# Patient Record
Sex: Male | Born: 1935 | Race: White | Hispanic: No | Marital: Married | Smoking: Never smoker
Health system: Southern US, Community
[De-identification: ages and names within clinical notes are randomized; demographics above are authoritative.]

## PROBLEM LIST (undated history)

## (undated) DIAGNOSIS — J302 Other seasonal allergic rhinitis: Secondary | ICD-10-CM

## (undated) DIAGNOSIS — E785 Hyperlipidemia, unspecified: Secondary | ICD-10-CM

## (undated) DIAGNOSIS — B019 Varicella without complication: Secondary | ICD-10-CM

## (undated) DIAGNOSIS — J189 Pneumonia, unspecified organism: Secondary | ICD-10-CM

## (undated) DIAGNOSIS — M1990 Unspecified osteoarthritis, unspecified site: Secondary | ICD-10-CM

## (undated) HISTORY — PX: TONSILLECTOMY: SUR1361

## (undated) HISTORY — DX: Unspecified osteoarthritis, unspecified site: M19.90

## (undated) HISTORY — DX: Other seasonal allergic rhinitis: J30.2

## (undated) HISTORY — DX: Pneumonia, unspecified organism: J18.9

## (undated) HISTORY — DX: Hyperlipidemia, unspecified: E78.5

## (undated) HISTORY — DX: Varicella without complication: B01.9

---

## 2012-04-18 DIAGNOSIS — H2510 Age-related nuclear cataract, unspecified eye: Secondary | ICD-10-CM | POA: Diagnosis not present

## 2012-07-18 DIAGNOSIS — L259 Unspecified contact dermatitis, unspecified cause: Secondary | ICD-10-CM | POA: Diagnosis not present

## 2012-07-18 DIAGNOSIS — L821 Other seborrheic keratosis: Secondary | ICD-10-CM | POA: Diagnosis not present

## 2012-09-11 DIAGNOSIS — Z23 Encounter for immunization: Secondary | ICD-10-CM | POA: Diagnosis not present

## 2012-09-19 DIAGNOSIS — M109 Gout, unspecified: Secondary | ICD-10-CM | POA: Diagnosis not present

## 2012-09-19 DIAGNOSIS — E7800 Pure hypercholesterolemia, unspecified: Secondary | ICD-10-CM | POA: Diagnosis not present

## 2013-04-07 DIAGNOSIS — H2510 Age-related nuclear cataract, unspecified eye: Secondary | ICD-10-CM | POA: Diagnosis not present

## 2013-06-10 DIAGNOSIS — R21 Rash and other nonspecific skin eruption: Secondary | ICD-10-CM | POA: Diagnosis not present

## 2013-08-29 DIAGNOSIS — Z23 Encounter for immunization: Secondary | ICD-10-CM | POA: Diagnosis not present

## 2013-09-01 DIAGNOSIS — Z85828 Personal history of other malignant neoplasm of skin: Secondary | ICD-10-CM | POA: Diagnosis not present

## 2013-09-01 DIAGNOSIS — D485 Neoplasm of uncertain behavior of skin: Secondary | ICD-10-CM | POA: Diagnosis not present

## 2013-09-01 DIAGNOSIS — C44320 Squamous cell carcinoma of skin of unspecified parts of face: Secondary | ICD-10-CM | POA: Diagnosis not present

## 2013-09-01 DIAGNOSIS — L570 Actinic keratosis: Secondary | ICD-10-CM | POA: Diagnosis not present

## 2013-09-01 DIAGNOSIS — L821 Other seborrheic keratosis: Secondary | ICD-10-CM | POA: Diagnosis not present

## 2013-09-01 DIAGNOSIS — D1801 Hemangioma of skin and subcutaneous tissue: Secondary | ICD-10-CM | POA: Diagnosis not present

## 2013-09-08 DIAGNOSIS — Z85828 Personal history of other malignant neoplasm of skin: Secondary | ICD-10-CM | POA: Diagnosis not present

## 2013-09-08 DIAGNOSIS — C44320 Squamous cell carcinoma of skin of unspecified parts of face: Secondary | ICD-10-CM | POA: Diagnosis not present

## 2013-10-07 DIAGNOSIS — R198 Other specified symptoms and signs involving the digestive system and abdomen: Secondary | ICD-10-CM | POA: Diagnosis not present

## 2013-10-07 DIAGNOSIS — R110 Nausea: Secondary | ICD-10-CM | POA: Diagnosis not present

## 2013-10-07 DIAGNOSIS — K219 Gastro-esophageal reflux disease without esophagitis: Secondary | ICD-10-CM | POA: Diagnosis not present

## 2013-10-07 DIAGNOSIS — Z1211 Encounter for screening for malignant neoplasm of colon: Secondary | ICD-10-CM | POA: Diagnosis not present

## 2013-10-07 DIAGNOSIS — K5900 Constipation, unspecified: Secondary | ICD-10-CM | POA: Diagnosis not present

## 2013-10-30 DIAGNOSIS — K219 Gastro-esophageal reflux disease without esophagitis: Secondary | ICD-10-CM | POA: Diagnosis not present

## 2013-10-30 DIAGNOSIS — Z1211 Encounter for screening for malignant neoplasm of colon: Secondary | ICD-10-CM | POA: Diagnosis not present

## 2013-10-30 DIAGNOSIS — R198 Other specified symptoms and signs involving the digestive system and abdomen: Secondary | ICD-10-CM | POA: Diagnosis not present

## 2013-10-30 DIAGNOSIS — K449 Diaphragmatic hernia without obstruction or gangrene: Secondary | ICD-10-CM | POA: Diagnosis not present

## 2013-10-30 DIAGNOSIS — R110 Nausea: Secondary | ICD-10-CM | POA: Diagnosis not present

## 2013-12-23 DIAGNOSIS — M109 Gout, unspecified: Secondary | ICD-10-CM | POA: Diagnosis not present

## 2014-03-16 DIAGNOSIS — L821 Other seborrheic keratosis: Secondary | ICD-10-CM | POA: Diagnosis not present

## 2014-03-16 DIAGNOSIS — D1801 Hemangioma of skin and subcutaneous tissue: Secondary | ICD-10-CM | POA: Diagnosis not present

## 2014-03-16 DIAGNOSIS — Z85828 Personal history of other malignant neoplasm of skin: Secondary | ICD-10-CM | POA: Diagnosis not present

## 2014-03-27 DIAGNOSIS — H2510 Age-related nuclear cataract, unspecified eye: Secondary | ICD-10-CM | POA: Diagnosis not present

## 2014-03-27 DIAGNOSIS — H3530 Unspecified macular degeneration: Secondary | ICD-10-CM | POA: Diagnosis not present

## 2014-06-01 DIAGNOSIS — B351 Tinea unguium: Secondary | ICD-10-CM | POA: Diagnosis not present

## 2014-06-01 DIAGNOSIS — R338 Other retention of urine: Secondary | ICD-10-CM | POA: Diagnosis not present

## 2014-06-01 DIAGNOSIS — N410 Acute prostatitis: Secondary | ICD-10-CM | POA: Diagnosis not present

## 2015-03-15 DIAGNOSIS — Z85828 Personal history of other malignant neoplasm of skin: Secondary | ICD-10-CM | POA: Diagnosis not present

## 2015-03-15 DIAGNOSIS — D1801 Hemangioma of skin and subcutaneous tissue: Secondary | ICD-10-CM | POA: Diagnosis not present

## 2015-03-15 DIAGNOSIS — L812 Freckles: Secondary | ICD-10-CM | POA: Diagnosis not present

## 2015-03-15 DIAGNOSIS — L821 Other seborrheic keratosis: Secondary | ICD-10-CM | POA: Diagnosis not present

## 2015-03-15 DIAGNOSIS — L570 Actinic keratosis: Secondary | ICD-10-CM | POA: Diagnosis not present

## 2015-03-15 DIAGNOSIS — D692 Other nonthrombocytopenic purpura: Secondary | ICD-10-CM | POA: Diagnosis not present

## 2015-03-31 DIAGNOSIS — T1511XA Foreign body in conjunctival sac, right eye, initial encounter: Secondary | ICD-10-CM | POA: Diagnosis not present

## 2015-03-31 DIAGNOSIS — H2513 Age-related nuclear cataract, bilateral: Secondary | ICD-10-CM | POA: Diagnosis not present

## 2015-07-12 DIAGNOSIS — Z85828 Personal history of other malignant neoplasm of skin: Secondary | ICD-10-CM | POA: Diagnosis not present

## 2015-07-12 DIAGNOSIS — L239 Allergic contact dermatitis, unspecified cause: Secondary | ICD-10-CM | POA: Diagnosis not present

## 2015-08-13 DIAGNOSIS — Z85828 Personal history of other malignant neoplasm of skin: Secondary | ICD-10-CM | POA: Diagnosis not present

## 2015-08-13 DIAGNOSIS — L259 Unspecified contact dermatitis, unspecified cause: Secondary | ICD-10-CM | POA: Diagnosis not present

## 2015-08-13 DIAGNOSIS — L308 Other specified dermatitis: Secondary | ICD-10-CM | POA: Diagnosis not present

## 2015-08-27 DIAGNOSIS — Z85828 Personal history of other malignant neoplasm of skin: Secondary | ICD-10-CM | POA: Diagnosis not present

## 2015-08-30 DIAGNOSIS — Z23 Encounter for immunization: Secondary | ICD-10-CM | POA: Diagnosis not present

## 2015-11-17 DIAGNOSIS — H532 Diplopia: Secondary | ICD-10-CM | POA: Diagnosis not present

## 2015-11-18 ENCOUNTER — Ambulatory Visit (INDEPENDENT_AMBULATORY_CARE_PROVIDER_SITE_OTHER): Payer: Medicare Other | Admitting: Neurology

## 2015-11-18 ENCOUNTER — Encounter: Payer: Self-pay | Admitting: Neurology

## 2015-11-18 VITALS — BP 129/67 | HR 63 | Ht 70.0 in | Wt 181.4 lb

## 2015-11-18 DIAGNOSIS — H5711 Ocular pain, right eye: Secondary | ICD-10-CM

## 2015-11-18 DIAGNOSIS — H532 Diplopia: Secondary | ICD-10-CM

## 2015-11-18 DIAGNOSIS — R519 Headache, unspecified: Secondary | ICD-10-CM

## 2015-11-18 DIAGNOSIS — R51 Headache: Secondary | ICD-10-CM

## 2015-11-18 NOTE — Patient Instructions (Signed)
Overall you are doing fairly well but I do want to suggest a few things today:   Remember to drink plenty of fluid, eat healthy meals and do not skip any meals. Try to eat protein with a every meal and eat a healthy snack such as fruit or nuts in between meals. Try to keep a regular sleep-wake schedule and try to exercise daily, particularly in the form of walking, 20-30 minutes a day, if you can.   As far as diagnostic testing: MRi of the brain, labs  I would like to see you back in 3 months, sooner if we need to. Please call us with any interim questions, concerns, problems, updates or refill requests.   Our phone number is [phone]. We also have an after hours call service for urgent matters and there is a physician on-call for urgent questions. For any emergencies you know to call 911 or go to the nearest emergency room

## 2015-11-18 NOTE — Progress Notes (Signed)
[facility]    Provider:  Dr Jaynee Eagles Referring Provider: Melissa Noon, OD Primary Care Physician:  Woody Seller, MD  CC:  diplopia  HPI:  Allen Robinson is a 81 y.o. male here as a referral from Dr. Delman Cheadle for double vision. PMHx HTN. He woke up with double vision. He woke up with it yesterday morning. No inciting events, no head trauma, no falls. He has discomfort on the top of the eye that started yesterday. One on top of the other images. Maybe better today? Or maybe he is used to it. Continous. If he moves his head to a certain positon it resolves it. Only with both eyes open. No pain with eye movements. No headache. One on top of the other. Better with certain positions. He has some ear pain which is chronic No jaw pain. A little temple pain on the right. No other focal neurologic deficits.  Reviewed notes, labs and imaging from outside physicians, which showed: Reviewed notes from ophthalmology which showed OD 20/25 OS 20/20. He is on simvastatin and aspirin. No diabetes and no glaucoma. Oriented 3. Negative fatigable test, did not suspect myasthenia gravis.   Review of Systems: Patient complains of symptoms per HPI as well as the following symptoms: Double vision, eye pain, hearing loss. Pertinent negatives per HPI. All others negative.   Social History   Social History  . Marital Status: Married    Spouse Name: Allen Robinson  . Number of Children: 2  . Years of Education: 16   Occupational History  . Not on file.   Social History Main Topics  . Smoking status: Never Smoker   . Smokeless tobacco: Not on file  . Alcohol Use: 0.0 oz/week    0 Standard drinks or equivalent per week     Comment: socially  . Drug Use: No  . Sexual Activity: Not on file   Other Topics Concern  . Not on file   Social History Narrative   Lives with wife   Caffeine use: 1 cup coffee in the morning    Family History  Problem Relation Age of Onset  . Stroke Neg Hx   .  Neuropathy Neg Hx     Past Medical History  Diagnosis Date  . Hypertension     Past Surgical History  Procedure Laterality Date  . No surgical history      Current Outpatient Prescriptions  Medication Sig Dispense Refill  . aspirin 81 MG tablet Take 81 mg by mouth daily.    . simvastatin (ZOCOR) 20 MG tablet Take 20 mg by mouth daily.     No current facility-administered medications for this visit.    Allergies as of 11/18/2015  . (No Known Allergies)    Vitals: BP 129/67 mmHg  Pulse 63  Ht '5\' 10"'  (1.778 m)  Wt 181 lb 6.4 oz (82.283 kg)  BMI 26.03 kg/m2 Last Weight:  Wt Readings from Last 1 Encounters:  11/18/15 181 lb 6.4 oz (82.283 kg)   Last Height:   Ht Readings from Last 1 Encounters:  11/18/15 '5\' 10"'  (1.778 m)    Physical exam: Exam: Gen: NAD, conversant, well nourised, well groomed                     CV: RRR, no MRG. No Carotid Bruits. No peripheral edema, warm, nontender Eyes: Conjunctivae clear without exudates or hemorrhage  Neuro: Detailed Neurologic Exam  Speech:    Speech is normal; fluent and spontaneous with  normal comprehension.  Cognition:    The patient is oriented to person, place, and time;     recent and remote memory intact;     language fluent;     normal attention, concentration,     fund of knowledge Cranial Nerves:    The pupils are equal, round, and reactive to light. The fundi are normal and spontaneous venous pulsations are present. Visual fields are full to finger confrontation.  Right impaired CN4. Trigeminal sensation is intact and the muscles of mastication are normal. The face is symmetric. The palate elevates in the midline. Hearing impaired . Voice is normal. Shoulder shrug is normal. The tongue has normal motion without fasciculations.   Coordination:    Normal finger to nose and heel to shin. Normal rapid alternating movements.   Gait:    Heel-toe and tandem gait are normal.   Motor Observation:    No  asymmetry, no atrophy, and no involuntary movements noted. Tone:    Normal muscle tone.    Posture:    Posture is normal. normal erect    Strength:    Strength is V/V in the upper and lower limbs.      Sensation: intact to LT     Reflex Exam:  DTR's:    Deep tendon reflexes in the upper and lower extremities are normal bilaterally.   Toes:    The toes are downgoing bilaterally.   Clonus:    Clonus is absent.     Assessment/Plan:  27 81 year old male with acute onset diplopia, vertigo. Exam shows impaired cranial nerve IV. We'll order MRI of the brain to further evaluate for ischemic events or other intracranial abnormalities. We'll also order CRP and ESR symptoms are not fatigable or worse during certain times of the day. Do not suspect myasthenia gravis. Etiology of CN 4 lesions are usually traumatic(denies) or idiopathic and often resolve on their own.    Sarina Ill, MD  [facility] [address] [place], [place] [address]  Phone [phone] Fax [phone]

## 2015-11-19 ENCOUNTER — Encounter: Payer: Self-pay | Admitting: Neurology

## 2015-11-19 DIAGNOSIS — H532 Diplopia: Secondary | ICD-10-CM | POA: Insufficient documentation

## 2015-11-19 LAB — BASIC METABOLIC PANEL
BUN / CREAT RATIO: 21 (ref 10–22)
BUN: 23 mg/dL (ref 8–27)
CO2: 24 mmol/L (ref 18–29)
CREATININE: 1.12 mg/dL (ref 0.76–1.27)
Calcium: 9.2 mg/dL (ref 8.6–10.2)
Chloride: 103 mmol/L (ref 96–106)
GFR, EST AFRICAN AMERICAN: 68 mL/min/{1.73_m2} (ref >59)
GFR, EST NON AFRICAN AMERICAN: 59 mL/min/{1.73_m2} — AB (ref >59)
GLUCOSE: 123 mg/dL — AB (ref 65–99)
Potassium: 4.5 mmol/L (ref 3.5–5.2)
Sodium: 143 mmol/L (ref 134–144)

## 2015-11-19 LAB — SEDIMENTATION RATE: Sed Rate: 2 mm/hr (ref 0–30)

## 2015-11-19 LAB — C-REACTIVE PROTEIN: CRP: 0.6 mg/L (ref 0.0–4.9)

## 2015-11-23 ENCOUNTER — Telehealth: Payer: Self-pay | Admitting: *Deleted

## 2015-11-23 NOTE — Telephone Encounter (Signed)
-----   Message from Melvenia Beam, MD sent at 11/19/2015  8:41 PM EST ----- Labs were unremarkable thanks

## 2015-11-23 NOTE — Telephone Encounter (Signed)
LVM for pt to call about results. Ok to inform pt labs unremarkable per Dr Jaynee Eagles. Gave [facility] phone number.

## 2015-11-23 NOTE — Telephone Encounter (Signed)
Patient returned Emma's call, patient advised labs unremarkable.

## 2015-11-30 ENCOUNTER — Ambulatory Visit
Admission: RE | Admit: 2015-11-30 | Discharge: 2015-11-30 | Disposition: A | Discharge status: Home / Self Care | Payer: Medicare Other | Source: Ambulatory Visit | Attending: Neurology | Admitting: Neurology

## 2015-11-30 DIAGNOSIS — H5711 Ocular pain, right eye: Secondary | ICD-10-CM | POA: Diagnosis not present

## 2015-11-30 DIAGNOSIS — H532 Diplopia: Secondary | ICD-10-CM

## 2015-11-30 DIAGNOSIS — R51 Headache: Secondary | ICD-10-CM | POA: Diagnosis not present

## 2015-11-30 DIAGNOSIS — R519 Headache, unspecified: Secondary | ICD-10-CM

## 2015-11-30 MED ORDER — GADOBENATE DIMEGLUMINE 529 MG/ML IV SOLN
16.0000 mL | Freq: Once | INTRAVENOUS | Status: AC | PRN
  Administered 2015-11-30: 16 mL via INTRAVENOUS

## 2015-12-03 ENCOUNTER — Telehealth: Payer: Self-pay | Admitting: *Deleted

## 2015-12-03 NOTE — Telephone Encounter (Signed)
-----   Message from Melvenia Beam, MD sent at 12/03/2015 12:52 PM EST ----- Brain is normal for age thanks

## 2015-12-03 NOTE — Telephone Encounter (Signed)
Called pt and relayed results per Dr Jaynee Eagles note. Pt verbalized understanding. Verified upcoming appt on 02/16/16 for check in 915am. He is aware.

## 2016-02-16 ENCOUNTER — Ambulatory Visit (INDEPENDENT_AMBULATORY_CARE_PROVIDER_SITE_OTHER): Payer: Medicare Other | Admitting: Neurology

## 2016-02-16 ENCOUNTER — Encounter: Payer: Self-pay | Admitting: Neurology

## 2016-02-16 VITALS — BP 148/70 | HR 65 | Ht 70.0 in | Wt 181.0 lb

## 2016-02-16 DIAGNOSIS — E538 Deficiency of other specified B group vitamins: Secondary | ICD-10-CM | POA: Diagnosis not present

## 2016-02-16 DIAGNOSIS — R2689 Other abnormalities of gait and mobility: Secondary | ICD-10-CM

## 2016-02-16 DIAGNOSIS — R7309 Other abnormal glucose: Secondary | ICD-10-CM

## 2016-02-16 DIAGNOSIS — G629 Polyneuropathy, unspecified: Secondary | ICD-10-CM | POA: Insufficient documentation

## 2016-02-16 DIAGNOSIS — Z79899 Other long term (current) drug therapy: Secondary | ICD-10-CM | POA: Diagnosis not present

## 2016-02-16 DIAGNOSIS — W19XXXA Unspecified fall, initial encounter: Secondary | ICD-10-CM

## 2016-02-16 NOTE — Patient Instructions (Signed)
Remember to drink plenty of fluid, eat healthy meals and do not skip any meals. Try to eat protein with a every meal and eat a healthy snack such as fruit or nuts in between meals. Try to keep a regular sleep-wake schedule and try to exercise daily, particularly in the form of walking, 20-30 minutes a day, if you can.   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 Please call us with any interim questions, concerns, problems, updates or refill requests.   Our phone number is [phone]. We also have an after hours call service for urgent matters and there is a physician on-call for urgent questions. For any emergencies you know to call 911 or go to the nearest emergency room

## 2016-02-16 NOTE — Progress Notes (Signed)
[facility]    Provider:  Dr Jaynee Eagles Referring Provider: Christain Sacramento, MD Primary Care Physician:  Woody Seller, MD  Provider: Dr Jaynee Eagles Referring Provider: Melissa Noon, [place] Primary Care Physician: Woody Seller, MD  CC: diplopia  Here for follow up on cranial nerve 4 impairment 02/16/2016: MRI was unremarkable. ESr and CRP were nml. Diplopia is improved. He has balance problems, he has fallen. He denies any numbness or tingling in the toes. Imbalance when eyes closed like in the shower. falls 4-5 times tripping up the stairs. He has PT scheduled at the [facility] for gait and safety.   FINDINGS:  No abnormal lesions are seen on diffusion-weighted views to suggest acute ischemia. The cortical sulci, fissures and cisterns are notable for mild perisylvian atrophy. Lateral, third and fourth ventricle are normal in size and appearance. No extra-axial fluid collections are seen. No evidence of mass effect or midline shift. No abnormal lesions are seen on post contrast views. BMP were unremarkable.  Scattered periventricular, subcortical and juxtacortical foci of chronic small vessel ischemic disease.   On sagittal views the posterior fossa, pituitary gland and corpus callosum are unremarkable. No evidence of intracranial hemorrhage on SWI views. The orbits and their contents, paranasal sinuses and calvarium are unremarkable. Intracranial flow voids are present.   IMPRESSION:  Abnormal MRI brain (with and without) demonstrating: 1. Mild scattered periventricular, subcortical and juxtacortical foci of chronic small vessel ischemic disease.  2. Mild perisylvian atrophy. 3. No acute findings.  HPI: Allen Robinson is a 82 y.o. male here as a referral from Dr. Delman Cheadle for double vision. PMHx HTN. He woke up with double vision. He woke up with it yesterday morning. No inciting events, no head trauma, no falls. He has discomfort on the top of the eye that started  yesterday. One on top of the other images. Maybe better today? Or maybe he is used to it. Continous. If he moves his head to a certain positon it resolves it. Only with both eyes open. No pain with eye movements. No headache. One on top of the other. Better with certain positions. He has some ear pain which is chronic No jaw pain. A little temple pain on the right. No other focal neurologic deficits.  Reviewed notes, labs and imaging from outside physicians, which showed: Reviewed notes from ophthalmology which showed OD 20/25 OS 20/20. He is on simvastatin and aspirin. No diabetes and no glaucoma. Oriented 3. Negative fatigable test, did not suspect myasthenia gravis.   Review of Systems: Patient complains of symptoms per HPI as well as the following symptoms: Double vision, eye pain, hearing loss. Pertinent negatives per HPI. All others negative.  Social History   Social History  . Marital Status: Married    Spouse Name: [name]  . Number of Children: 2  . Years of Education: 16   Occupational History  . Not on file.   Social History Main Topics  . Smoking status: Never Smoker   . Smokeless tobacco: Not on file  . Alcohol Use: 0.0 oz/week    0 Standard drinks or equivalent per week     Comment: socially  . Drug Use: No  . Sexual Activity: Not on file   Other Topics Concern  . Not on file   Social History Narrative   Lives with wife   Caffeine use: 1 cup coffee in the morning    Family History  Problem Relation Age of Onset  . Stroke Neg Hx   .  Neuropathy Neg Hx     Past Medical History  Diagnosis Date  . Hypertension     Past Surgical History  Procedure Laterality Date  . No surgical history      Current Outpatient Prescriptions  Medication Sig Dispense Refill  . aspirin 81 MG tablet Take 81 mg by mouth daily.    . simvastatin (ZOCOR) 20 MG tablet Take 20 mg by mouth daily.     No current facility-administered medications for this visit.    Allergies as of  02/16/2016  . (No Known Allergies)    Vitals: BP 148/70 mmHg  Pulse 65  Ht '5\' 10"'  (1.778 m)  Wt 181 lb (82.101 kg)  BMI 25.97 kg/m2  SpO2 96% Last Weight:  Wt Readings from Last 1 Encounters:  02/16/16 181 lb (82.101 kg)   Last Height:   Ht Readings from Last 1 Encounters:  02/16/16 '5\' 10"'  (1.778 m)    Physical exam: Exam: Gen: NAD, conversant, well nourised, well groomed  CV: RRR, no MRG. No Carotid Bruits. No peripheral edema, warm, nontender Eyes: Conjunctivae clear without exudates or hemorrhage  Neuro: Detailed Neurologic Exam  Speech:  Speech is normal; fluent and spontaneous with normal comprehension.  Cognition:  The patient is oriented to person, place, and time;   recent and remote memory intact;   language fluent;   normal attention, concentration,   fund of knowledge Cranial Nerves:  The pupils are equal, round, and reactive to light. The fundi are normal and spontaneous venous pulsations are present. Visual fields are full to finger confrontation.  Right impaired CN4. Trigeminal sensation is intact and the muscles of mastication are normal. The face is symmetric. The palate elevates in the midline. Hearing impaired . Voice is normal. Shoulder shrug is normal. The tongue has normal motion without fasciculations.   Coordination:  Normal finger to nose and heel to shin. Normal rapid alternating movements.   Gait:  Heel-toe intact. Difficulty with tandem and mild imbalance. Good stride and turning  Motor Observation:  No asymmetry, no atrophy, and mild postural tremor high frequency low amplitude.  Tone:  Normal muscle tone.   Sensation: sway on romberg testing.   Posture:  Mildly stooped   Strength:  Strength is V/V in the upper and lower limbs.    Sensation: Impaired pin prick, temp and vibration, proprioception distally in the feet   Reflex Exam:  DTR's: Absent AJs otherwise deep  tendon reflexes in the upper and lower extremities are normal bilaterally.  Toes:  The toes are downgoing bilaterally.  Clonus:  Clonus is absent.    Assessment/Plan: [age] year old male with acute onset diplopia, vertigo. Exam shows impaired cranial nerve IV. We'll order MRI of the brain to further evaluate for ischemic events or other intracranial abnormalities. We'll also order CRP and ESR symptoms are not fatigable or worse during certain times of the day. Do not suspect myasthenia gravis. Etiology of CN 4 lesions are usually traumatic(denies) or idiopathic and often resolve on their own. It has resolved.  Today new problem, balance and falls. He has small and large sensation fiber loss distally in the feet. Keep lights on at night. Serum neuropathy panel today. Fall risk. He has PT scheduled.    Sarina Ill, MD  [facility] [address] [place], [place] [address]  Phone [phone] Fax [phone]  A total of 40  minutes was spent face-to-face with this patient. Over half this time was spent on counseling patient on the  cranial nerve 4 neuropathy a,d imbalance.  diagnosis and different diagnostic and therapeutic options available.

## 2016-02-21 LAB — ANA W/REFLEX: ANA: NEGATIVE

## 2016-02-21 LAB — MULTIPLE MYELOMA PANEL, SERUM
ALPHA2 GLOB SERPL ELPH-MCNC: 0.9 g/dL (ref 0.4–1.0)
Albumin SerPl Elph-Mcnc: 3.9 g/dL (ref 2.9–4.4)
Albumin/Glob SerPl: 1.5 (ref 0.7–1.7)
Alpha 1: 0.2 g/dL (ref 0.0–0.4)
B-Globulin SerPl Elph-Mcnc: 1 g/dL (ref 0.7–1.3)
Gamma Glob SerPl Elph-Mcnc: 0.6 g/dL (ref 0.4–1.8)
Globulin, Total: 2.7 g/dL (ref 2.2–3.9)
IGG (IMMUNOGLOBIN G), SERUM: 705 mg/dL (ref 700–1600)
IGM (IMMUNOGLOBULIN M), SRM: 41 mg/dL (ref 15–143)
IgA/Immunoglobulin A, Serum: 196 mg/dL (ref 61–437)
TOTAL PROTEIN: 6.6 g/dL (ref 6.0–8.5)

## 2016-02-21 LAB — SJOGREN'S SYNDROME ANTIBODS(SSA + SSB)
ENA SSA (RO) Ab: <0.2 AI (ref 0.0–0.9)
ENA SSB (LA) Ab: <0.2 AI (ref 0.0–0.9)

## 2016-02-21 LAB — HEMOGLOBIN A1C
ESTIMATED AVERAGE GLUCOSE: 131 mg/dL
HEMOGLOBIN A1C: 6.2 % — AB (ref 4.8–5.6)

## 2016-02-21 LAB — TSH: TSH: 4.11 u[IU]/mL (ref 0.450–4.500)

## 2016-02-21 LAB — B12 AND FOLATE PANEL
Folate: >20 ng/mL (ref >3.0)
Vitamin B-12: 379 pg/mL (ref 211–946)

## 2016-02-22 ENCOUNTER — Telehealth: Payer: Self-pay | Admitting: *Deleted

## 2016-02-22 NOTE — Telephone Encounter (Signed)
-----   Message from Melvenia Beam, MD sent at 02/21/2016  5:12 PM EDT ----- Hgba1c is 6.2, he is almost diabetic. Would address with primary care and change diet with exercise. His B12 was 379 which is normal but iin the low range (range 211-946). Recommend taking some b12 supplement daily 10109mcg a day thanks. Low B12 can cause balance problems. thanks

## 2016-02-22 NOTE — Telephone Encounter (Signed)
Called and spoke to pt about lab results per Dr Jaynee Eagles note. He will pick up Vit B12 1067mcg and start taking per Dr Jaynee Eagles recommendation. Added to pt med list. He will f/u with PCP as well.   Advised him to limit amount of carbs/sugars he is consuming and to try and get daily exercise. He verbalized understanding.

## 2016-03-20 DIAGNOSIS — L570 Actinic keratosis: Secondary | ICD-10-CM | POA: Diagnosis not present

## 2016-03-20 DIAGNOSIS — Z85828 Personal history of other malignant neoplasm of skin: Secondary | ICD-10-CM | POA: Diagnosis not present

## 2016-03-20 DIAGNOSIS — L821 Other seborrheic keratosis: Secondary | ICD-10-CM | POA: Diagnosis not present

## 2016-03-20 DIAGNOSIS — D1801 Hemangioma of skin and subcutaneous tissue: Secondary | ICD-10-CM | POA: Diagnosis not present

## 2016-03-20 DIAGNOSIS — C4441 Basal cell carcinoma of skin of scalp and neck: Secondary | ICD-10-CM | POA: Diagnosis not present

## 2016-03-20 DIAGNOSIS — D485 Neoplasm of uncertain behavior of skin: Secondary | ICD-10-CM | POA: Diagnosis not present

## 2016-03-20 DIAGNOSIS — L918 Other hypertrophic disorders of the skin: Secondary | ICD-10-CM | POA: Diagnosis not present

## 2016-04-12 DIAGNOSIS — C44212 Basal cell carcinoma of skin of right ear and external auricular canal: Secondary | ICD-10-CM | POA: Diagnosis not present

## 2016-04-12 DIAGNOSIS — Z85828 Personal history of other malignant neoplasm of skin: Secondary | ICD-10-CM | POA: Diagnosis not present

## 2016-06-01 DIAGNOSIS — H2513 Age-related nuclear cataract, bilateral: Secondary | ICD-10-CM | POA: Diagnosis not present

## 2016-08-17 ENCOUNTER — Ambulatory Visit (INDEPENDENT_AMBULATORY_CARE_PROVIDER_SITE_OTHER): Payer: Medicare Other | Admitting: Neurology

## 2016-08-17 ENCOUNTER — Encounter: Payer: Self-pay | Admitting: Neurology

## 2016-08-17 VITALS — BP 127/65 | HR 59 | Ht 70.0 in | Wt 175.8 lb

## 2016-08-17 DIAGNOSIS — R2689 Other abnormalities of gait and mobility: Secondary | ICD-10-CM | POA: Diagnosis not present

## 2016-08-17 NOTE — Progress Notes (Signed)
[facility]    Provider:  Dr Jaynee Eagles Referring Provider: Christain Sacramento, MD Primary Care Physician:  Woody Seller, MD   CC: diplopia  Interval history 08/17/2016:  Discussed with patient, HgbA1c 6.2 which can cause neuropathy in the feet. Otherwise serum neuropathy panel unremarkable. He is doing very well. His feet are sore. He has balance problems. He wen to physical therapy and he says he did not get anything out of it. He refuses to use a walking aid. Double vision is gone. Hi back is huring. He feels shuffling. hurs on the low back (points to the lumbar paraspinals). He is very active, does a lot of lawn work. I advised yoga or silver sneakers.  Here for follow up on cranial nerve 4 impairment 02/16/2016: MRI was unremarkable. ESr and CRP were nml. Diplopia is improved. He has balance problems, he has fallen. He denies any numbness or tingling in the toes. Imbalance when eyes closed like in the shower. falls 4-5 times tripping up the stairs. He has PT scheduled at the [facility] for gait and safety.   FINDINGS:  No abnormal lesions are seen on diffusion-weighted views to suggest acute ischemia. The cortical sulci, fissures and cisterns are notable for mild perisylvian atrophy. Lateral, third and fourth ventricle are normal in size and appearance. No extra-axial fluid collections are seen. No evidence of mass effect or midline shift. No abnormal lesions are seen on post contrast views. BMP were unremarkable.  Scattered periventricular, subcortical and juxtacortical foci of chronic small vessel ischemic disease.   On sagittal views the posterior fossa, pituitary gland and corpus callosum are unremarkable. No evidence of intracranial hemorrhage on SWI views. The orbits and their contents, paranasal sinuses and calvarium are unremarkable. Intracranial flow voids are present.   IMPRESSION:  Abnormal MRI brain (with and without) demonstrating: 1. Mild scattered  periventricular, subcortical and juxtacortical foci of chronic small vessel ischemic disease.  2. Mild perisylvian atrophy. 3. No acute findings.  HPI: Allen Robinson is a 82 y.o. male here as a referral from Dr. Delman Cheadle for double vision. PMHx HTN. He woke up with double vision. He woke up with it yesterday morning. No inciting events, no head trauma, no falls. He has discomfort on the top of the eye that started yesterday. One on top of the other images. Maybe better today? Or maybe he is used to it. Continous. If he moves his head to a certain positon it resolves it. Only with both eyes open. No pain with eye movements. No headache. One on top of the other. Better with certain positions. He has some ear pain which is chronic No jaw pain. A little temple pain on the right. No other focal neurologic deficits.  Reviewed notes, labs and imaging from outside physicians, which showed: Reviewed notes from ophthalmology which showed OD 20/25 OS 20/20. He is on simvastatin and aspirin. No diabetes and no glaucoma. Oriented 3. Negative fatigable test, did not suspect myasthenia gravis.   Review of Systems: Patient complains of symptoms per HPI as well as the following symptoms: Double vision, eye pain, hearing loss. Pertinent negatives per HPI. All others negative.  Social History   Social History  . Marital status: Married    Spouse name: [name]  . Number of children: 2  . Years of education: 16   Occupational History  . Not on file.   Social History Main Topics  . Smoking status: Never Smoker  . Smokeless tobacco: Never Used  . Alcohol  use 0.0 oz/week     Comment: socially  . Drug use: No  . Sexual activity: Not on file   Other Topics Concern  . Not on file   Social History Narrative   Lives with wife   Caffeine use: 1 cup coffee in the morning    Family History  Problem Relation Age of Onset  . Stroke Neg Hx   . Neuropathy Neg Hx     Past Medical History:  Diagnosis Date    . Hypertension     Past Surgical History:  Procedure Laterality Date  . no surgical history      Current Outpatient Prescriptions  Medication Sig Dispense Refill  . allopurinol (ZYLOPRIM) 100 MG tablet Take 100 mg by mouth daily.    [name] aspirin 81 MG tablet Take 81 mg by mouth daily.    . colchicine 0.6 MG tablet Take 0.6 mg by mouth as needed.    . simvastatin (ZOCOR) 20 MG tablet Take 20 mg by mouth daily.     No current facility-administered medications for this visit.     Allergies as of 08/17/2016  . (No Known Allergies)    Vitals: BP 127/65 (BP Location: Right Arm, Patient Position: Sitting, Cuff Size: Normal)   Pulse (!) 59   Ht '5\' 10"'  (1.778 m)   Wt 175 lb 12.8 oz (79.7 kg)   BMI 25.22 kg/m  Last Weight:  Wt Readings from Last 1 Encounters:  08/17/16 175 lb 12.8 oz (79.7 kg)   Last Height:   Ht Readings from Last 1 Encounters:  08/17/16 '5\' 10"'  (1.778 m)   Physical exam: Exam: Gen: NAD, conversant, well nourised, well groomed  CV: RRR, no MRG. No Carotid Bruits. No peripheral edema, warm, nontender Eyes: Conjunctivae clear without exudates or hemorrhage  Neuro: Detailed Neurologic Exam  Speech:  Speech is normal; fluent and spontaneous with normal comprehension.  Cognition:  The patient is oriented to person, place, and time;   recent and remote memory intact;   language fluent;   normal attention, concentration,   fund of knowledge Cranial Nerves:  The pupils are equal, round, and reactive to light. The fundi are normal and spontaneous venous pulsations are present. Visual fields are full to finger confrontation.  Right impaired CN4. Trigeminal sensation is intact and the muscles of mastication are normal. The face is symmetric. The palate elevates in the midline. Hearing impaired . Voice is normal. Shoulder shrug is normal. The tongue has normal motion without fasciculations.   Coordination:  Normal finger  to nose and heel to shin. Normal rapid alternating movements.   Gait:  Heel-toe intact. Difficulty with tandem and mild imbalance. Good stride and turning  Motor Observation:  No asymmetry, no atrophy, and mild postural tremor high frequency low amplitude.  Tone:  Normal muscle tone.   Sensation: sway on romberg testing.   Posture:  Mildly stooped   Strength:  Strength is V/V in the upper and lower limbs.    Sensation: Impaired pin prick, temp and vibration, proprioception distally in the feet   Reflex Exam:  DTR's: Absent AJs otherwise deep tendon reflexes in the upper and lower extremities are normal bilaterally.  Toes:  The toes are downgoing bilaterally.  Clonus:  Clonus is absent.    Assessment/Plan: [age] year old male with acute onset diplopia, vertigo. Exam showed impaired cranial nerve IV.MRI of the brain to further evaluate for ischemic events or other intracranial abnormalities was unremarkable. CRP and ESR negative. Do not suspect  myasthenia gravis. Etiology of CN 4 lesions are usually traumatic(denies) or idiopathic and often resolve on their own. It has resolved.   Today new problem, balance and falls. He has small and large sensation fiber loss distally in the feet. Keep lights on at night. Discussed with patient, HgbA1c 6.2 which can cause neuropathy in the feet. Otherwise serum neuropathy panel unremarkable.  He wen to physical therapy and he says he did not get anything out of it.  He is very active, does a lot of lawn work. I advised yoga or silver sneakers.   Cc: Francis Gaines, MD  [facility] [address] [place], [place] [address]  Phone [phone] Fax [phone]  A total of 20 minutes was spent face-to-face with this patient. Over half this time was spent on counseling patient on the imbalance diagnosis and different diagnostic and therapeutic options  available.

## 2016-08-17 NOTE — Patient Instructions (Signed)
Remember to drink plenty of fluid, eat healthy meals and do not skip any meals. Try to eat protein with a every meal and eat a healthy snack such as fruit or nuts in between meals. Try to keep a regular sleep-wake schedule and try to exercise daily, particularly in the form of walking, 20-30 minutes a day, if you can.   As far as diagnostic testing: Suggest Silver Sneakers at the [facility]  I would like to see you back as needed, sooner if we need to. Please call us with any interim questions, concerns, problems, updates or refill requests.   Our phone number is [phone]. We also have an after hours call service for urgent matters and there is a physician on-call for urgent questions. For any emergencies you know to call 911 or go to the nearest emergency room

## 2016-09-08 ENCOUNTER — Ambulatory Visit (INDEPENDENT_AMBULATORY_CARE_PROVIDER_SITE_OTHER): Payer: Medicare Other

## 2016-09-08 DIAGNOSIS — Z23 Encounter for immunization: Secondary | ICD-10-CM

## 2016-09-08 NOTE — Patient Instructions (Signed)
Patient came to office visit with wife.He requested a flu shot.Flu shot given.

## 2016-09-26 DIAGNOSIS — J0140 Acute pansinusitis, unspecified: Secondary | ICD-10-CM | POA: Diagnosis not present

## 2016-09-26 DIAGNOSIS — R509 Fever, unspecified: Secondary | ICD-10-CM | POA: Diagnosis not present

## 2016-09-26 DIAGNOSIS — R05 Cough: Secondary | ICD-10-CM | POA: Diagnosis not present

## 2016-10-03 DIAGNOSIS — J0140 Acute pansinusitis, unspecified: Secondary | ICD-10-CM | POA: Diagnosis not present

## 2016-10-03 DIAGNOSIS — J3089 Other allergic rhinitis: Secondary | ICD-10-CM | POA: Diagnosis not present

## 2016-10-13 DIAGNOSIS — J069 Acute upper respiratory infection, unspecified: Secondary | ICD-10-CM | POA: Diagnosis not present

## 2016-11-08 DIAGNOSIS — M109 Gout, unspecified: Secondary | ICD-10-CM | POA: Diagnosis not present

## 2016-11-08 DIAGNOSIS — J40 Bronchitis, not specified as acute or chronic: Secondary | ICD-10-CM | POA: Diagnosis not present

## 2016-11-08 DIAGNOSIS — J22 Unspecified acute lower respiratory infection: Secondary | ICD-10-CM | POA: Diagnosis not present

## 2016-11-08 DIAGNOSIS — R05 Cough: Secondary | ICD-10-CM | POA: Diagnosis not present

## 2016-11-08 DIAGNOSIS — M10079 Idiopathic gout, unspecified ankle and foot: Secondary | ICD-10-CM | POA: Diagnosis not present

## 2016-11-08 DIAGNOSIS — R799 Abnormal finding of blood chemistry, unspecified: Secondary | ICD-10-CM | POA: Diagnosis not present

## 2016-11-22 DIAGNOSIS — M1A0790 Idiopathic chronic gout, unspecified ankle and foot, without tophus (tophi): Secondary | ICD-10-CM | POA: Diagnosis not present

## 2016-11-22 DIAGNOSIS — J189 Pneumonia, unspecified organism: Secondary | ICD-10-CM | POA: Diagnosis not present

## 2016-11-22 DIAGNOSIS — J22 Unspecified acute lower respiratory infection: Secondary | ICD-10-CM | POA: Diagnosis not present

## 2017-01-18 LAB — BASIC METABOLIC PANEL
BUN: 19 mg/dL (ref 4–21)
CREATININE: 0.9 mg/dL (ref <=1.3)
GLUCOSE: 99 mg/dL
POTASSIUM: 4.3 mmol/L (ref 3.4–5.3)
SODIUM: 144 mmol/L (ref 137–147)

## 2017-01-18 LAB — HEPATIC FUNCTION PANEL
ALT: 25 U/L (ref 10–40)
AST: 21 U/L (ref 14–40)
Alkaline Phosphatase: 100 U/L (ref 25–125)
BILIRUBIN, TOTAL: 0.5 mg/dL

## 2017-01-18 LAB — CBC AND DIFFERENTIAL
HCT: 41 % (ref 41–53)
Hemoglobin: 13.7 g/dL (ref 13.5–17.5)
PLATELETS: 171 10*3/uL (ref 150–399)
WBC: 5.2 10^3/mL

## 2017-01-18 LAB — LIPID PANEL
Cholesterol: 132 mg/dL (ref 0–200)
HDL: 47 mg/dL (ref 35–70)
LDL CALC: 72 mg/dL
TRIGLYCERIDES: 65 mg/dL (ref 40–160)

## 2017-01-18 LAB — VITAMIN D 25 HYDROXY (VIT D DEFICIENCY, FRACTURES): Vit D, 25-Hydroxy: 34.89

## 2017-01-18 LAB — VITAMIN B12: VITAMIN B 12: 1061

## 2017-03-14 ENCOUNTER — Ambulatory Visit (INDEPENDENT_AMBULATORY_CARE_PROVIDER_SITE_OTHER): Payer: Medicare Other | Admitting: Family Medicine

## 2017-03-14 ENCOUNTER — Encounter: Payer: Self-pay | Admitting: Family Medicine

## 2017-03-14 VITALS — BP 122/74 | HR 70 | Temp 97.6°F | Ht 68.5 in | Wt 172.0 lb

## 2017-03-14 DIAGNOSIS — E7800 Pure hypercholesterolemia, unspecified: Secondary | ICD-10-CM

## 2017-03-14 DIAGNOSIS — E785 Hyperlipidemia, unspecified: Secondary | ICD-10-CM | POA: Insufficient documentation

## 2017-03-14 DIAGNOSIS — E559 Vitamin D deficiency, unspecified: Secondary | ICD-10-CM

## 2017-03-14 DIAGNOSIS — N401 Enlarged prostate with lower urinary tract symptoms: Secondary | ICD-10-CM

## 2017-03-14 DIAGNOSIS — M1A0790 Idiopathic chronic gout, unspecified ankle and foot, without tophus (tophi): Secondary | ICD-10-CM

## 2017-03-14 DIAGNOSIS — Z0000 Encounter for general adult medical examination without abnormal findings: Secondary | ICD-10-CM | POA: Diagnosis not present

## 2017-03-14 DIAGNOSIS — K219 Gastro-esophageal reflux disease without esophagitis: Secondary | ICD-10-CM

## 2017-03-14 DIAGNOSIS — R351 Nocturia: Secondary | ICD-10-CM | POA: Diagnosis not present

## 2017-03-14 DIAGNOSIS — M109 Gout, unspecified: Secondary | ICD-10-CM | POA: Insufficient documentation

## 2017-03-14 DIAGNOSIS — G629 Polyneuropathy, unspecified: Secondary | ICD-10-CM | POA: Diagnosis not present

## 2017-03-14 NOTE — Assessment & Plan Note (Signed)
S: In 71s last check. Takes 10,000 units daily per record- may only be on 1000 units A/P: regardless of dose he is controlled on that dose so will continue- consider updating this level at next visit and have him bring meds.

## 2017-03-14 NOTE — Assessment & Plan Note (Signed)
S: Finasteride 5mg  from [place]- started due to nocturia. PSA only 1.6 on 01/18/17. Started January 11/2016. Not sure if helping A/P: we reviewed may take 6 months to get full benefit. Flomax may make him lightheaded and increase fall risk so opted not to pursue this.

## 2017-03-14 NOTE — Assessment & Plan Note (Addendum)
S: Age 83 or so started. On allopurinol 100mg  (he states takes 2 pills- starting first of the year) and colchicine prn A/P: appears controlled- continue current medicines

## 2017-03-14 NOTE — Progress Notes (Signed)
Phone: [phone]  Subjective:  Patient presents today to establish care.  Prior patient of Dr. Redmond Pulling of [facility]. also Sees Dr.Burks- [facility]. chief complaint-noted.   See problem oriented charting  The following were reviewed and entered/updated in epic: Past Medical History:  Diagnosis Date  . Arthritis    hands  . Chicken pox   . Hyperlipidemia   . Pneumonia   . Seasonal allergies    Patient Active Problem List   Diagnosis Date Noted  . Gout 03/14/2017    Priority: Medium  . BPH associated with nocturia 03/14/2017    Priority: Medium  . Hyperlipidemia 03/14/2017    Priority: Medium  . GERD (gastroesophageal reflux disease) 03/14/2017    Priority: Medium  . Vitamin D deficiency 03/14/2017    Priority: Low  . Peripheral polyneuropathy 02/16/2016    Priority: Low  . Diplopia 11/19/2015    Priority: Low   Past Surgical History:  Procedure Laterality Date  . TONSILLECTOMY     1936    Family History  Problem Relation Age of Onset  . Heart failure Mother   . Pneumonia Mother     died 31  . Stroke Father     62  . Neuropathy Neg Hx     Medications- reviewed and updated Current Outpatient Prescriptions  Medication Sig Dispense Refill  . allopurinol (ZYLOPRIM) 100 MG tablet Take 100 mg by mouth daily.    [name] aspirin 81 MG tablet Take 81 mg by mouth daily.    . Cholecalciferol (VITAMIN D3) 10000 units TABS Take 1 tablet by mouth daily.    . colchicine 0.6 MG tablet Take 0.6 mg by mouth as needed.    . finasteride (PROSCAR) 5 MG tablet Take 5 mg by mouth daily.    [name] omeprazole (PRILOSEC) 20 MG capsule Take 20 mg by mouth daily.    . simvastatin (ZOCOR) 20 MG tablet Take 20 mg by mouth daily.    . vitamin B-12 (CYANOCOBALAMIN) 500 MCG tablet Take 500 mcg by mouth daily.     No current facility-administered medications for this visit.     Allergies-reviewed and updated No Known Allergies  Social History   Social History  . Marital  status: Married    Spouse name: [name]  . Number of children: 2  . Years of education: 51   Social History Main Topics  . Smoking status: Never Smoker  . Smokeless tobacco: Never Used  . Alcohol use 0.0 oz/week     Comment: no longer drinking. prior social  . Drug use: No  . Sexual activity: Not Asked   Other Topics Concern  . None   Social History Narrative   Married. Lives with wife. 2 children- daughter Freight forwarder (2 daughters one will be a Restaurant manager, fast food), daughter Lodema Hong and never came back home- still at [employer] (1 grandson) so 3 grandkids total.        Fought in [place] war- wife was also in the war and veteran.       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     ROS--Full ROS was completed Review of Systems  Constitutional: Negative for chills and fever.  HENT: Positive for hearing loss (wears aids). Negative for ear discharge and ear pain.   Eyes: Negative for blurred vision and double vision.  Respiratory: Negative for cough and hemoptysis.   Cardiovascular: Negative for chest pain and palpitations.  Gastrointestinal: Positive for heartburn (sparingly). Negative for abdominal pain and  vomiting.  Genitourinary: Positive for frequency and urgency. Negative for flank pain.  Musculoskeletal: Positive for falls and joint pain.  Skin: Negative for itching and rash.  Neurological: Negative for dizziness and headaches.  Endo/Heme/Allergies: Negative for polydipsia. Does not bruise/bleed easily.  Psychiatric/Behavioral: Negative for hallucinations and substance abuse.   Objective: BP 122/74 (BP Location: Left Arm, Patient Position: Sitting, Cuff Size: Large)   Pulse 70   Temp 97.6 F (36.4 C) (Oral)   Ht 5' 8.5" (1.74 m)   Wt 172 lb (78 kg)   SpO2 97%   BMI 25.77 kg/m  Gen: NAD, resting comfortably HEENT: Mucous membranes are moist. Oropharynx normal. Wears hearing aids.  Eyes: sclera and lids normal, PERRLA Neck: no thyromegaly, no cervical  lymphadenopathy CV: RRR no murmurs rubs or gallops Lungs: CTAB no crackles, wheeze, rhonchi Abdomen: soft/nontender/nondistended/normal bowel sounds. No rebound or guarding.  Ext: no edema Skin: warm, dry Neuro: 5/5 strength in upper and lower extremities, normal gai, also se ebelow. Gets onto table without assist and does not appear imbalanced when walking  Assessment/Plan:  Gout S: Age 83 or so started. On allopurinol 100mg  (he states takes 2 pills- starting first of the year) and colchicine prn A/P: appears controlled- continue current medicines  Hyperlipidemia S: controlled on Simvastatin 20mg . LDL 72 on 01/18/17 through [place]. Also on for primary prevention ASA 81 mg - A/P: continue current medications- doing well   GERD (gastroesophageal reflux disease) S: uses prilosec sparingly for spicier meals.  A/P: hew was concerned about side effects- we discussed minimizing use to reduce side effects. Could also mention zantac option since does not use frequently.    BPH associated with nocturia S: Finasteride 5mg  from [place]- started due to nocturia. PSA only 1.6 on 01/18/17. Started January 11/2016. Not sure if helping A/P: we reviewed may take 6 months to get full benefit. Flomax may make him lightheaded and increase fall risk so opted not to pursue this.    Vitamin D deficiency S: In 61s last check. Takes 10,000 units daily per record- may only be on 1000 units A/P: regardless of dose he is controlled on that dose so will continue- consider updating this level at next visit and have him bring meds.    Peripheral polyneuropathy S: patient denies neuropathic pain.  He has been on b12 for some time- wonder if this was perhaps for treatment of neuropathy  That being said- balance has been off for some time. Has been having falls for 3-4 years but seems to be worsening. Cannot stand on one leg. Gait and balance training for 3 months but didn't help. Falls are usually when he is out in the yard  working and he does not have his hands free. No falls on hard surfaces and has not hit head.  O: intact to monofilament and proprioception.  A/P: I advised him to continue work that allows him to balance- such as holding lawnmower handle. Otherwise use cane- he declines at present. Also can discuss with RN Wynetta Fines.   Advised awv today. Advised yearly visit with me at least.    Orders Placed This Encounter  Procedures  . CBC and differential    This external order was created through the Results Console.  [name] VITAMIN D 25 Hydroxy (Vit-D Deficiency, Fractures)    This external order was created through the Results Console.  . Basic metabolic panel    This external order was created through the Results Console.  . Lipid panel  This external order was created through the Results Console.  . Hepatic function panel    This external order was created through the Results Console.  . Vitamin B12    This external order was created through the Results Console.    Meds ordered this encounter  Medications  . Cholecalciferol (VITAMIN D3) 10000 units TABS    Sig: Take 1 tablet by mouth daily.  . vitamin B-12 (CYANOCOBALAMIN) 500 MCG tablet    Sig: Take 500 mcg by mouth daily.  . finasteride (PROSCAR) 5 MG tablet    Sig: Take 5 mg by mouth daily.  [name] omeprazole (PRILOSEC) 20 MG capsule    Sig: Take 20 mg by mouth daily.    Return precautions advised.  Garret Reddish, MD

## 2017-03-14 NOTE — Patient Instructions (Addendum)
I would advise you to consider using a cane   No changes in medications  Lets get a copy of immunizations from the [place]  I want to see you at least once a year here. You could go ahead and schedule for 1 year  _________________________________________________________   Allen Robinson , Thank you for taking time to come for your Medicare Wellness Visit. I appreciate your ongoing commitment to your health goals. Please review the following plan we discussed and let me know if I can assist you in the future.   Check out [facility] for [phone] and ask about tai chi [address] [place],  [address]  [facility]   Will take your tetanus and your pneumonia vaccinations at the [place] Will get a copy of your immunizations     [facility]; [phone]- they can discuss where you can find tai chi classes  Sr. Line; [phone] Get resource to get information on any and all community programs for [facility]: [phone] Community Health Response Program -[phone] Public Health Dept; Need to be a skilled visit but can assist with bathing as well; [phone]     These are the goals we discussed: Goals    . patient          Stay Vertical  Stay active; exercise will help if strength building  Will take interval breaks working in the yard or doing something otherwise strenuous   May try tai chi           This is a list of the screening recommended for you and due dates:  Health Maintenance  Topic Date Due  . Tetanus Vaccine  02/18/1945  . Pneumonia vaccines (1 of 2 - PCV13) 02/17/1993  . Flu Shot  06/20/2017    Health Maintenance, Male A healthy lifestyle and preventive care is important for your health and wellness. Ask your health care provider about what schedule of regular examinations is right for you. What should I know about weight and diet?  Eat a Healthy Diet  Eat plenty of vegetables, fruits,  whole grains, low-fat dairy products, and lean protein.  Do not eat a lot of foods high in solid fats, added sugars, or salt. Maintain a Healthy Weight  Regular exercise can help you achieve or maintain a healthy weight. You should:  Do at least 150 minutes of exercise each week. The exercise should increase your heart rate and make you sweat (moderate-intensity exercise).  Do strength-training exercises at least twice a week. Watch Your Levels of Cholesterol and Blood Lipids  Have your blood tested for lipids and cholesterol every 5 years starting at 81 years of age. If you are at high risk for heart disease, you should start having your blood tested when you are 81 years old. You may need to have your cholesterol levels checked more often if:  Your lipid or cholesterol levels are high.  You are older than 81 years of age.  You are at high risk for heart disease. What should I know about cancer screening? Many types of cancers can be detected early and may often be prevented. Lung Cancer  You should be screened every year for lung cancer if:  You are a current smoker who has smoked for at least 30 years.  You are a former smoker who has quit within the past 15 years.  Talk to your health care provider about your screening options, when you should  start screening, and how often you should be screened. Colorectal Cancer  Routine colorectal cancer screening usually begins at 81 years of age and should be repeated every 5-10 years until you are 81 years old. You may need to be screened more often if early forms of precancerous polyps or small growths are found. Your health care provider may recommend screening at an earlier age if you have risk factors for colon cancer.  Your health care provider may recommend using home test kits to check for hidden blood in the stool.  A small camera at the end of a tube can be used to examine your colon (sigmoidoscopy or colonoscopy). This checks  for the earliest forms of colorectal cancer. Prostate and Testicular Cancer  Depending on your age and overall health, your health care provider may do certain tests to screen for prostate and testicular cancer.  Talk to your health care provider about any symptoms or concerns you have about testicular or prostate cancer. Skin Cancer  Check your skin from head to toe regularly.  Tell your health care provider about any new moles or changes in moles, especially if:  There is a change in a mole's size, shape, or color.  You have a mole that is larger than a pencil eraser.  Always use sunscreen. Apply sunscreen liberally and repeat throughout the day.  Protect yourself by wearing long sleeves, pants, a wide-brimmed hat, and sunglasses when outside. What should I know about heart disease, diabetes, and high blood pressure?  If you are 4-39 years of age, have your blood pressure checked every 3-5 years. If you are 41 years of age or older, have your blood pressure checked every year. You should have your blood pressure measured twice-once when you are at a hospital or clinic, and once when you are not at a hospital or clinic. Record the average of the two measurements. To check your blood pressure when you are not at a hospital or clinic, you can use:  An automated blood pressure machine at a pharmacy.  A home blood pressure monitor.  Talk to your health care provider about your target blood pressure.  If you are between 39-60 years old, ask your health care provider if you should take aspirin to prevent heart disease.  Have regular diabetes screenings by checking your fasting blood sugar level.  If you are at a normal weight and have a low risk for diabetes, have this test once every three years after the age of 27.  If you are overweight and have a high risk for diabetes, consider being tested at a younger age or more often.  A one-time screening for abdominal aortic aneurysm (AAA)  by ultrasound is recommended for men aged 75-75 years who are current or former smokers. What should I know about preventing infection? Hepatitis B  If you have a higher risk for hepatitis B, you should be screened for this virus. Talk with your health care provider to find out if you are at risk for hepatitis B infection. Hepatitis C  Blood testing is recommended for:  Everyone born from [birth year] through 1963.  Anyone with known risk factors for hepatitis C. Sexually Transmitted Diseases (STDs)  You should be screened each year for STDs including gonorrhea and chlamydia if:  You are sexually active and are younger than 81 years of age.  You are older than 81 years of age and your health care provider tells you that you are at risk for this type  of infection.  Your sexual activity has changed since you were last screened and you are at an increased risk for chlamydia or gonorrhea. Ask your health care provider if you are at risk.  Talk with your health care provider about whether you are at high risk of being infected with HIV. Your health care provider may recommend a prescription medicine to help prevent HIV infection. What else can I do?  Schedule regular health, dental, and eye exams.  Stay current with your vaccines (immunizations).  Do not use any tobacco products, such as cigarettes, chewing tobacco, and e-cigarettes. If you need help quitting, ask your health care provider.  Limit alcohol intake to no more than 2 drinks per day. One drink equals 12 ounces of beer, 5 ounces of wine, or 1 ounces of hard liquor.  Do not use street drugs.  Do not share needles.  Ask your health care provider for help if you need support or information about quitting drugs.  Tell your health care provider if you often feel depressed.  Tell your health care provider if you have ever been abused or do not feel safe at home. This information is not intended to replace advice given to you by your  health care provider. Make sure you discuss any questions you have with your health care provider. Document Released: 05/04/2008 Document Revised: 07/05/2016 Document Reviewed: 08/10/2015 Elsevier Interactive Patient Education  2017 [place] Prevention in the Home Falls can cause injuries and can affect people from all age groups. There are many simple things that you can do to make your home safe and to help prevent falls. What can I do on the outside of my home?  Regularly repair the edges of walkways and driveways and fix any cracks.  Remove high doorway thresholds.  Trim any shrubbery on the main path into your home.  Use bright outdoor lighting.  Clear walkways of debris and clutter, including tools and rocks.  Regularly check that handrails are securely fastened and in good repair. Both sides of any steps should have handrails.  Install guardrails along the edges of any raised decks or porches.  Have leaves, snow, and ice cleared regularly.  Use sand or salt on walkways during winter months.  In the garage, clean up any spills right away, including grease or oil spills. What can I do in the bathroom?  Use night lights.  Install grab bars by the toilet and in the tub and shower. Do not use towel bars as grab bars.  Use non-skid mats or decals on the floor of the tub or shower.  If you need to sit down while you are in the shower, use a plastic, non-slip stool.  Keep the floor dry. Immediately clean up any water that spills on the floor.  Remove soap buildup in the tub or shower on a regular basis.  Attach bath mats securely with double-sided non-slip rug tape.  Remove throw rugs and other tripping hazards from the floor. What can I do in the bedroom?  Use night lights.  Make sure that a bedside light is easy to reach.  Do not use oversized bedding that drapes onto the floor.  Have a firm chair that has side arms to use for getting  dressed.  Remove throw rugs and other tripping hazards from the floor. What can I do in the kitchen?  Clean up any spills right away.  Avoid walking on wet floors.  Place frequently used  items in easy-to-reach places.  If you need to reach for something above you, use a sturdy step stool that has a grab bar.  Keep electrical cables out of the way.  Do not use floor polish or wax that makes floors slippery. If you have to use wax, make sure that it is non-skid floor wax.  Remove throw rugs and other tripping hazards from the floor. What can I do in the stairways?  Do not leave any items on the stairs.  Make sure that there are handrails on both sides of the stairs. Fix handrails that are broken or loose. Make sure that handrails are as long as the stairways.  Check any carpeting to make sure that it is firmly attached to the stairs. Fix any carpet that is loose or worn.  Avoid having throw rugs at the top or bottom of stairways, or secure the rugs with carpet tape to prevent them from moving.  Make sure that you have a light switch at the top of the stairs and the bottom of the stairs. If you do not have them, have them installed. What are some other fall prevention tips?  Wear closed-toe shoes that fit well and support your feet. Wear shoes that have rubber soles or low heels.  When you use a stepladder, make sure that it is completely opened and that the sides are firmly locked. Have someone hold the ladder while you are using it. Do not climb a closed stepladder.  Add color or contrast paint or tape to grab bars and handrails in your home. Place contrasting color strips on the first and last steps.  Use mobility aids as needed, such as canes, walkers, scooters, and crutches.  Turn on lights if it is dark. Replace any light bulbs that burn out.  Set up furniture so that there are clear paths. Keep the furniture in the same spot.  Fix any uneven floor surfaces.  Choose a  carpet design that does not hide the edge of steps of a stairway.  Be aware of any and all pets.  Review your medicines with your healthcare provider. Some medicines can cause dizziness or changes in blood pressure, which increase your risk of falling. Talk with your health care provider about other ways that you can decrease your risk of falls. This may include working with a physical therapist or trainer to improve your strength, balance, and endurance. This information is not intended to replace advice given to you by your health care provider. Make sure you discuss any questions you have with your health care provider. Document Released: 10/27/2002 Document Revised: 04/04/2016 Document Reviewed: 12/11/2014 Elsevier Interactive Patient Education  2017 [employer].

## 2017-03-14 NOTE — Progress Notes (Signed)
Pre visit review using our clinic review tool, if applicable. No additional management support is needed unless otherwise documented below in the visit note. 

## 2017-03-14 NOTE — Assessment & Plan Note (Signed)
S: patient denies neuropathic pain.  He has been on b12 for some time- wonder if this was perhaps for treatment of neuropathy  That being said- balance has been off for some time. Has been having falls for 3-4 years but seems to be worsening. Cannot stand on one leg. Gait and balance training for 3 months but didn't help. Falls are usually when he is out in the yard working and he does not have his hands free. No falls on hard surfaces and has not hit head.  O: intact to monofilament and proprioception.  A/P: I advised him to continue work that allows him to balance- such as holding lawnmower handle. Otherwise use cane- he declines at present. Also can discuss with RN Wynetta Fines.

## 2017-03-14 NOTE — Assessment & Plan Note (Signed)
S: uses prilosec sparingly for spicier meals.  A/P: hew was concerned about side effects- we discussed minimizing use to reduce side effects. Could also mention zantac option since does not use frequently.
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
## 2017-03-14 NOTE — Assessment & Plan Note (Signed)
S: controlled on Simvastatin 20mg . LDL 72 on 01/18/17 through [place]. Also on for primary prevention ASA 81 mg - A/P: continue current medications- doing well

## 2017-03-27 DIAGNOSIS — L821 Other seborrheic keratosis: Secondary | ICD-10-CM | POA: Diagnosis not present

## 2017-03-27 DIAGNOSIS — L814 Other melanin hyperpigmentation: Secondary | ICD-10-CM | POA: Diagnosis not present

## 2017-03-27 DIAGNOSIS — Z85828 Personal history of other malignant neoplasm of skin: Secondary | ICD-10-CM | POA: Diagnosis not present

## 2017-03-27 DIAGNOSIS — D1801 Hemangioma of skin and subcutaneous tissue: Secondary | ICD-10-CM | POA: Diagnosis not present

## 2017-04-30 ENCOUNTER — Encounter: Payer: Self-pay | Admitting: Family Medicine

## 2017-04-30 ENCOUNTER — Ambulatory Visit (INDEPENDENT_AMBULATORY_CARE_PROVIDER_SITE_OTHER): Payer: Medicare Other | Admitting: Family Medicine

## 2017-04-30 VITALS — BP 110/68 | HR 67 | Temp 97.9°F | Ht 68.5 in | Wt 168.8 lb

## 2017-04-30 DIAGNOSIS — Z23 Encounter for immunization: Secondary | ICD-10-CM | POA: Diagnosis not present

## 2017-04-30 DIAGNOSIS — W57XXXA Bitten or stung by nonvenomous insect and other nonvenomous arthropods, initial encounter: Secondary | ICD-10-CM | POA: Diagnosis not present

## 2017-04-30 DIAGNOSIS — S91002A Unspecified open wound, left ankle, initial encounter: Secondary | ICD-10-CM

## 2017-04-30 DIAGNOSIS — S90562A Insect bite (nonvenomous), left ankle, initial encounter: Secondary | ICD-10-CM | POA: Diagnosis not present

## 2017-04-30 MED ORDER — CEPHALEXIN 500 MG PO CAPS: 500.0000 mg | ORAL_CAPSULE | Freq: Two times a day (BID) | ORAL | 0 refills | Status: DC

## 2017-04-30 NOTE — Progress Notes (Signed)
Subjective:  Allen Robinson is a 83 y.o. year old very pleasant male patient who presents for/with See problem oriented charting ROS- no fever, chills, nausea, vomiting. Only mild pain over prior blistered now ruptured area. No expanding redness near larger site- some expanding redness near smaller site on medial leg.    Past Medical History-  Patient Active Problem List   Diagnosis Date Noted  . Gout 03/14/2017    Priority: Medium  . BPH associated with nocturia 03/14/2017    Priority: Medium  . Hyperlipidemia 03/14/2017    Priority: Medium  . GERD (gastroesophageal reflux disease) 03/14/2017    Priority: Medium  . Vitamin D deficiency 03/14/2017    Priority: Low  . Peripheral polyneuropathy 02/16/2016    Priority: Low  . Diplopia 11/19/2015    Priority: Low  . Insect bite of left ankle 04/30/2017    Medications- reviewed and updated Current Outpatient Prescriptions  Medication Sig Dispense Refill  . allopurinol (ZYLOPRIM) 100 MG tablet Take 100 mg by mouth daily.    [name] aspirin 81 MG tablet Take 81 mg by mouth daily.    . Cholecalciferol (VITAMIN D3) 10000 units TABS Take 1 tablet by mouth daily.    . colchicine 0.6 MG tablet Take 0.6 mg by mouth as needed.    . finasteride (PROSCAR) 5 MG tablet Take 5 mg by mouth daily.    . fluocinonide cream (LIDEX) 3.29 % Apply 1 application topically 2 (two) times daily.    [name] omeprazole (PRILOSEC) 20 MG capsule Take 20 mg by mouth daily.    . simvastatin (ZOCOR) 20 MG tablet Take 20 mg by mouth daily.    . vitamin B-12 (CYANOCOBALAMIN) 500 MCG tablet Take 500 mcg by mouth daily.     No current facility-administered medications for this visit.     Objective: BP 110/68 (BP Location: Left Arm, Patient Position: Sitting, Cuff Size: Normal)   Pulse 67   Temp 97.9 F (36.6 C) (Oral)   Ht 5' 8.5" (1.74 m)   Wt 168 lb 12.8 oz (76.6 kg)   SpO2 96%   BMI 25.29 kg/m  Gen: NAD, resting comfortably CV: RRR no murmurs rubs or  gallops Lungs: CTAB no crackles, wheeze, rhonchi Abdomen: soft/nontender/nondistended/normal bowel sounds. No rebound or guarding.  Ext: no edema, on lateral lower leg about 6 x 6 cm erythematous area (site of prior ruptured blister/bulla) and at the backside of wound is a darker < 1 cm almost purplish ruptured vesicle. In second picture there is a under 1 cm raised bliste that weeps clear fluid - mild surrounding erythema and mild warmth        Assessment/Plan:  Insect bite of left ankle, initial encounter Ankle wound, left, initial encounter - Plan: Td : Tetanus/diphtheria >7yo Preservative  free S: patient was working in his yard with high boots on Friday (3 days ago) - states he had several "bites" - had 2 spots on lateral and medial ankle. Felt pain in the area- did not find responsible insect Noted dark small under 1 cm blister and soon on lateral side developed into much larger protruding bulla which eventually ruptured leaving above area. Overall area smaller than it was over the weekend fortunately. No expansion. Only mild aching pain but primarily only with palpation.   He had left his boot in the basement so wonders if there was a spider in them. Has a history of multiple chigger bites but never as prominent as this.   Td between  5 and 10 years ago.  A/P: Given pain - suspect these are likely insect bites/stings. If black widow or brown recluse would suspect progressive. Larger lesion honestly more looks like friction related issue but given small blistered area it started from do not strongly suspect friction as cause (also used same boots in same position working in yard in past with no similar issues)  Patient is worried about infection (early cellulitis) with expanding redness near smaller lesion- we opted to cover with keflex which hopefully will prevent infection from larger wound. He prefers shorter course so we opted for 5 days. Will keep covered with tefla nonadherent  dressing. We scheduled follow up on Thursday- he is to see [place] sooner for newer or worsening symptoms.   Orders Placed This Encounter  Procedures  . Td : Tetanus/diphtheria >7yo Preservative  free   Meds ordered this encounter  Medications  . fluocinonide cream (LIDEX) 0.05 %    Sig: Apply 1 application topically 2 (two) times daily.  . cephALEXin (KEFLEX) 500 MG capsule    Sig: Take 1 capsule (500 mg total) by mouth 2 (two) times daily.    Dispense:  10 capsule    Refill:  0    Return precautions advised.  Garret Reddish, MD

## 2017-04-30 NOTE — Patient Instructions (Addendum)
Change dressing once a day (mainly to avoid having area hit something)  Antibiotic for 5 days  Unclear which type of insect in particular got you but does seem like insect bite.   Lets recheck this on Thursday- see [place] sooner for worsening redness, fever, other new concerning symptoms

## 2017-05-03 ENCOUNTER — Ambulatory Visit (INDEPENDENT_AMBULATORY_CARE_PROVIDER_SITE_OTHER): Payer: Medicare Other | Admitting: Family Medicine

## 2017-05-03 ENCOUNTER — Encounter: Payer: Self-pay | Admitting: Family Medicine

## 2017-05-03 VITALS — BP 131/70 | HR 70 | Temp 97.9°F | Ht 68.5 in | Wt 170.0 lb

## 2017-05-03 DIAGNOSIS — S91002A Unspecified open wound, left ankle, initial encounter: Secondary | ICD-10-CM

## 2017-05-03 NOTE — Patient Instructions (Signed)
Follow up 11 30 AM on Monday  Continue to wash once a day with soapy water then redress with nonstick bandage  Finish antibiotic. Can use neosporin as well  If you have fever, I need to know immediately or worsening pain.

## 2017-05-03 NOTE — Progress Notes (Signed)
Subjective:  Allen Robinson is a 83 y.o. year old very pleasant male patient who presents for/with See problem oriented charting ROS- no fever, chills, nausea, vomiting. Some expanding redness down ankle this Am but looks better at present.    Past Medical History-  Patient Active Problem List   Diagnosis Date Noted  . Gout 03/14/2017    Priority: Medium  . BPH associated with nocturia 03/14/2017    Priority: Medium  . Hyperlipidemia 03/14/2017    Priority: Medium  . GERD (gastroesophageal reflux disease) 03/14/2017    Priority: Medium  . Vitamin D deficiency 03/14/2017    Priority: Low  . Peripheral polyneuropathy 02/16/2016    Priority: Low  . Diplopia 11/19/2015    Priority: Low  . Insect bite of left ankle 04/30/2017    Medications- reviewed and updated Current Outpatient Prescriptions  Medication Sig Dispense Refill  . allopurinol (ZYLOPRIM) 100 MG tablet Take 100 mg by mouth daily.    [name] aspirin 81 MG tablet Take 81 mg by mouth daily.    . cephALEXin (KEFLEX) 500 MG capsule Take 1 capsule (500 mg total) by mouth 2 (two) times daily. 10 capsule 0  . Cholecalciferol (VITAMIN D3) 10000 units TABS Take 1 tablet by mouth daily.    . colchicine 0.6 MG tablet Take 0.6 mg by mouth as needed.    . finasteride (PROSCAR) 5 MG tablet Take 5 mg by mouth daily.    . fluocinonide cream (LIDEX) 2.42 % Apply 1 application topically 2 (two) times daily.    [name] omeprazole (PRILOSEC) 20 MG capsule Take 20 mg by mouth daily.    . simvastatin (ZOCOR) 20 MG tablet Take 20 mg by mouth daily.    . vitamin B-12 (CYANOCOBALAMIN) 500 MCG tablet Take 500 mcg by mouth daily.     No current facility-administered medications for this visit.     Objective: BP 131/70 (BP Location: Left Arm, Patient Position: Sitting, Cuff Size: Large)   Pulse 70   Temp 97.9 F (36.6 C) (Oral)   Ht 5' 8.5" (1.74 m)   Wt 170 lb (77.1 kg)   SpO2 96%   BMI 25.47 kg/m  Gen: NAD, resting comfortably CV: RRR no  murmurs rubs or gallops Lungs: CTAB no crackles, wheeze, rhonchi Abdomen: soft/nontender/nondistended/normal bowel sounds. No rebound or guarding.  Ext: trace edema pretibially and slight edema below owund Skin: appears to have well healing wound on left lower leg at lateral ankle- size is similar to prior- very mild edema and redness below site but none expanding upwards. Patient does have pain with palpation of ankle joint which is new for him and was wheeled in with wheelchair due to pain today while walked in the other day. On medial side- that lesions surrounding redness is now gone and area appears slightly smaller.        Assessment/Plan:  Ankle wound, left, initial encounter - Plan: AMB referral to wound care center S: I saw patient 3 days ago after he had potential insect bite 3 days prior to that. Blister formation that then burst on lateral ankle. Minimal pain at that time but over last 24 hours or so he has developed up to 6/10 pain in ankle both on medial and lateral side with no redness in those areas. He wanted to be wheeled in today instead of walking. He is concerned about brown recluse bite and potential consequences A/P: Given no worsening of wound itself- we opted to continue to monitor and follow  up on Monday. We were able to get him an appointment with wound care but will be I believe the 27th of the month. I am hopeful area will continue to heal by then. Prior expansion of redness on other side of foot has resolved so will finish 5 days keflex only.   We discussed possible infection of ankle joint with severe pain and tenderness on both medial and lateral side of joint. Discussed ortho referral to consider joint aspiration and he delcines. Also declines seeing them for potential advanced imaging. Did agree to close follow up and seeking care over weekend if new or worsening symptoms.   Patient Instructions  Follow up 11 30 AM on Monday  Continue to wash once a day with  soapy water then redress with nonstick bandage  Finish antibiotic. Can use neosporin as well  If you have fever, I need to know immediately or worsening pain.   Orders Placed This Encounter  Procedures  . AMB referral to wound care center    Referral Priority:   Urgent    Referral Type:   Consultation    Number of Visits Requested:   1   Return precautions advised.  Garret Reddish, MD

## 2017-05-04 ENCOUNTER — Telehealth: Payer: Self-pay | Admitting: Family Medicine

## 2017-05-04 NOTE — Telephone Encounter (Signed)
° ° ° °  Pt call today to say his foot is worse and would like a call back about his medication    [phone]

## 2017-05-04 NOTE — Telephone Encounter (Signed)
Patient states he opted to take colchicine x2 then a repeat an hour later and helped the ankle. Possible gout flare. He was holding allopurinol but I told him to still take this. Follow up Monday. 1 colchicine per day on Saturday, Sunday, monday

## 2017-05-07 ENCOUNTER — Encounter: Payer: Self-pay | Admitting: Family Medicine

## 2017-05-07 ENCOUNTER — Ambulatory Visit (INDEPENDENT_AMBULATORY_CARE_PROVIDER_SITE_OTHER): Payer: Medicare Other | Admitting: Family Medicine

## 2017-05-07 VITALS — BP 98/60 | HR 61 | Temp 97.8°F | Ht 68.5 in | Wt 167.4 lb

## 2017-05-07 DIAGNOSIS — M1A00X0 Idiopathic chronic gout, unspecified site, without tophus (tophi): Secondary | ICD-10-CM | POA: Diagnosis not present

## 2017-05-07 DIAGNOSIS — S91002A Unspecified open wound, left ankle, initial encounter: Secondary | ICD-10-CM

## 2017-05-07 NOTE — Patient Instructions (Addendum)
Keep the appointment on the 27th with wound care center. Reach out to [place] if new or worsening symptoms before then- call or mychart.   Glad the area is improving

## 2017-05-07 NOTE — Progress Notes (Signed)
Subjective:  Allen Robinson is a 83 y.o. year old very pleasant male patient who presents for/with See problem oriented charting ROS- no fever, chills, nausea. No expanding redness near site. Still some pain below site but no longer having pain on medial side of ankle.    Past Medical History-  Patient Active Problem List   Diagnosis Date Noted  . Gout 03/14/2017    Priority: Medium  . BPH associated with nocturia 03/14/2017    Priority: Medium  . Hyperlipidemia 03/14/2017    Priority: Medium  . GERD (gastroesophageal reflux disease) 03/14/2017    Priority: Medium  . Vitamin D deficiency 03/14/2017    Priority: Low  . Peripheral polyneuropathy 02/16/2016    Priority: Low  . Diplopia 11/19/2015    Priority: Low  . Insect bite of left ankle 04/30/2017    Medications- reviewed and updated Current Outpatient Prescriptions  Medication Sig Dispense Refill  . allopurinol (ZYLOPRIM) 100 MG tablet Take 100 mg by mouth daily.    [name] aspirin 81 MG tablet Take 81 mg by mouth daily.    . Cholecalciferol (VITAMIN D3) 10000 units TABS Take 1 tablet by mouth daily.    . colchicine 0.6 MG tablet Take 0.6 mg by mouth as needed.    . finasteride (PROSCAR) 5 MG tablet Take 5 mg by mouth daily.    . fluocinonide cream (LIDEX) 5.62 % Apply 1 application topically 2 (two) times daily.    [name] omeprazole (PRILOSEC) 20 MG capsule Take 20 mg by mouth daily.    . simvastatin (ZOCOR) 20 MG tablet Take 20 mg by mouth daily.    . vitamin B-12 (CYANOCOBALAMIN) 500 MCG tablet Take 500 mcg by mouth daily.     No current facility-administered medications for this visit.     Objective: BP 98/60 (BP Location: Left Arm, Patient Position: Sitting, Cuff Size: Large)   Pulse 61   Temp 97.8 F (36.6 C) (Oral)   Ht 5' 8.5" (1.74 m)   Wt 167 lb 6.4 oz (75.9 kg)   SpO2 92%   BMI 25.08 kg/m  Gen: NAD, resting comfortably CV: RRR no murmurs rubs or gallops Lungs: CTAB no crackles, wheeze, rhonchi Ext: no  edema pretibially, some edema below site with mild erythema Wound as below- size of open wound improving, in posterior portion there is some increasing area of darker material Skin: warm, dry Neuro: grossly normal, moves all extremities   today   Last visit  Assessment/Plan:  Ankle wound, left, initial encounter  Chronic idiopathic gout S: Patient has finished antibiotics. No expanding redness. Pain in medial side of ankle improved with colchicine and likely signified gout flare- this has improved though still has some lateral ankle pain below the wound. Feels like he has pain with moving the ankle- avoiding stairs but walking on flat surfaces ok. In wheelchair today but states could stand A/P: gout attack improved/resolved. Wound appears to be improving size wise- I am concerned about possible necrosis in posterior portion and encouraged to keep 27th appointment with wound care and we could see him back before then if needed for new or worsening symptoms.   Patient Instructions  Keep the appointment on the 27th with wound care center. Reach out to [place] if new or worsening symptoms before then- call or mychart.   Glad the area is improving   Return precautions advised.  Garret Reddish, MD

## 2017-05-16 ENCOUNTER — Encounter (HOSPITAL_BASED_OUTPATIENT_CLINIC_OR_DEPARTMENT_OTHER): Payer: Medicare Other

## 2017-06-01 DIAGNOSIS — H2513 Age-related nuclear cataract, bilateral: Secondary | ICD-10-CM | POA: Diagnosis not present

## 2017-07-06 DIAGNOSIS — H524 Presbyopia: Secondary | ICD-10-CM | POA: Diagnosis not present

## 2017-07-06 DIAGNOSIS — H2513 Age-related nuclear cataract, bilateral: Secondary | ICD-10-CM | POA: Diagnosis not present

## 2017-07-31 DIAGNOSIS — H25811 Combined forms of age-related cataract, right eye: Secondary | ICD-10-CM | POA: Diagnosis not present

## 2017-07-31 DIAGNOSIS — H2511 Age-related nuclear cataract, right eye: Secondary | ICD-10-CM | POA: Diagnosis not present

## 2017-08-21 DIAGNOSIS — H2512 Age-related nuclear cataract, left eye: Secondary | ICD-10-CM | POA: Diagnosis not present

## 2017-08-21 DIAGNOSIS — H25812 Combined forms of age-related cataract, left eye: Secondary | ICD-10-CM | POA: Diagnosis not present

## 2017-09-04 DIAGNOSIS — Z23 Encounter for immunization: Secondary | ICD-10-CM | POA: Diagnosis not present

## 2017-09-28 DIAGNOSIS — H1013 Acute atopic conjunctivitis, bilateral: Secondary | ICD-10-CM | POA: Diagnosis not present

## 2018-01-22 LAB — BASIC METABOLIC PANEL
BUN: 23 — AB (ref 4–21)
Creatinine: 1 (ref 0.6–1.3)
GLUCOSE: 104
POTASSIUM: 4.5 (ref 3.4–5.3)
SODIUM: 142 (ref 137–147)

## 2018-01-22 LAB — CBC AND DIFFERENTIAL
HEMATOCRIT: 44 (ref 41–53)
HEMOGLOBIN: 14.5 (ref 13.5–17.5)
Platelets: 170 (ref 150–399)
WBC: 6.4

## 2018-01-22 LAB — LIPID PANEL
Cholesterol: 140 (ref 0–200)
HDL: 43 (ref 35–70)
LDL CALC: 83
TRIGLYCERIDES: 69 (ref 40–160)

## 2018-03-18 ENCOUNTER — Encounter: Payer: Self-pay | Admitting: Family Medicine

## 2018-03-18 ENCOUNTER — Ambulatory Visit (INDEPENDENT_AMBULATORY_CARE_PROVIDER_SITE_OTHER): Payer: Medicare Other | Admitting: Family Medicine

## 2018-03-18 VITALS — BP 124/78 | HR 56 | Temp 97.5°F | Ht 68.5 in | Wt 171.0 lb

## 2018-03-18 DIAGNOSIS — N401 Enlarged prostate with lower urinary tract symptoms: Secondary | ICD-10-CM | POA: Diagnosis not present

## 2018-03-18 DIAGNOSIS — R351 Nocturia: Secondary | ICD-10-CM | POA: Diagnosis not present

## 2018-03-18 DIAGNOSIS — M1A0790 Idiopathic chronic gout, unspecified ankle and foot, without tophus (tophi): Secondary | ICD-10-CM

## 2018-03-18 DIAGNOSIS — Z79899 Other long term (current) drug therapy: Secondary | ICD-10-CM

## 2018-03-18 DIAGNOSIS — E785 Hyperlipidemia, unspecified: Secondary | ICD-10-CM

## 2018-03-18 DIAGNOSIS — K219 Gastro-esophageal reflux disease without esophagitis: Secondary | ICD-10-CM | POA: Diagnosis not present

## 2018-03-18 LAB — HEPATIC FUNCTION PANEL
ALK PHOS: 90 U/L (ref 39–117)
ALT: 16 U/L (ref 0–53)
AST: 22 U/L (ref 0–37)
Albumin: 4.3 g/dL (ref 3.5–5.2)
BILIRUBIN DIRECT: 0.1 mg/dL (ref 0.0–0.3)
BILIRUBIN TOTAL: 0.7 mg/dL (ref 0.2–1.2)
Total Protein: 6.5 g/dL (ref 6.0–8.3)

## 2018-03-18 LAB — URIC ACID: URIC ACID, SERUM: 5.2 mg/dL (ref 4.0–7.8)

## 2018-03-18 LAB — VITAMIN B12: VITAMIN B 12: 483 pg/mL (ref 211–911)

## 2018-03-18 NOTE — Assessment & Plan Note (Addendum)
S: Continues on omeprazole 20 mg sparingly with reasonable control of acid reflux. A/P: Discussed risk of low B12-will check this with labs

## 2018-03-18 NOTE — Progress Notes (Signed)
Subjective:  Allen Robinson is a 84 y.o. year old very pleasant male patient who presents for/with See problem oriented charting ROS- No chest pain or shortness of breath. No headache or blurry vision.  Some balance issues   Past Medical History-  Patient Active Problem List   Diagnosis Date Noted  . Gout 03/14/2017    Priority: Medium  . BPH associated with nocturia 03/14/2017    Priority: Medium  . Hyperlipidemia 03/14/2017    Priority: Medium  . GERD (gastroesophageal reflux disease) 03/14/2017    Priority: Medium  . Vitamin D deficiency 03/14/2017    Priority: Low  . Peripheral polyneuropathy 02/16/2016    Priority: Low  . Diplopia 11/19/2015    Priority: Low  . Insect bite of left ankle 04/30/2017    Medications- reviewed and updated Current Outpatient Medications  Medication Sig Dispense Refill  . simvastatin (ZOCOR) 40 MG tablet Take 40 mg by mouth at bedtime.    [name] allopurinol (ZYLOPRIM) 100 MG tablet Take 100 mg by mouth 2 (two) times daily.     . Cholecalciferol (VITAMIN D3) 10000 units TABS Take 1 tablet by mouth daily.    . colchicine 0.6 MG tablet Take 0.6 mg by mouth as needed.    . finasteride (PROSCAR) 5 MG tablet Take 5 mg by mouth daily.    [name] omeprazole (PRILOSEC) 20 MG capsule Take 20 mg by mouth daily.    . vitamin B-12 (CYANOCOBALAMIN) 500 MCG tablet Take 500 mcg by mouth daily.     No current facility-administered medications for this visit.     Objective: BP 124/78 (BP Location: Left Arm, Patient Position: Sitting, Cuff Size: Normal)   Pulse (!) 56   Temp (!) 97.5 F (36.4 C) (Oral)   Ht 5' 8.5" (1.74 m)   Wt 171 lb (77.6 kg)   SpO2 95%   BMI 25.62 kg/m  Gen: NAD, resting comfortably CV: RRR no murmurs rubs or gallops Lungs: CTAB no crackles, wheeze, rhonchi Abdomen: soft/nontender/nondistended/normal bowel sounds. Healthy weight Ext: no edema Skin: warm, dry Neuro: Normal speech.  Assessment/Plan:  Other notes: 1. Gets all of his  medications from the [place] 2. Balance issues particularly with weedeating- I advised him to get someone to help him with the weed eating portion since this increases his fall risk. Advised awv for reinforcement. Doing an activity for senior center for balance this afternoon. Offered PT- he declines for now- advised 6 month check in  3. Has flat tire- I offered to change this to his spare for him but he declines. States he is self reliant. I asked our front office to watch out in case he has any difficulty. Apparently he has [employer] service to come help him.   Hyperlipidemia S: Reasonably controlled on simvastatin 40 mg (but only takes half tablet)  Lab Results  Component Value Date   CHOL 132 01/18/2017   HDL 47 01/18/2017   LDLCALC 72 01/18/2017   TRIG 65 01/18/2017   A/P: Continue current medications.  Update lipids today.  We discussed he could stop his aspirin  BPH associated with nocturia S: BPH symptoms reasonably controlled on finasteride 5 mg.  Nocturia about once a night- he came off and this worsened to 3x a night so restarted. Continue current medication would not recommend PSA testing at his age. A/P: Continue current medication.  Would not recommend PSA testing at his age.  GERD (gastroesophageal reflux disease) S: Continues on omeprazole 20 mg sparingly with reasonable control  of acid reflux. A/P: Discussed risk of low B12-will check this with labs  Gout S: 0 flares in last 6 months  on allopurinol 200 mg.  Has colchicine on hand. A/P: Update uric acid  No future appointments. Return in about 1 year (around 03/19/2019) for follow up- or sooner if needed. we also discussed 6 month check in verbally. .  Lab/Order associations: Hyperlipidemia, unspecified hyperlipidemia type - Plan: Vitamin B12, Hepatic function panel  Chronic idiopathic gout involving toe without tophus, unspecified laterality - Plan: Uric acid  High risk medication use - Plan: Vitamin B12  Return  precautions advised.  Garret Reddish, MD

## 2018-03-18 NOTE — Patient Instructions (Addendum)
I would also like for you to sign up for an annual wellness visit with one of our nurses, Cassie or Manuela Schwartz, who both specialize in the annual wellness visit. This is a free benefit under medicare that may help [place] find additional ways to help you. Some highlights are reviewing medications, lifestyle, and doing a dementia screen.   Stop aspirin 81mg . No other changes to medication.   Please stop by lab before you go

## 2018-03-18 NOTE — Assessment & Plan Note (Addendum)
S: Reasonably controlled on simvastatin 40 mg (but only takes half tablet)  Lab Results  Component Value Date   CHOL 132 01/18/2017   HDL 47 01/18/2017   LDLCALC 72 01/18/2017   TRIG 65 01/18/2017   A/P: Continue current medications.  Update lipids today.  We discussed he could stop his aspirin

## 2018-03-18 NOTE — Progress Notes (Signed)
Your uric acid is at goal of 6 or less to reduce risk of gout flares Your liver function tests are normal Your vitamin B12 is adequate

## 2018-03-18 NOTE — Assessment & Plan Note (Signed)
S: 0 flares in last 6 months  on allopurinol 200 mg.  Has colchicine on hand. A/P: Update uric acid

## 2018-03-18 NOTE — Assessment & Plan Note (Addendum)
S: BPH symptoms reasonably controlled on finasteride 5 mg.  Nocturia about once a night- he came off and this worsened to 3x a night so restarted. Continue current medication would not recommend PSA testing at his age. A/P: Continue current medication.  Would not recommend PSA testing at his age.

## 2018-03-19 ENCOUNTER — Encounter: Payer: Self-pay | Admitting: Family Medicine

## 2018-03-19 LAB — URINALYSIS, DIPSTICK ONLY
BILIRUBIN: NEGATIVE
Blood: NEGATIVE
Glucose: NEGATIVE
KETONE: NEGATIVE
LEUKOCYTES UA: NEGATIVE — AB
NITRITE: NEGATIVE
Protein: NEGATIVE
Specific Gravity: 1.016
Urobilinogen, Ur: <2
pH: 5

## 2018-03-22 ENCOUNTER — Telehealth: Payer: Self-pay

## 2018-03-22 NOTE — Telephone Encounter (Signed)
Called and spoke to patient's wife and gave her his lab results. She verbalized understanding.

## 2018-04-17 ENCOUNTER — Encounter: Payer: Self-pay | Admitting: *Deleted

## 2018-04-17 NOTE — Progress Notes (Signed)
Subjective:   Allen Robinson is a 84 y.o. male who presents for Medicare Annual/Subsequent preventive examination.  Reports health as Typical day garden with 2 acres Takes care of the home;  Married 22 years  Wife hx of bypass surgery  Seen wife last year    Diet Breakfast cereal with fruit - fresh strawberreies Lunch - sandwich Dinner full meal  Chol 140; trig 69 A1c 6.2 01/2016  BMI 24    Exercise [facility] Does balance exercise every day     There are no preventive care reminders to display for this patient.   Cardiac Risk Factors include: advanced age (>40men, >55 women);dyslipidemia;male gender     Objective:    Vitals: BP 120/70   Pulse 62   Ht 5\' 10"  (1.778 m)   Wt 171 lb (77.6 kg)   SpO2 96%   BMI 24.54 kg/m   Body mass index is 24.54 kg/m.  Advanced Directives 04/18/2018 03/14/2017  Does Patient Have a Medical Advance Directive? Yes Yes    Tobacco Social History   Tobacco Use  Smoking Status Never Smoker  Smokeless Tobacco Never Used     Counseling given: Yes   Clinical Intake:   Past Medical History:  Diagnosis Date  . Arthritis    hands  . Chicken pox   . Hyperlipidemia   . Pneumonia   . Seasonal allergies    Past Surgical History:  Procedure Laterality Date  . TONSILLECTOMY     1936   Family History  Problem Relation Age of Onset  . Heart failure Mother   . Pneumonia Mother        died 67  . Stroke Father        76  . Neuropathy Neg Hx    Social History   Socioeconomic History  . Marital status: Married    Spouse name: [name]  . Number of children: 2  . Years of education: 21  . Highest education level: Not on file  Occupational History  . Not on file  Social Needs  . Financial resource strain: Not on file  . Food insecurity:    Worry: Not on file    Inability: Not on file  . Transportation needs:    Medical: Not on file    Non-medical: Not on file  Tobacco Use    . Smoking status: Never Smoker  . Smokeless tobacco: Never Used  Substance and Sexual Activity  . Alcohol use: Yes    Alcohol/week: 0.0 oz    Comment: no longer drinking. prior social  . Drug use: No  . Sexual activity: Not on file  Lifestyle  . Physical activity:    Days per week: Not on file    Minutes per session: Not on file  . Stress: Not on file  Relationships  . Social connections:    Talks on phone: Not on file    Gets together: Not on file    Attends religious service: Not on file    Active member of club or organization: Not on file    Attends meetings of clubs or organizations: Not on file    Relationship status: Not on file  Other Topics Concern  . Not on file  Social History Narrative   Married. Lives with wife. 2 children- daughter Freight forwarder (2 daughters one will be a Restaurant manager, fast food), daughter Lodema Hong and never came back home- still at [employer] (1 grandson) so 3 grandkids  total.        Fought in [place] war- wife was also in the war and veteran.       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     Outpatient Encounter Medications as of 04/18/2018  Medication Sig  . allopurinol (ZYLOPRIM) 100 MG tablet Take 100 mg by mouth 2 (two) times daily.   . Cholecalciferol (VITAMIN D3) 10000 units TABS Take 1 tablet by mouth daily.  . colchicine 0.6 MG tablet Take 0.6 mg by mouth as needed.  . finasteride (PROSCAR) 5 MG tablet Take 5 mg by mouth daily.  [name] omeprazole (PRILOSEC) 20 MG capsule Take 20 mg by mouth daily.  . simvastatin (ZOCOR) 40 MG tablet Take 40 mg by mouth at bedtime.  . vitamin B-12 (CYANOCOBALAMIN) 500 MCG tablet Take 500 mcg by mouth daily.   No facility-administered encounter medications on file as of 04/18/2018.     Activities of Daily Living In your present state of health, do you have any difficulty performing the following activities: 04/18/2018  Hearing? Y  Vision? N  Difficulty concentrating or making decisions? N  Walking or  climbing stairs? Y  Comment has to be very careful  Dressing or bathing? N  Doing errands, shopping? N  Preparing Food and eating ? N  Using the Toilet? N  In the past six months, have you accidently leaked urine? (No Data)  Comment on medicine  Do you have problems with loss of bowel control? N  Managing your Medications? N  Managing your Finances? N  Housekeeping or managing your Housekeeping? N  Some recent data might be hidden    Patient Care Team: Marin Olp, MD as PCP - General (Family Medicine)   Assessment:   This is a routine wellness examination for Allen Robinson.  Exercise Activities and Dietary recommendations Current Exercise Habits: Home exercise routine;Structured exercise class, Type of exercise: Other - see comments(balance classes ), Intensity: Moderate  Goals    . Patient Stated     To be as health next year Keep up the balance exercises        Fall Risk Fall Risk  04/18/2018 03/18/2018 03/14/2017 03/14/2017  Falls in the past year? Yes Yes Yes Yes  Comment no more falls - in the yard getting limbs up in the yard  -  Number falls in past yr: - 2 or more 2 or more 1  Injury with Fall? - No - No  Risk Factor Category  - High Fall Risk - -  Follow up - - Education provided -     Depression Screen PHQ 2/9 Scores 04/18/2018 03/18/2018 03/14/2017 03/14/2017  PHQ - 2 Score 0 0 0 0    Cognitive Function MMSE - Mini Mental State Exam 04/18/2018 03/14/2017  Not completed: (No Data) (No Data)     Ad8 score reviewed for issues:  Issues making decisions:  Less interest in hobbies / activities:  Repeats questions, stories (family complaining):  Trouble using ordinary gadgets (microwave, computer, phone):  Forgets the month or year:   Mismanaging finances:   Remembering appts:  Daily problems with thinking and/or memory: Ad8 score is=0 Allen Robinson is very pleasant and engaging 84 yo. Recalled my home town from our conversation last year. He discussed  intricate management of his health benefits between the [employer] and [employer] benefit. No memory issues at this time interfering with his independence.        Immunization History  Administered  Date(s) Administered  . Influenza,inj,Quad PF,6+ Mos 09/08/2016  . Pneumococcal Conjugate-13 01/23/2014  . Pneumococcal-Unspecified 08/20/2000  . Td 04/30/2017  . Tdap 11/21/2010      Screening Tests Health Maintenance  Topic Date Due  . INFLUENZA VACCINE  06/20/2018  . TETANUS/TDAP  05/01/2027  . PNA vac Low Risk Adult  Completed         Plan:      PCP Notes   Health Maintenance Was just in to see Dr. Yong Channel in April Educated regarding the shingrix- may [facility] at the [facility]   Abnormal Screens  none  Referrals  none  Patient concerns; He continues to attend a balance class at the senior center and works on his balance every day. Denies any falls in the last month   Nurse Concerns; As noted  Next PCP apt 09/20/2018      I have personally reviewed and noted the following in the patient's chart:   . Medical and social history . Use of alcohol, tobacco or illicit drugs  . Current medications and supplements . Functional ability and status . Nutritional status . Physical activity . Advanced directives . List of other physicians . Hospitalizations, surgeries, and ER visits in previous 12 months . Vitals . Screenings to include cognitive, depression, and falls . Referrals and appointments  In addition, I have reviewed and discussed with patient certain preventive protocols, quality metrics, and best practice recommendations. A written personalized care plan for preventive services as well as general preventive health recommendations were provided to patient.     Wynetta Fines, RN  04/18/2018

## 2018-04-18 ENCOUNTER — Ambulatory Visit (INDEPENDENT_AMBULATORY_CARE_PROVIDER_SITE_OTHER): Payer: Medicare Other | Admitting: *Deleted

## 2018-04-18 VITALS — BP 120/70 | HR 62 | Ht 70.0 in | Wt 171.0 lb

## 2018-04-18 DIAGNOSIS — Z0000 Encounter for general adult medical examination without abnormal findings: Secondary | ICD-10-CM | POA: Diagnosis not present

## 2018-04-18 NOTE — Patient Instructions (Addendum)
Allen Robinson , Thank you for taking time to come for your Medicare Wellness Visit. I appreciate your ongoing commitment to your health goals. Please review the following plan we discussed and let me know if I can assist you in the future.   Shingrix is a vaccine for the prevention of Shingles in Adults 50 and older.  If you are on Medicare, the shingrix is covered under your Part D plan, so you will take both of the vaccines in the series at your pharmacy. Please check with your benefits regarding applicable copays or out of pocket expenses.  The Shingrix is given in 2 vaccines approx 8 weeks apart. You must receive the 2nd dose prior to 6 months from receipt of the first. Please have the pharmacist print out you Immunization  dates for our office records      These are the goals we discussed: Goals    . Patient Stated     To be as health next year Keep up the balance exercises        This is a list of the screening recommended for you and due dates:  Health Maintenance  Topic Date Due  . Flu Shot  06/20/2018  . Tetanus Vaccine  05/01/2027  . Pneumonia vaccines  Completed      Fall Prevention in the Home Falls can cause injuries. They can happen to people of all ages. There are many things you can do to make your home safe and to help prevent falls. What can I do on the outside of my home?  Regularly fix the edges of walkways and driveways and fix any cracks.  Remove anything that might make you trip as you walk through a door, such as a raised step or threshold.  Trim any bushes or trees on the path to your home.  Use bright outdoor lighting.  Clear any walking paths of anything that might make someone trip, such as rocks or tools.  Regularly check to see if handrails are loose or broken. Make sure that both sides of any steps have handrails.  Any raised decks and porches should have guardrails on the edges.  Have any leaves, snow, or ice cleared regularly.  Use sand  or salt on walking paths during winter.  Clean up any spills in your garage right away. This includes oil or grease spills. What can I do in the bathroom?  Use night lights.  Install grab bars by the toilet and in the tub and shower. Do not use towel bars as grab bars.  Use non-skid mats or decals in the tub or shower.  If you need to sit down in the shower, use a plastic, non-slip stool.  Keep the floor dry. Clean up any water that spills on the floor as soon as it happens.  Remove soap buildup in the tub or shower regularly.  Attach bath mats securely with double-sided non-slip rug tape.  Do not have throw rugs and other things on the floor that can make you trip. What can I do in the bedroom?  Use night lights.  Make sure that you have a light by your bed that is easy to reach.  Do not use any sheets or blankets that are too big for your bed. They should not hang down onto the floor.  Have a firm chair that has side arms. You can use this for support while you get dressed.  Do not have throw rugs and other things on the  floor that can make you trip. What can I do in the kitchen?  Clean up any spills right away.  Avoid walking on wet floors.  Keep items that you use a lot in easy-to-reach places.  If you need to reach something above you, use a strong step stool that has a grab bar.  Keep electrical cords out of the way.  Do not use floor polish or wax that makes floors slippery. If you must use wax, use non-skid floor wax.  Do not have throw rugs and other things on the floor that can make you trip. What can I do with my stairs?  Do not leave any items on the stairs.  Make sure that there are handrails on both sides of the stairs and use them. Fix handrails that are broken or loose. Make sure that handrails are as long as the stairways.  Check any carpeting to make sure that it is firmly attached to the stairs. Fix any carpet that is loose or worn.  Avoid  having throw rugs at the top or bottom of the stairs. If you do have throw rugs, attach them to the floor with carpet tape.  Make sure that you have a light switch at the top of the stairs and the bottom of the stairs. If you do not have them, ask someone to add them for you. What else can I do to help prevent falls?  Wear shoes that: ? Do not have high heels. ? Have rubber bottoms. ? Are comfortable and fit you well. ? Are closed at the toe. Do not wear sandals.  If you use a stepladder: ? Make sure that it is fully opened. Do not climb a closed stepladder. ? Make sure that both sides of the stepladder are locked into place. ? Ask someone to hold it for you, if possible.  Clearly mark and make sure that you can see: ? Any grab bars or handrails. ? First and last steps. ? Where the edge of each step is.  Use tools that help you move around (mobility aids) if they are needed. These include: ? Canes. ? Walkers. ? Scooters. ? Crutches.  Turn on the lights when you go into a dark area. Replace any light bulbs as soon as they burn out.  Set up your furniture so you have a clear path. Avoid moving your furniture around.  If any of your floors are uneven, fix them.  If there are any pets around you, be aware of where they are.  Review your medicines with your doctor. Some medicines can make you feel dizzy. This can increase your chance of falling. Ask your doctor what other things that you can do to help prevent falls. This information is not intended to replace advice given to you by your health care provider. Make sure you discuss any questions you have with your health care provider. Document Released: 09/02/2009 Document Revised: 04/13/2016 Document Reviewed: 12/11/2014 Elsevier Interactive Patient Education  2018 [place] Maintenance, Male A healthy lifestyle and preventive care is important for your health and wellness. Ask your health care provider about what  schedule of regular examinations is right for you. What should I know about weight and diet? Eat a Healthy Diet  Eat plenty of vegetables, fruits, whole grains, low-fat dairy products, and lean protein.  Do not eat a lot of foods high in solid fats, added sugars, or salt.  Maintain a Healthy Weight Regular exercise can help  you achieve or maintain a healthy weight. You should:  Do at least 150 minutes of exercise each week. The exercise should increase your heart rate and make you sweat (moderate-intensity exercise).  Do strength-training exercises at least twice a week.  Watch Your Levels of Cholesterol and Blood Lipids  Have your blood tested for lipids and cholesterol every 5 years starting at 82 years of age. If you are at high risk for heart disease, you should start having your blood tested when you are 81 years old. You may need to have your cholesterol levels checked more often if: ? Your lipid or cholesterol levels are high. ? You are older than 82 years of age. ? You are at high risk for heart disease.  What should I know about cancer screening? Many types of cancers can be detected early and may often be prevented. Lung Cancer  You should be screened every year for lung cancer if: ? You are a current smoker who has smoked for at least 30 years. ? You are a former smoker who has quit within the past 15 years.  Talk to your health care provider about your screening options, when you should start screening, and how often you should be screened.  Colorectal Cancer  Routine colorectal cancer screening usually begins at 82 years of age and should be repeated every 5-10 years until you are 82 years old. You may need to be screened more often if early forms of precancerous polyps or small growths are found. Your health care provider may recommend screening at an earlier age if you have risk factors for colon cancer.  Your health care provider may recommend using home test kits  to check for hidden blood in the stool.  A small camera at the end of a tube can be used to examine your colon (sigmoidoscopy or colonoscopy). This checks for the earliest forms of colorectal cancer.  Prostate and Testicular Cancer  Depending on your age and overall health, your health care provider may do certain tests to screen for prostate and testicular cancer.  Talk to your health care provider about any symptoms or concerns you have about testicular or prostate cancer.  Skin Cancer  Check your skin from head to toe regularly.  Tell your health care provider about any new moles or changes in moles, especially if: ? There is a change in a mole's size, shape, or color. ? You have a mole that is larger than a pencil eraser.  Always use sunscreen. Apply sunscreen liberally and repeat throughout the day.  Protect yourself by wearing long sleeves, pants, a wide-brimmed hat, and sunglasses when outside.  What should I know about heart disease, diabetes, and high blood pressure?  If you are 104-51 years of age, have your blood pressure checked every 3-5 years. If you are 78 years of age or older, have your blood pressure checked every year. You should have your blood pressure measured twice-once when you are at a hospital or clinic, and once when you are not at a hospital or clinic. Record the average of the two measurements. To check your blood pressure when you are not at a hospital or clinic, you can use: ? An automated blood pressure machine at a pharmacy. ? A home blood pressure monitor.  Talk to your health care provider about your target blood pressure.  If you are between 33-88 years old, ask your health care provider if you should take aspirin to prevent heart disease.  Have regular diabetes screenings by checking your fasting blood sugar level. ? If you are at a normal weight and have a low risk for diabetes, have this test once every three years after the age of 50. ? If you  are overweight and have a high risk for diabetes, consider being tested at a younger age or more often.  A one-time screening for abdominal aortic aneurysm (AAA) by ultrasound is recommended for men aged 31-75 years who are current or former smokers. What should I know about preventing infection? Hepatitis B If you have a higher risk for hepatitis B, you should be screened for this virus. Talk with your health care provider to find out if you are at risk for hepatitis B infection. Hepatitis C Blood testing is recommended for:  Everyone born from [birth year] through 1963.  Anyone with known risk factors for hepatitis C.  Sexually Transmitted Diseases (STDs)  You should be screened each year for STDs including gonorrhea and chlamydia if: ? You are sexually active and are younger than 82 years of age. ? You are older than 82 years of age and your health care provider tells you that you are at risk for this type of infection. ? Your sexual activity has changed since you were last screened and you are at an increased risk for chlamydia or gonorrhea. Ask your health care provider if you are at risk.  Talk with your health care provider about whether you are at high risk of being infected with HIV. Your health care provider may recommend a prescription medicine to help prevent HIV infection.  What else can I do?  Schedule regular health, dental, and eye exams.  Stay current with your vaccines (immunizations).  Do not use any tobacco products, such as cigarettes, chewing tobacco, and e-cigarettes. If you need help quitting, ask your health care provider.  Limit alcohol intake to no more than 2 drinks per day. One drink equals 12 ounces of beer, 5 ounces of wine, or 1 ounces of hard liquor.  Do not use street drugs.  Do not share needles.  Ask your health care provider for help if you need support or information about quitting drugs.  Tell your health care provider if you often feel  depressed.  Tell your health care provider if you have ever been abused or do not feel safe at home. This information is not intended to replace advice given to you by your health care provider. Make sure you discuss any questions you have with your health care provider. Document Released: 05/04/2008 Document Revised: 07/05/2016 Document Reviewed: 08/10/2015 Elsevier Interactive Patient Education  [employer].

## 2018-04-18 NOTE — Progress Notes (Signed)
I have reviewed and agree with note, evaluation, plan.   Stephen Hunter, MD  

## 2018-05-12 DIAGNOSIS — T07XXXA Unspecified multiple injuries, initial encounter: Secondary | ICD-10-CM | POA: Diagnosis not present

## 2018-05-12 DIAGNOSIS — L03119 Cellulitis of unspecified part of limb: Secondary | ICD-10-CM | POA: Diagnosis not present

## 2018-05-21 ENCOUNTER — Other Ambulatory Visit (HOSPITAL_COMMUNITY): Payer: Self-pay | Admitting: Specialist

## 2018-05-21 DIAGNOSIS — G20 Parkinson's disease: Secondary | ICD-10-CM

## 2018-06-20 ENCOUNTER — Encounter (HOSPITAL_COMMUNITY): Admission: RE | Admit: 2018-06-20 | Payer: Non-veteran care | Source: Ambulatory Visit

## 2018-06-20 ENCOUNTER — Encounter (HOSPITAL_COMMUNITY): Payer: Non-veteran care

## 2018-06-21 ENCOUNTER — Encounter (HOSPITAL_COMMUNITY): Payer: Self-pay

## 2018-06-21 ENCOUNTER — Encounter (HOSPITAL_COMMUNITY)
Admission: RE | Admit: 2018-06-21 | Discharge: 2018-06-21 | Disposition: A | Discharge status: Home / Self Care | Payer: Medicare Other | Source: Ambulatory Visit | Attending: Specialist | Admitting: Specialist

## 2018-06-21 ENCOUNTER — Other Ambulatory Visit (HOSPITAL_COMMUNITY): Payer: Self-pay | Admitting: Specialist

## 2018-06-21 ENCOUNTER — Encounter (HOSPITAL_COMMUNITY): Admission: RE | Admit: 2018-06-21 | Payer: Medicare Other | Source: Ambulatory Visit

## 2018-06-21 ENCOUNTER — Ambulatory Visit (HOSPITAL_COMMUNITY)
Admission: RE | Admit: 2018-06-21 | Discharge: 2018-06-21 | Disposition: A | Discharge status: Home / Self Care | Payer: Medicare Other | Source: Ambulatory Visit | Attending: Specialist | Admitting: Specialist

## 2018-06-21 DIAGNOSIS — G20 Parkinson's disease: Secondary | ICD-10-CM

## 2018-06-21 DIAGNOSIS — R296 Repeated falls: Secondary | ICD-10-CM | POA: Diagnosis not present

## 2018-06-21 DIAGNOSIS — R42 Dizziness and giddiness: Secondary | ICD-10-CM | POA: Diagnosis not present

## 2018-06-21 MED ORDER — IOFLUPANE I 123 185 MBQ/2.5ML IV SOLN
4.5000 | Freq: Once | INTRAVENOUS | Status: AC
  Administered 2018-06-21: 4.5 via INTRAVENOUS

## 2018-06-21 MED ORDER — IODINE STRONG (LUGOLS) 5 % PO SOLN
0.8000 mL | Freq: Once | ORAL | Status: AC
Start: 2018-06-21 — End: 2018-06-21
  Administered 2018-06-21: 0.8 mL via ORAL

## 2018-07-16 DIAGNOSIS — D1801 Hemangioma of skin and subcutaneous tissue: Secondary | ICD-10-CM | POA: Diagnosis not present

## 2018-07-16 DIAGNOSIS — L812 Freckles: Secondary | ICD-10-CM | POA: Diagnosis not present

## 2018-07-16 DIAGNOSIS — D692 Other nonthrombocytopenic purpura: Secondary | ICD-10-CM | POA: Diagnosis not present

## 2018-07-16 DIAGNOSIS — Z85828 Personal history of other malignant neoplasm of skin: Secondary | ICD-10-CM | POA: Diagnosis not present

## 2018-07-16 DIAGNOSIS — L820 Inflamed seborrheic keratosis: Secondary | ICD-10-CM | POA: Diagnosis not present

## 2018-07-16 DIAGNOSIS — L821 Other seborrheic keratosis: Secondary | ICD-10-CM | POA: Diagnosis not present

## 2018-09-12 DIAGNOSIS — Z23 Encounter for immunization: Secondary | ICD-10-CM | POA: Diagnosis not present

## 2018-09-20 ENCOUNTER — Ambulatory Visit: Payer: Medicare Other | Admitting: Family Medicine

## 2018-10-07 ENCOUNTER — Encounter: Payer: Self-pay | Admitting: Family Medicine

## 2018-10-07 ENCOUNTER — Ambulatory Visit (INDEPENDENT_AMBULATORY_CARE_PROVIDER_SITE_OTHER): Payer: Medicare Other | Admitting: Family Medicine

## 2018-10-07 VITALS — BP 116/66 | HR 60 | Ht 70.0 in | Wt 170.6 lb

## 2018-10-07 DIAGNOSIS — E785 Hyperlipidemia, unspecified: Secondary | ICD-10-CM

## 2018-10-07 DIAGNOSIS — M1A0790 Idiopathic chronic gout, unspecified ankle and foot, without tophus (tophi): Secondary | ICD-10-CM

## 2018-10-07 DIAGNOSIS — N401 Enlarged prostate with lower urinary tract symptoms: Secondary | ICD-10-CM

## 2018-10-07 DIAGNOSIS — K219 Gastro-esophageal reflux disease without esophagitis: Secondary | ICD-10-CM | POA: Diagnosis not present

## 2018-10-07 DIAGNOSIS — R351 Nocturia: Secondary | ICD-10-CM | POA: Diagnosis not present

## 2018-10-07 DIAGNOSIS — R3589 Other polyuria: Secondary | ICD-10-CM

## 2018-10-07 DIAGNOSIS — R358 Other polyuria: Secondary | ICD-10-CM | POA: Diagnosis not present

## 2018-10-07 LAB — POC URINALSYSI DIPSTICK (AUTOMATED)
Bilirubin, UA: NEGATIVE
Blood, UA: NEGATIVE
GLUCOSE UA: NEGATIVE
Ketones, UA: NEGATIVE
LEUKOCYTES UA: NEGATIVE
NITRITE UA: NEGATIVE
Protein, UA: NEGATIVE
Spec Grav, UA: >=1.03 — AB (ref 1.010–1.025)
UROBILINOGEN UA: 0.2 U/dL
pH, UA: 5.5 (ref 5.0–8.0)

## 2018-10-07 NOTE — Patient Instructions (Addendum)
Lets do blood work next visit if [facility] doesn't do it first  Glad to see you today- no changes planned

## 2018-10-07 NOTE — Assessment & Plan Note (Signed)
S: 0 flares in 6 months on allopurinol 200 mg.  Has colchicine on hand as needed for flareups Lab Results  Component Value Date   LABURIC 5.2 03/18/2018  A/P: stable without flares- continue uric acid goal under 6.

## 2018-10-07 NOTE — Assessment & Plan Note (Signed)
S: Patient's BPH symptoms are managed by finasteride 5 mg.  He pees 3x a night which is slightly worse. Also deals with dribbling. Asks about increasing meds. Has had ultrasound at [facility] last marge- voids 80% of his urine.  A/P:we discussed flomax increases fall risk so cant really add that and finasteride is at max dose.  He asks about updating UA as sometimes he wonders about UTI.

## 2018-10-07 NOTE — Assessment & Plan Note (Signed)
S: well controlled on simvastatin 20 mg Lab Results  Component Value Date   CHOL 140 01/22/2018   HDL 43 01/22/2018   LDLCALC 83 01/22/2018   TRIG 69 01/22/2018   A/P: Stable-continue current medications-at his age I think LDL goal under 100 is reasonable

## 2018-10-07 NOTE — Progress Notes (Signed)
Subjective:  Allen Robinson is a 84 y.o. year old very pleasant male patient who presents for/with See problem oriented charting ROS- No chest pain or shortness of breath. No headache or blurry vision.    Past Medical History-  Patient Active Problem List   Diagnosis Date Noted  . Gout 03/14/2017    Priority: Medium  . BPH associated with nocturia 03/14/2017    Priority: Medium  . Hyperlipidemia 03/14/2017    Priority: Medium  . GERD (gastroesophageal reflux disease) 03/14/2017    Priority: Medium  . Vitamin D deficiency 03/14/2017    Priority: Low  . Peripheral polyneuropathy 02/16/2016    Priority: Low  . Diplopia 11/19/2015    Priority: Low  . Insect bite of left ankle 04/30/2017    Medications- reviewed and updated Current Outpatient Medications  Medication Sig Dispense Refill  . allopurinol (ZYLOPRIM) 100 MG tablet Take 100 mg by mouth 2 (two) times daily.     . colchicine 0.6 MG tablet Take 0.6 mg by mouth as needed.    . finasteride (PROSCAR) 5 MG tablet Take 5 mg by mouth daily.    [name] omeprazole (PRILOSEC) 20 MG capsule Take 20 mg by mouth daily as needed.     . simvastatin (ZOCOR) 40 MG tablet Take 20 mg by mouth at bedtime.      No current facility-administered medications for this visit.     Objective: BP 116/66 (BP Location: Left Arm, Patient Position: Sitting, Cuff Size: Large)   Pulse 60   Ht 5\' 10"  (1.778 m)   Wt 170 lb 9.6 oz (77.4 kg)   SpO2 96%   BMI 24.48 kg/m  Gen: NAD, resting comfortably, appears younger than stated age CV: RRR no murmurs rubs or gallops Lungs: CTAB no crackles, wheeze, rhonchi Abdomen: soft/nontender/nondistended/normal bowel sounds.  Ext: no edema Skin: warm, dry  Assessment/Plan:  Other notes: 1. [facility]- Patient continue to work on his balance daily. 2. Gets all meds through [place]. 1000 units vitamin D in winter months advised TC- can check next visit 3. Advised to restart  vitamin D- recheck next visit. He does 10k units a day  Gout S: 0 flares in 6 months on allopurinol 200 mg.  Has colchicine on hand as needed for flareups Lab Results  Component Value Date   LABURIC 5.2 03/18/2018  A/P: stable without flares- continue uric acid goal under 6.   BPH associated with nocturia S: Patient's BPH symptoms are managed by finasteride 5 mg.  He pees 3x a night which is slightly worse. Also deals with dribbling. Asks about increasing meds. Has had ultrasound at [facility] last marge- voids 80% of his urine.  A/P:we discussed flomax increases fall risk so cant really add that and finasteride is at max dose.  He asks about updating UA as sometimes he wonders about UTI.   Hyperlipidemia S: well controlled on simvastatin 20 mg Lab Results  Component Value Date   CHOL 140 01/22/2018   HDL 43 01/22/2018   LDLCALC 83 01/22/2018   TRIG 69 01/22/2018   A/P: Stable-continue current medications-at his age I think LDL goal under 100 is reasonable  GERD (gastroesophageal reflux disease) S: Acid reflux is well controlled on omeprazole 20 mg for prn use A/P: stable- continue prn use. He asks should he be on b12 and I encouraged him to continue 500 mcg due to PPI use   Return in about 6 months (around 04/07/2019) for  follow up- or sooner if needed.  Lab/Order associations: Polyuria - Plan: POCT Urinalysis Dipstick (Automated)  Chronic idiopathic gout involving toe without tophus, unspecified laterality  BPH associated with nocturia  Hyperlipidemia, unspecified hyperlipidemia type  Gastroesophageal reflux disease without esophagitis  Return precautions advised.  Garret Reddish, MD

## 2018-10-07 NOTE — Assessment & Plan Note (Signed)
S: Acid reflux is well controlled on omeprazole 20 mg for prn use A/P: stable- continue prn use. He asks should he be on b12 and I encouraged him to continue 500 mcg due to PPI use

## 2019-01-10 DIAGNOSIS — H1013 Acute atopic conjunctivitis, bilateral: Secondary | ICD-10-CM | POA: Diagnosis not present

## 2019-01-27 LAB — MICROALBUMIN, URINE: Microalb, Ur: 0.624

## 2019-01-27 LAB — LIPID PANEL
Cholesterol: 129 (ref 0–200)
HDL: 30 — AB (ref 35–70)
LDL Cholesterol: 78
Triglycerides: 103 (ref 40–160)

## 2019-01-27 LAB — HEMOGLOBIN A1C: Hemoglobin A1C: 6.3

## 2019-01-27 LAB — BASIC METABOLIC PANEL
BUN: 27 — AB (ref 4–21)
Creatinine: 1.1 (ref 0.6–1.3)
Glucose: 101
Sodium: 138 (ref 137–147)

## 2019-01-27 LAB — HEPATIC FUNCTION PANEL
ALT: 59 — AB (ref 10–40)
AST: 26 (ref 14–40)
Alkaline Phosphatase: 134 — AB (ref 25–125)
Bilirubin, Total: 0.5

## 2019-01-27 LAB — CBC AND DIFFERENTIAL
Neutrophils Absolute: 3
WBC: 6.5

## 2019-01-27 LAB — VITAMIN D 25 HYDROXY (VIT D DEFICIENCY, FRACTURES): Vit D, 25-Hydroxy: 31.73

## 2019-04-07 ENCOUNTER — Ambulatory Visit: Payer: Medicare Other | Admitting: Family Medicine

## 2019-04-24 ENCOUNTER — Telehealth: Payer: Self-pay | Admitting: Family Medicine

## 2019-04-24 NOTE — Telephone Encounter (Signed)
Called the patient to set up the Virtual Visit for his appt on 04/28/19 and he said he would rather come in to the office. He doesn't have a smart phone or a camera with a laptop and doesn't want to do a phone visit either. Please Advise.

## 2019-04-24 NOTE — Telephone Encounter (Signed)
Pt can come into the office as long as no F/C/C

## 2019-04-28 ENCOUNTER — Ambulatory Visit (INDEPENDENT_AMBULATORY_CARE_PROVIDER_SITE_OTHER): Payer: Medicare Other | Admitting: Family Medicine

## 2019-04-28 ENCOUNTER — Other Ambulatory Visit: Payer: Self-pay

## 2019-04-28 ENCOUNTER — Encounter: Payer: Self-pay | Admitting: Family Medicine

## 2019-04-28 VITALS — BP 111/67 | HR 59 | Temp 97.8°F | Ht 70.0 in | Wt 169.2 lb

## 2019-04-28 DIAGNOSIS — R739 Hyperglycemia, unspecified: Secondary | ICD-10-CM | POA: Diagnosis not present

## 2019-04-28 DIAGNOSIS — E559 Vitamin D deficiency, unspecified: Secondary | ICD-10-CM

## 2019-04-28 DIAGNOSIS — R351 Nocturia: Secondary | ICD-10-CM | POA: Diagnosis not present

## 2019-04-28 DIAGNOSIS — K219 Gastro-esophageal reflux disease without esophagitis: Secondary | ICD-10-CM

## 2019-04-28 DIAGNOSIS — N401 Enlarged prostate with lower urinary tract symptoms: Secondary | ICD-10-CM

## 2019-04-28 DIAGNOSIS — E785 Hyperlipidemia, unspecified: Secondary | ICD-10-CM | POA: Diagnosis not present

## 2019-04-28 DIAGNOSIS — M1A0790 Idiopathic chronic gout, unspecified ankle and foot, without tophus (tophi): Secondary | ICD-10-CM | POA: Diagnosis not present

## 2019-04-28 NOTE — Progress Notes (Signed)
Phone [phone]   Subjective:  Allen Robinson is a 85 y.o. year old very pleasant male patient who presents for/with See problem oriented charting Chief Complaint  Patient presents with  . Hyperlipidemia   ROS- still with balance issues. Occasionally uses cane. No fever, chills, cough, shortness of breath, body aches, sore throat, or loss of taste or smell   Past Medical History-  Patient Active Problem List   Diagnosis Date Noted  . Gout 03/14/2017    Priority: Medium  . BPH associated with nocturia 03/14/2017    Priority: Medium  . Hyperlipidemia 03/14/2017    Priority: Medium  . GERD (gastroesophageal reflux disease) 03/14/2017    Priority: Medium  . Hyperglycemia 04/28/2019    Priority: Low  . Vitamin D deficiency 03/14/2017    Priority: Low  . Peripheral polyneuropathy 02/16/2016    Priority: Low  . Diplopia 11/19/2015    Priority: Low  . Insect bite of left ankle 04/30/2017    Medications- reviewed and updated Current Outpatient Medications  Medication Sig Dispense Refill  . allopurinol (ZYLOPRIM) 100 MG tablet Take 100 mg by mouth 2 (two) times daily.     . Cholecalciferol (VITAMIN D3) 250 MCG (10000 UT) TABS Take 10,000 Units by mouth daily.    . colchicine 0.6 MG tablet Take 0.6 mg by mouth as needed.    . finasteride (PROSCAR) 5 MG tablet Take 5 mg by mouth daily.    [name] omeprazole (PRILOSEC) 20 MG capsule Take 20 mg by mouth daily as needed.     . simvastatin (ZOCOR) 40 MG tablet Take 20 mg by mouth at bedtime.     . vitamin B-12 (CYANOCOBALAMIN) 500 MCG tablet Take 500 mcg by mouth daily.     No current facility-administered medications for this visit.      Objective:  BP 111/67   Pulse (!) 59   Temp 97.8 F (36.6 C) (Oral)   Ht 5\' 10"  (1.778 m)   Wt 169 lb 3.2 oz (76.7 kg)   SpO2 96%   BMI 24.28 kg/m  Gen: NAD, resting comfortably, appears younger than stated age CV: RRR no murmurs rubs or gallops Lungs: CTAB no crackles, wheeze, rhonchi  Abdomen: soft/nontender/nondistended/normal bowel sounds. Ext: no edema Skin: warm, dry Neuro: able to stand and get onto table without assist though moves steadily and intentionally    Assessment and Plan   #Gout S: 0 flares in 2 years on allopurinol 200mg  daily. Has colchicine on hand Lab Results  Component Value Date   LABURIC 5.2 03/18/2018  A/P: Stable. Continue current medications.    #hyperlipidemia S:  controlled on simvastatin 40mg   A/P: brings labs from [place]- remains well controlled- will have them abstract labs.  Stable-continue current medication  # BPH S: patient remains on finasteride 5 mg. Nocturia pretty stable.  A/P: Stable. Continue current medications.   # GERD S: reasonably controlled on omeprazole 20mg . Breakthrough if does spicy foods - takes b12 daily- will skip checking b12 A/P: Stable. Continue current medications.    Hyperglycemia A1c at the [place] noted in prediabetes range-this will be abstracted down Lab Results  Component Value Date   HGBA1C 6.2 (H) 02/16/2016  This is consistent with prior elevations several years ago.  We discussed even if he develops diabetes likely would not start medication.  Do want him try to remain active and eat a healthy diet-he is going to try to do these things.  Has to be careful with his balance.  Has been told he has some neuropathy- this likely contributes to balance issues but I do not believe is clearly diabetic neuropathy.  We will continue to monitor  Vitamin D deficiency We will need to review labs once scan then- [facility] not [facility] it would be worth doing that at next visit.  Patient takes 10,000 units daily and want to make sure he does not run too high  Future Appointments  Date Time Provider [place]  10/28/2019 10:40 AM Marin Olp, MD [facility]   Lab/Order associations: Hyperlipidemia, unspecified hyperlipidemia type  Chronic idiopathic gout involving  toe without tophus, unspecified laterality  Gastroesophageal reflux disease without esophagitis  BPH associated with nocturia  Hyperglycemia  Vitamin D deficiency  Return precautions advised.  Garret Reddish, MD

## 2019-04-28 NOTE — Assessment & Plan Note (Signed)
A1c at the [place] noted in prediabetes range-this will be abstracted down Lab Results  Component Value Date   HGBA1C 6.2 (H) 02/16/2016  This is consistent with prior elevations several years ago.  We discussed even if he develops diabetes likely would not start medication.  Do want him try to remain active and eat a healthy diet-he is going to try to do these things.  Has to be careful with his balance.  Has been told he has some neuropathy- this likely contributes to balance issues but I do not believe is clearly diabetic neuropathy.  We will continue to monitor

## 2019-04-28 NOTE — Assessment & Plan Note (Addendum)
We will need to review labs once scan then- if the [facility] not [facility] a [facility] level it would be worth doing that at next visit.  Patient takes 10,000 units daily and want to make sure he does not run too high

## 2019-04-28 NOTE — Patient Instructions (Addendum)
Lets see each other sometime between now and march- perhaps halfway through then we can alternate and see [place] just once a year and [facility] once a [facility] but have someone lay eyes on you every 6 months  No change in medicines today

## 2019-05-15 ENCOUNTER — Emergency Department (HOSPITAL_COMMUNITY): Payer: Medicare Other

## 2019-05-15 ENCOUNTER — Observation Stay (HOSPITAL_COMMUNITY)
Admission: EM | Admit: 2019-05-15 | Discharge: 2019-05-16 | Disposition: A | Discharge status: Home / Self Care | Payer: Medicare Other | Attending: Internal Medicine | Admitting: Internal Medicine

## 2019-05-15 ENCOUNTER — Other Ambulatory Visit: Payer: Self-pay

## 2019-05-15 DIAGNOSIS — Y92009 Unspecified place in unspecified non-institutional (private) residence as the place of occurrence of the external cause: Secondary | ICD-10-CM | POA: Diagnosis not present

## 2019-05-15 DIAGNOSIS — Z1159 Encounter for screening for other viral diseases: Secondary | ICD-10-CM | POA: Insufficient documentation

## 2019-05-15 DIAGNOSIS — S2241XA Multiple fractures of ribs, right side, initial encounter for closed fracture: Secondary | ICD-10-CM | POA: Diagnosis not present

## 2019-05-15 DIAGNOSIS — R351 Nocturia: Secondary | ICD-10-CM | POA: Insufficient documentation

## 2019-05-15 DIAGNOSIS — K449 Diaphragmatic hernia without obstruction or gangrene: Secondary | ICD-10-CM | POA: Diagnosis not present

## 2019-05-15 DIAGNOSIS — Z79899 Other long term (current) drug therapy: Secondary | ICD-10-CM | POA: Insufficient documentation

## 2019-05-15 DIAGNOSIS — E785 Hyperlipidemia, unspecified: Secondary | ICD-10-CM | POA: Diagnosis not present

## 2019-05-15 DIAGNOSIS — E559 Vitamin D deficiency, unspecified: Secondary | ICD-10-CM | POA: Diagnosis not present

## 2019-05-15 DIAGNOSIS — W19XXXA Unspecified fall, initial encounter: Secondary | ICD-10-CM

## 2019-05-15 DIAGNOSIS — N401 Enlarged prostate with lower urinary tract symptoms: Secondary | ICD-10-CM | POA: Diagnosis present

## 2019-05-15 DIAGNOSIS — R2681 Unsteadiness on feet: Secondary | ICD-10-CM | POA: Insufficient documentation

## 2019-05-15 DIAGNOSIS — M109 Gout, unspecified: Secondary | ICD-10-CM | POA: Diagnosis present

## 2019-05-15 DIAGNOSIS — K219 Gastro-esophageal reflux disease without esophagitis: Secondary | ICD-10-CM | POA: Diagnosis present

## 2019-05-15 DIAGNOSIS — K769 Liver disease, unspecified: Secondary | ICD-10-CM | POA: Diagnosis not present

## 2019-05-15 DIAGNOSIS — S2231XA Fracture of one rib, right side, initial encounter for closed fracture: Secondary | ICD-10-CM | POA: Diagnosis present

## 2019-05-15 DIAGNOSIS — W010XXA Fall on same level from slipping, tripping and stumbling without subsequent striking against object, initial encounter: Secondary | ICD-10-CM | POA: Diagnosis not present

## 2019-05-15 DIAGNOSIS — N400 Enlarged prostate without lower urinary tract symptoms: Secondary | ICD-10-CM | POA: Diagnosis not present

## 2019-05-15 DIAGNOSIS — R109 Unspecified abdominal pain: Secondary | ICD-10-CM | POA: Diagnosis not present

## 2019-05-15 DIAGNOSIS — Z03818 Encounter for observation for suspected exposure to other biological agents ruled out: Secondary | ICD-10-CM | POA: Diagnosis not present

## 2019-05-15 LAB — CBC WITH DIFFERENTIAL/PLATELET
Abs Immature Granulocytes: 0.02 10*3/uL (ref 0.00–0.07)
Basophils Absolute: 0 10*3/uL (ref 0.0–0.1)
Basophils Relative: 0 %
Eosinophils Absolute: 0.1 10*3/uL (ref 0.0–0.5)
Eosinophils Relative: 1 %
HCT: 38.1 % — ABNORMAL LOW (ref 39.0–52.0)
Hemoglobin: 12.4 g/dL — ABNORMAL LOW (ref 13.0–17.0)
Immature Granulocytes: 0 %
Lymphocytes Relative: 20 %
Lymphs Abs: 1.6 10*3/uL (ref 0.7–4.0)
MCH: 30.7 pg (ref 26.0–34.0)
MCHC: 32.5 g/dL (ref 30.0–36.0)
MCV: 94.3 fL (ref 80.0–100.0)
Monocytes Absolute: 0.7 10*3/uL (ref 0.1–1.0)
Monocytes Relative: 8 %
Neutro Abs: 5.8 10*3/uL (ref 1.7–7.7)
Neutrophils Relative %: 71 %
Platelets: 139 10*3/uL — ABNORMAL LOW (ref 150–400)
RBC: 4.04 MIL/uL — ABNORMAL LOW (ref 4.22–5.81)
RDW: 13.3 % (ref 11.5–15.5)
WBC: 8.2 10*3/uL (ref 4.0–10.5)
nRBC: 0 % (ref 0.0–0.2)

## 2019-05-15 LAB — SARS CORONAVIRUS 2 BY RT PCR (HOSPITAL ORDER, PERFORMED IN ~~LOC~~ HOSPITAL LAB): SARS Coronavirus 2: NEGATIVE

## 2019-05-15 LAB — BASIC METABOLIC PANEL
Anion gap: 9 (ref 5–15)
BUN: 22 mg/dL (ref 8–23)
CO2: 24 mmol/L (ref 22–32)
Calcium: 8.9 mg/dL (ref 8.9–10.3)
Chloride: 107 mmol/L (ref 98–111)
Creatinine, Ser: 1.11 mg/dL (ref 0.61–1.24)
GFR calc Af Amer: >60 mL/min (ref >60)
GFR calc non Af Amer: 58 mL/min — ABNORMAL LOW (ref >60)
Glucose, Bld: 136 mg/dL — ABNORMAL HIGH (ref 70–99)
Potassium: 3.5 mmol/L (ref 3.5–5.1)
Sodium: 140 mmol/L (ref 135–145)

## 2019-05-15 LAB — URINALYSIS, ROUTINE W REFLEX MICROSCOPIC
Bilirubin Urine: NEGATIVE
Glucose, UA: NEGATIVE mg/dL
Hgb urine dipstick: NEGATIVE
Ketones, ur: NEGATIVE mg/dL
Leukocytes,Ua: NEGATIVE
Nitrite: NEGATIVE
Protein, ur: NEGATIVE mg/dL
Specific Gravity, Urine: 1.023 (ref 1.005–1.030)
pH: 5 (ref 5.0–8.0)

## 2019-05-15 LAB — PROTIME-INR
INR: 1.1 (ref 0.8–1.2)
Prothrombin Time: 13.7 seconds (ref 11.4–15.2)

## 2019-05-15 MED ORDER — COLCHICINE 0.6 MG PO TABS: 0.6000 mg | ORAL_TABLET | ORAL | Status: DC | PRN

## 2019-05-15 MED ORDER — ONDANSETRON HCL 4 MG/2ML IJ SOLN: 4.0000 mg | Freq: Four times a day (QID) | INTRAMUSCULAR | Status: DC | PRN

## 2019-05-15 MED ORDER — ALLOPURINOL 100 MG PO TABS
100.0000 mg | ORAL_TABLET | Freq: Two times a day (BID) | ORAL | Status: DC
  Administered 2019-05-15 – 2019-05-16 (×2): 100 mg via ORAL
  Filled 2019-05-15 (×2): qty 1

## 2019-05-15 MED ORDER — FINASTERIDE 5 MG PO TABS
5.0000 mg | ORAL_TABLET | Freq: Every day | ORAL | Status: DC
  Administered 2019-05-16: 5 mg via ORAL
  Filled 2019-05-15: qty 1

## 2019-05-15 MED ORDER — IOHEXOL 300 MG/ML  SOLN
100.0000 mL | Freq: Once | INTRAMUSCULAR | Status: AC | PRN
  Administered 2019-05-15: 100 mL via INTRAVENOUS

## 2019-05-15 MED ORDER — SENNOSIDES-DOCUSATE SODIUM 8.6-50 MG PO TABS: 1.0000 | ORAL_TABLET | Freq: Every evening | ORAL | Status: DC | PRN

## 2019-05-15 MED ORDER — ACETAMINOPHEN 325 MG PO TABS: 650.0000 mg | ORAL_TABLET | Freq: Four times a day (QID) | ORAL | Status: DC | PRN

## 2019-05-15 MED ORDER — PANTOPRAZOLE SODIUM 40 MG PO TBEC
40.0000 mg | DELAYED_RELEASE_TABLET | Freq: Every day | ORAL | Status: DC
  Administered 2019-05-16: 40 mg via ORAL
  Filled 2019-05-15: qty 1

## 2019-05-15 MED ORDER — OXYCODONE-ACETAMINOPHEN 5-325 MG PO TABS: 1.0000 | ORAL_TABLET | ORAL | Status: DC | PRN

## 2019-05-15 MED ORDER — ONDANSETRON HCL 4 MG PO TABS: 4.0000 mg | ORAL_TABLET | Freq: Four times a day (QID) | ORAL | Status: DC | PRN

## 2019-05-15 MED ORDER — VITAMIN B-12 1000 MCG PO TABS
500.0000 ug | ORAL_TABLET | Freq: Every day | ORAL | Status: DC
  Administered 2019-05-16: 500 ug via ORAL
  Filled 2019-05-15: qty 1

## 2019-05-15 MED ORDER — SIMVASTATIN 20 MG PO TABS
20.0000 mg | ORAL_TABLET | Freq: Every day | ORAL | Status: DC
  Administered 2019-05-15: 20 mg via ORAL
  Filled 2019-05-15: qty 1

## 2019-05-15 MED ORDER — VITAMIN D (ERGOCALCIFEROL) 1.25 MG (50000 UNIT) PO CAPS
50000.0000 [IU] | ORAL_CAPSULE | ORAL | Status: DC
  Administered 2019-05-16: 50000 [IU] via ORAL
  Filled 2019-05-15: qty 1

## 2019-05-15 MED ORDER — ACETAMINOPHEN 650 MG RE SUPP: 650.0000 mg | Freq: Four times a day (QID) | RECTAL | Status: DC | PRN

## 2019-05-15 NOTE — ED Notes (Signed)
Family at bedside. 

## 2019-05-15 NOTE — ED Provider Notes (Signed)
[facility] Provider Note   CSN: [id number] Arrival date & time: 05/15/19  1619    History   Chief Complaint Chief Complaint  Patient presents with  . Fall    HPI Allen Robinson is a 85 y.o. male who presents to the ED today complaining of right sided pain s/p mechanical fall that occurred 1-2 days ago. Pt reports he was walking out of the door when it slammed closed on him, causing him to lose his balance and fall over. Pt landed on the arm of a chair on his right side; he reports bruising to the area since then. He is concerned he could have broken a rib. Denies shortness of breath. Pt also notes an abrasion to his right ankle; on pain to the ankle. Pt is no anticoagulated. Up to date on tetanus.        Past Medical History:  Diagnosis Date  . Arthritis    hands  . Chicken pox   . Hyperlipidemia   . Pneumonia   . Seasonal allergies     Patient Active Problem List   Diagnosis Date Noted  . Fall at home, initial encounter 05/15/2019  . Right rib fracture 05/15/2019  . Fall 05/15/2019  . Hyperglycemia 04/28/2019  . Insect bite of left ankle 04/30/2017  . Gout 03/14/2017  . Vitamin D deficiency 03/14/2017  . BPH associated with nocturia 03/14/2017  . Hyperlipidemia 03/14/2017  . GERD (gastroesophageal reflux disease) 03/14/2017  . Peripheral polyneuropathy 02/16/2016  . Diplopia 11/19/2015    Past Surgical History:  Procedure Laterality Date  . TONSILLECTOMY     1936        Home Medications    Prior to Admission medications   Medication Sig Start Date End Date Taking? Authorizing Provider  allopurinol (ZYLOPRIM) 100 MG tablet Take 100 mg by mouth 2 (two) times daily.     [provider]  Cholecalciferol (VITAMIN D3) 250 MCG (10000 UT) TABS Take 10,000 Units by mouth daily.    [provider]  colchicine 0.6 MG tablet Take 0.6 mg by mouth as needed.    [provider]  finasteride  (PROSCAR) 5 MG tablet Take 5 mg by mouth daily.    [provider]  omeprazole (PRILOSEC) 20 MG capsule Take 20 mg by mouth daily as needed.     [provider]  simvastatin (ZOCOR) 40 MG tablet Take 20 mg by mouth at bedtime.     [provider]  vitamin B-12 (CYANOCOBALAMIN) 500 MCG tablet Take 500 mcg by mouth daily.    [provider]    Family History Family History  Problem Relation Age of Onset  . Heart failure Mother   . Pneumonia Mother        died 84  . Stroke Father        83  . Neuropathy Neg Hx     Social History Social History   Tobacco Use  . Smoking status: Never Smoker  . Smokeless tobacco: Never Used  Substance Use Topics  . Alcohol use: Yes    Alcohol/week: 0.0 standard drinks    Comment: no longer drinking. prior social  . Drug use: No     Allergies   Patient has no known allergies.   Review of Systems Review of Systems  Constitutional: Negative for chills and fever.  HENT: Negative for congestion.   Eyes: Negative for visual disturbance.  Respiratory: Negative for cough and shortness of breath.  Cardiovascular: Negative for chest pain.  Gastrointestinal: Negative for constipation, diarrhea, nausea and vomiting.  Genitourinary: Positive for flank pain (right).  Musculoskeletal: Negative for back pain.  Skin: Positive for wound (abrasion to right ankle).  Neurological: Negative for dizziness, syncope, light-headedness and headaches.  Hematological: Does not bruise/bleed easily.     Physical Exam Updated Vital Signs BP (!) 148/83 (BP Location: Left Arm)   Pulse 76   Temp 98.3 F (36.8 C) (Oral)   Resp 18   Ht 5\' 10"  (1.778 m)   Wt 76.2 kg   SpO2 95%   BMI 24.11 kg/m   Physical Exam Vitals signs and nursing note reviewed.  Constitutional:      Appearance: He is not ill-appearing.  HENT:     Head: Normocephalic and atraumatic.  Eyes:     Conjunctiva/sclera: Conjunctivae normal.  Neck:      Musculoskeletal: Neck supple.  Cardiovascular:     Rate and Rhythm: Normal rate and regular rhythm.  Pulmonary:     Effort: Pulmonary effort is normal.     Breath sounds: Normal breath sounds.     Comments: No crepitus appreciated over right ribs laterally Abdominal:     Palpations: Abdomen is soft.     Tenderness: There is no abdominal tenderness. There is no guarding or rebound.     Comments: Large ecchymosis noted to right flank area with TTP; no abdominal tenderness  Musculoskeletal:     Comments: No C, T, or L midline spinal tenderness on exam. No tenderness to all joints including shoulders, elbows, wrists, hips, knees, and ankles.   Skin:    General: Skin is warm and dry.  Neurological:     Mental Status: He is alert.      ED Treatments / Results  Labs (all labs ordered are listed, but only abnormal results are displayed) Labs Reviewed  BASIC METABOLIC PANEL - Abnormal; Notable for the following components:      Result Value   Glucose, Bld 136 (*)    GFR calc non Af Amer 58 (*)    All other components within normal limits  CBC WITH DIFFERENTIAL/PLATELET - Abnormal; Notable for the following components:   RBC 4.04 (*)    Hemoglobin 12.4 (*)    HCT 38.1 (*)    Platelets 139 (*)    All other components within normal limits  SARS CORONAVIRUS 2 (HOSPITAL ORDER, [facility])  URINALYSIS, ROUTINE W REFLEX MICROSCOPIC    EKG EKG Interpretation  Date/Time:  Thursday May 15 2019 17:02:50 EDT Ventricular Rate:  70 PR Interval:    QRS Duration: 93 QT Interval:  390 QTC Calculation: 421 R Axis:   -60 Text Interpretation:  Sinus rhythm Left anterior fascicular block Low voltage, precordial leads Consider anterior infarct Confirmed by Gerlene Fee [id number]) on 05/15/2019 7:06:29 PM   Radiology Ct Chest W Contrast  Result Date: 05/15/2019 CLINICAL DATA:  Status post fall from standing yesterday with right side pain. EXAM: CT CHEST, ABDOMEN, AND  PELVIS WITH CONTRAST TECHNIQUE: Multidetector CT imaging of the chest, abdomen and pelvis was performed following the standard protocol during bolus administration of intravenous contrast. CONTRAST:  137mL OMNIPAQUE IOHEXOL 300 MG/ML  SOLN COMPARISON:  No comparison studies available. FINDINGS: CT CHEST FINDINGS Cardiovascular: The heart size is normal. No substantial pericardial effusion. Coronary artery calcification is evident. Atherosclerotic calcification is noted in the wall of the thoracic aorta. Mediastinum/Nodes: No mediastinal lymphadenopathy. There is no hilar lymphadenopathy. The esophagus has  normal imaging features. Small to moderate hiatal hernia noted. There is no axillary lymphadenopathy. Lungs/Pleura: No evidence for pneumothorax. Tiny right pleural effusion noted. There is atelectasis in the posterior lung bases bilaterally. No suspicious pulmonary nodule or mass. Musculoskeletal: Acute fractures identified posterolateral right ninth rib and there is a segmental fracture of the posterior right tenth rib. No evidence for thoracic spine fracture. No left rib fracture evident. CT ABDOMEN PELVIS FINDINGS Hepatobiliary: 7 mm hypoattenuating lesion in the medial segment left liver is too small to characterize but likely benign. Liver otherwise unremarkable. There is no evidence for gallstones, gallbladder wall thickening, or pericholecystic fluid. No intrahepatic or extrahepatic biliary dilation. Pancreas: No focal mass lesion. No dilatation of the main duct. No intraparenchymal cyst. No peripancreatic edema. Spleen: No splenomegaly. No focal mass lesion. Adrenals/Urinary Tract: No adrenal nodule or mass. Kidneys unremarkable. 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 The urinary bladder appears normal for the degree of distention. Stomach/Bowel: Small to moderate hiatal hernia. Stomach otherwise unremarkable. Duodenum is  normally positioned as is the ligament of Treitz. No small bowel wall thickening. No small bowel dilatation. The terminal ileum is normal. The appendix is normal. No gross colonic mass. No colonic wall thickening. Diverticular changes are noted in the right colon without diverticulitis. Prominent stool volume evident. Vascular/Lymphatic: There is abdominal aortic atherosclerosis without aneurysm. There is no gastrohepatic or hepatoduodenal ligament lymphadenopathy. No intraperitoneal or retroperitoneal lymphadenopathy. Left common iliac artery measures up to 1.7 cm diameter. No pelvic sidewall lymphadenopathy. Reproductive: Prostate gland is enlarged. Other: No intraperitoneal free fluid. Musculoskeletal: No evidence for an acute fracture in the bony pelvis. No evidence for lumbar spine fracture. Degenerative disc disease noted at lower lumbar levels. IMPRESSION: 1. Acute fracture of the posterolateral right ninth rib and a segmental fracture of the posterior right tenth rib fifth which shows about 1 shaft with of inferior displacement of the segmental fragment on coronal images (see images 87 and 88 of series 6). 2. No pneumothorax but there is a tiny right pleural effusion. Atelectasis noted in the posterior lower lobes bilaterally. 3. No other acute traumatic abnormality identified in the chest, abdomen, or pelvis. 4. Small to moderate hiatal hernia. 5. Prostatomegaly. 6.  Aortic Atherosclerois (ICD10-170.0) Electronically Signed   By: Misty Stanley M.D.   On: 05/15/2019 19:45   Ct Abdomen Pelvis W Contrast  Result Date: 05/15/2019 CLINICAL DATA:  Status post fall from standing yesterday with right side pain. EXAM: CT CHEST, ABDOMEN, AND PELVIS WITH CONTRAST TECHNIQUE: Multidetector CT imaging of the chest, abdomen and pelvis was performed following the standard protocol during bolus administration of intravenous contrast. CONTRAST:  182mL OMNIPAQUE IOHEXOL 300 MG/ML  SOLN COMPARISON:  No comparison studies  available. FINDINGS: CT CHEST FINDINGS Cardiovascular: The heart size is normal. No substantial pericardial effusion. Coronary artery calcification is evident. Atherosclerotic calcification is noted in the wall of the thoracic aorta. Mediastinum/Nodes: No mediastinal lymphadenopathy. There is no hilar lymphadenopathy. The esophagus has normal imaging features. Small to moderate hiatal hernia noted. There is no axillary lymphadenopathy. Lungs/Pleura: No evidence for pneumothorax. Tiny right pleural effusion noted. There is atelectasis in the posterior lung bases bilaterally. No suspicious pulmonary nodule or mass. Musculoskeletal: Acute fractures identified posterolateral right ninth rib and there is a segmental fracture of the posterior right tenth rib. No evidence for thoracic spine fracture. No left rib fracture evident. CT ABDOMEN PELVIS FINDINGS Hepatobiliary: 7 mm hypoattenuating lesion in the  medial segment left liver is too small to characterize but likely benign. Liver otherwise unremarkable. There is no evidence for gallstones, gallbladder wall thickening, or pericholecystic fluid. No intrahepatic or extrahepatic biliary dilation. Pancreas: No focal mass lesion. No dilatation of the main duct. No intraparenchymal cyst. No peripancreatic edema. Spleen: No splenomegaly. No focal mass lesion. Adrenals/Urinary Tract: No adrenal nodule or mass. Kidneys unremarkable. 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 The urinary bladder appears normal for the degree of distention. Stomach/Bowel: Small to moderate hiatal hernia. Stomach otherwise unremarkable. Duodenum is normally positioned as is the ligament of Treitz. No small bowel wall thickening. No small bowel dilatation. The terminal ileum is normal. The appendix is normal. No gross colonic mass. No colonic wall thickening. Diverticular changes are noted in the right colon without  diverticulitis. Prominent stool volume evident. Vascular/Lymphatic: There is abdominal aortic atherosclerosis without aneurysm. There is no gastrohepatic or hepatoduodenal ligament lymphadenopathy. No intraperitoneal or retroperitoneal lymphadenopathy. Left common iliac artery measures up to 1.7 cm diameter. No pelvic sidewall lymphadenopathy. Reproductive: Prostate gland is enlarged. Other: No intraperitoneal free fluid. Musculoskeletal: No evidence for an acute fracture in the bony pelvis. No evidence for lumbar spine fracture. Degenerative disc disease noted at lower lumbar levels. IMPRESSION: 1. Acute fracture of the posterolateral right ninth rib and a segmental fracture of the posterior right tenth rib fifth which shows about 1 shaft with of inferior displacement of the segmental fragment on coronal images (see images 87 and 88 of series 6). 2. No pneumothorax but there is a tiny right pleural effusion. Atelectasis noted in the posterior lower lobes bilaterally. 3. No other acute traumatic abnormality identified in the chest, abdomen, or pelvis. 4. Small to moderate hiatal hernia. 5. Prostatomegaly. 6.  Aortic Atherosclerois (ICD10-170.0) Electronically Signed   By: Misty Stanley M.D.   On: 05/15/2019 19:45    Procedures Procedures (including critical care time)  Medications Ordered in ED Medications  oxyCODONE-acetaminophen (PERCOCET/ROXICET) 5-325 MG per tablet 1 tablet (has no administration in time range)  iohexol (OMNIPAQUE) 300 MG/ML solution 100 mL (100 mLs Intravenous Contrast Given 05/15/19 1900)     Initial Impression / Assessment and Plan / ED Course  I have reviewed the triage vital signs and the nursing notes.  Pertinent labs & imaging results that were available during my care of the patient were reviewed by me and considered in my medical decision making (see chart for details).  Clinical Course as of May 15 2027  Thu May 15, 2019  2016 Discussed case with hospitalist who will  admit; would like me to place consult to cardiothoracic surgeon vs trauma surgeon given displacement and age.    [MV]  2023 Discussed case with Dr. Roxan Hockey with CT surgery; would like to talk to hospitalist directly; states there is nothing else to do for this patient besides pain medication and obvs    [MV]    Clinical Course User Index [MV] Eustaquio Maize, PA-C   85 year old male presenting to the ED s/p fall that occurred 1-2 days ago. He fell onto his right side on the arm of a chair; no head injury or LOC; pt not anticoagulated; has large ecchymosis to right flank. No tenderness to right ribs; no crepitus. Concerning for retroperitoneal hemorrhage vs multiple rib fractures; will obtain CT chest and CT A/P at this time. Baseline bloodwork also obtained.   Bloodwork reassuring today; gemoglobin stable; no electrolyte abnormalities. No hemoglobin  appreciated in urine. CT Chest does show 2 rib fractures to 9th and 10th rib with  Minimal displacement inferiorly. Will consult hospitalist for admission given pt's age and risk for complications.        Final Clinical Impressions(s) / ED Diagnoses   Final diagnoses:  Closed fracture of multiple ribs of right side, initial encounter    ED Discharge Orders    None       Eustaquio Maize, PA-C 05/15/19 2035    Maudie Flakes, MD 05/21/19 [phone]

## 2019-05-15 NOTE — Progress Notes (Signed)
Admitted to room 15 from ED, alert and oriented X4, able to verbalize needs, complain of pain 4/10 on right flank when moving. Oriented to room and staff, daughter at bedside.

## 2019-05-15 NOTE — ED Notes (Signed)
ED TO INPATIENT HANDOFF REPORT  ED Nurse Name and Phone #: Caprice Kluver [phone]  S Name/Age/Gender Allen Robinson 85 y.o. male Room/Bed: 043C/043C  Code Status   Code Status: Not on file  Home/SNF/Other Home Patient oriented to: self, place, time and situation Is this baseline? Yes   Triage Complete: Triage complete  Chief Complaint Fall; Rib Fx  Triage Note Pt here after fall yesterday where he lost his balance and fell on his right side and sts he has broken some ribs. Denies shortness of breath.    Allergies No Known Allergies  Level of Care/Admitting Diagnosis ED Disposition    ED Disposition Condition Comment   Admit  Hospital Area: [facility] [[id number]]  Level of Care: Telemetry Medical [104]  I expect the patient will be discharged within 24 hours: No (not a candidate for 5C-Observation unit)  Covid Evaluation: Screening Protocol (No Symptoms)  Diagnosis: Fall [290176]  Admitting Physician: Ivor Costa [[id number]]  Attending Physician: Ivor Costa [[id number]]  PT Class (Do Not Modify): Observation [104]  PT Acc Code (Do Not Modify): Observation [10022]       B Medical/Surgery History Past Medical History:  Diagnosis Date  . Arthritis    hands  . Chicken pox   . Hyperlipidemia   . Pneumonia   . Seasonal allergies    Past Surgical History:  Procedure Laterality Date  . TONSILLECTOMY     1936     A IV Location/Drains/Wounds Patient Lines/Drains/Airways Status   Active Line/Drains/Airways    Name:   Placement date:   Placement time:   Site:   Days:   Peripheral IV 05/15/19 Right Antecubital   05/15/19    1819    Antecubital   less than 1          Intake/Output Last 24 hours No intake or output data in the 24 hours ending 05/15/19 2108  Labs/Imaging Results for orders placed or performed during the hospital encounter of 05/15/19 (from the past 48 hour(s))  Basic metabolic panel     Status: Abnormal   Collection Time: 05/15/19  4:57  PM  Result Value Ref Range   Sodium 140 135 - 145 mmol/L   Potassium 3.5 3.5 - 5.1 mmol/L   Chloride 107 98 - 111 mmol/L   CO2 24 22 - 32 mmol/L   Glucose, Bld 136 (H) 70 - 99 mg/dL   BUN 22 8 - 23 mg/dL   Creatinine, Ser 1.11 0.61 - 1.24 mg/dL   Calcium 8.9 8.9 - 10.3 mg/dL   GFR calc non Af Amer 58 (L) >60 mL/min   GFR calc Af Amer >60 >60 mL/min   Anion gap 9 5 - 15    Comment: Performed at [facility], [place] [address], [place], [place] [address]  CBC with Differential     Status: Abnormal   Collection Time: 05/15/19  4:57 PM  Result Value Ref Range   WBC 8.2 4.0 - 10.5 K/uL   RBC 4.04 (L) 4.22 - 5.81 MIL/uL   Hemoglobin 12.4 (L) 13.0 - 17.0 g/dL   HCT 38.1 (L) 39.0 - 52.0 %   MCV 94.3 80.0 - 100.0 fL   MCH 30.7 26.0 - 34.0 pg   MCHC 32.5 30.0 - 36.0 g/dL   RDW 13.3 11.5 - 15.5 %   Platelets 139 (L) 150 - 400 K/uL   nRBC 0.0 0.0 - 0.2 %   Neutrophils Relative % 71 %   Neutro Abs 5.8  1.7 - 7.7 K/uL   Lymphocytes Relative 20 %   Lymphs Abs 1.6 0.7 - 4.0 K/uL   Monocytes Relative 8 %   Monocytes Absolute 0.7 0.1 - 1.0 K/uL   Eosinophils Relative 1 %   Eosinophils Absolute 0.1 0.0 - 0.5 K/uL   Basophils Relative 0 %   Basophils Absolute 0.0 0.0 - 0.1 K/uL   Immature Granulocytes 0 %   Abs Immature Granulocytes 0.02 0.00 - 0.07 K/uL    Comment: Performed at [facility] [address] [place], [place] [address]  Urinalysis, Routine w reflex microscopic     Status: None   Collection Time: 05/15/19  6:15 PM  Result Value Ref Range   Color, Urine YELLOW YELLOW   APPearance CLEAR CLEAR   Specific Gravity, Urine 1.023 1.005 - 1.030   pH 5.0 5.0 - 8.0   Glucose, UA NEGATIVE NEGATIVE mg/dL   Hgb urine dipstick NEGATIVE NEGATIVE   Bilirubin Urine NEGATIVE NEGATIVE   Ketones, ur NEGATIVE NEGATIVE mg/dL   Protein, ur NEGATIVE NEGATIVE mg/dL   Nitrite NEGATIVE NEGATIVE   Leukocytes,Ua NEGATIVE NEGATIVE    Comment: Performed at [facility]  [address] [place],  [address]   Ct Chest W Contrast  Result Date: 05/15/2019 CLINICAL DATA:  Status post fall from standing yesterday with right side pain. EXAM: CT CHEST, ABDOMEN, AND PELVIS WITH CONTRAST TECHNIQUE: Multidetector CT imaging of the chest, abdomen and pelvis was performed following the standard protocol during bolus administration of intravenous contrast. CONTRAST:  143mL OMNIPAQUE IOHEXOL 300 MG/ML  SOLN COMPARISON:  No comparison studies available. FINDINGS: CT CHEST FINDINGS Cardiovascular: The heart size is normal. No substantial pericardial effusion. Coronary artery calcification is evident. Atherosclerotic calcification is noted in the wall of the thoracic aorta. Mediastinum/Nodes: No mediastinal lymphadenopathy. There is no hilar lymphadenopathy. The esophagus has normal imaging features. Small to moderate hiatal hernia noted. There is no axillary lymphadenopathy. Lungs/Pleura: No evidence for pneumothorax. Tiny right pleural effusion noted. There is atelectasis in the posterior lung bases bilaterally. No suspicious pulmonary nodule or mass. Musculoskeletal: Acute fractures identified posterolateral right ninth rib and there is a segmental fracture of the posterior right tenth rib. No evidence for thoracic spine fracture. No left rib fracture evident. CT ABDOMEN PELVIS FINDINGS Hepatobiliary: 7 mm hypoattenuating lesion in the medial segment left liver is too small to characterize but likely benign. Liver otherwise unremarkable. There is no evidence for gallstones, gallbladder wall thickening, or pericholecystic fluid. No intrahepatic or extrahepatic biliary dilation. Pancreas: No focal mass lesion. No dilatation of the main duct. No intraparenchymal cyst. No peripancreatic edema. Spleen: No splenomegaly. No focal mass lesion. Adrenals/Urinary Tract: No adrenal nodule or mass. Kidneys unremarkable. 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 The urinary bladder appears normal for the degree of distention. Stomach/Bowel: Small to moderate hiatal hernia. Stomach otherwise unremarkable. Duodenum is normally positioned as is the ligament of Treitz. No small bowel wall thickening. No small bowel dilatation. The terminal ileum is normal. The appendix is normal. No gross colonic mass. No colonic wall thickening. Diverticular changes are noted in the right colon without diverticulitis. Prominent stool volume evident. Vascular/Lymphatic: There is abdominal aortic atherosclerosis without aneurysm. There is no gastrohepatic or hepatoduodenal ligament lymphadenopathy. No intraperitoneal or retroperitoneal lymphadenopathy. Left common iliac artery measures up to 1.7 cm diameter. No pelvic sidewall lymphadenopathy. Reproductive: Prostate gland is enlarged. Other: No intraperitoneal free fluid. Musculoskeletal:  No evidence for an acute fracture in the bony pelvis. No evidence for lumbar spine fracture. Degenerative disc disease noted at lower lumbar levels. IMPRESSION: 1. Acute fracture of the posterolateral right ninth rib and a segmental fracture of the posterior right tenth rib fifth which shows about 1 shaft with of inferior displacement of the segmental fragment on coronal images (see images 87 and 88 of series 6). 2. No pneumothorax but there is a tiny right pleural effusion. Atelectasis noted in the posterior lower lobes bilaterally. 3. No other acute traumatic abnormality identified in the chest, abdomen, or pelvis. 4. Small to moderate hiatal hernia. 5. Prostatomegaly. 6.  Aortic Atherosclerois (ICD10-170.0) Electronically Signed   By: Misty Stanley M.D.   On: 05/15/2019 19:45   Ct Abdomen Pelvis W Contrast  Result Date: 05/15/2019 CLINICAL DATA:  Status post fall from standing yesterday with right side pain. EXAM: CT CHEST, ABDOMEN, AND PELVIS WITH CONTRAST TECHNIQUE: Multidetector CT imaging of the chest, abdomen and  pelvis was performed following the standard protocol during bolus administration of intravenous contrast. CONTRAST:  126mL OMNIPAQUE IOHEXOL 300 MG/ML  SOLN COMPARISON:  No comparison studies available. FINDINGS: CT CHEST FINDINGS Cardiovascular: The heart size is normal. No substantial pericardial effusion. Coronary artery calcification is evident. Atherosclerotic calcification is noted in the wall of the thoracic aorta. Mediastinum/Nodes: No mediastinal lymphadenopathy. There is no hilar lymphadenopathy. The esophagus has normal imaging features. Small to moderate hiatal hernia noted. There is no axillary lymphadenopathy. Lungs/Pleura: No evidence for pneumothorax. Tiny right pleural effusion noted. There is atelectasis in the posterior lung bases bilaterally. No suspicious pulmonary nodule or mass. Musculoskeletal: Acute fractures identified posterolateral right ninth rib and there is a segmental fracture of the posterior right tenth rib. No evidence for thoracic spine fracture. No left rib fracture evident. CT ABDOMEN PELVIS FINDINGS Hepatobiliary: 7 mm hypoattenuating lesion in the medial segment left liver is too small to characterize but likely benign. Liver otherwise unremarkable. There is no evidence for gallstones, gallbladder wall thickening, or pericholecystic fluid. No intrahepatic or extrahepatic biliary dilation. Pancreas: No focal mass lesion. No dilatation of the main duct. No intraparenchymal cyst. No peripancreatic edema. Spleen: No splenomegaly. No focal mass lesion. Adrenals/Urinary Tract: No adrenal nodule or mass. Kidneys unremarkable. 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 The urinary bladder appears normal for the degree of distention. Stomach/Bowel: Small to moderate hiatal hernia. Stomach otherwise unremarkable. Duodenum is normally positioned as is the ligament of Treitz. No small bowel wall thickening. No  small bowel dilatation. The terminal ileum is normal. The appendix is normal. No gross colonic mass. No colonic wall thickening. Diverticular changes are noted in the right colon without diverticulitis. Prominent stool volume evident. Vascular/Lymphatic: There is abdominal aortic atherosclerosis without aneurysm. There is no gastrohepatic or hepatoduodenal ligament lymphadenopathy. No intraperitoneal or retroperitoneal lymphadenopathy. Left common iliac artery measures up to 1.7 cm diameter. No pelvic sidewall lymphadenopathy. Reproductive: Prostate gland is enlarged. Other: No intraperitoneal free fluid. Musculoskeletal: No evidence for an acute fracture in the bony pelvis. No evidence for lumbar spine fracture. Degenerative disc disease noted at lower lumbar levels. IMPRESSION: 1. Acute fracture of the posterolateral right ninth rib and a segmental fracture of the posterior right tenth rib fifth which shows about 1 shaft with of inferior displacement of the segmental fragment on coronal images (see images 87 and 88 of series 6). 2. No pneumothorax but there is a tiny right pleural effusion. Atelectasis  noted in the posterior lower lobes bilaterally. 3. No other acute traumatic abnormality identified in the chest, abdomen, or pelvis. 4. Small to moderate hiatal hernia. 5. Prostatomegaly. 6.  Aortic Atherosclerois (ICD10-170.0) Electronically Signed   By: Misty Stanley M.D.   On: 05/15/2019 19:45    Pending Labs Unresulted Labs (From admission, onward)    Start     Ordered   05/15/19 2007  SARS Coronavirus 2 (CEPHEID - Performed in [facility] hospital lab), Hosp Order  (Asymptomatic Patients Labs)  Once,   STAT    Question:  Rule Out  Answer:  Yes   05/15/19 2006   Signed and Held  Basic metabolic panel  Tomorrow morning,   R     Signed and Held   Signed and Held  CBC  Tomorrow morning,   R     Signed and Held   Signed and Held  Protime-INR  Once,   R     Signed and Held   Signed and Held  APTT   Tomorrow morning,   R     Signed and Held          Vitals/Pain Today's Vitals   05/15/19 1830 05/15/19 2015 05/15/19 2030 05/15/19 2045  BP: 134/77 (!) 146/79 129/71 138/74  Pulse: 64 68 67 66  Resp: 14 (!) 23 19 (!) 24  Temp:      TempSrc:      SpO2: 97% 96% 96% 98%  Weight:      Height:      PainSc:        Isolation Precautions No active isolations  Medications Medications  oxyCODONE-acetaminophen (PERCOCET/ROXICET) 5-325 MG per tablet 1 tablet (has no administration in time range)  iohexol (OMNIPAQUE) 300 MG/ML solution 100 mL (100 mLs Intravenous Contrast Given 05/15/19 1900)    Mobility walks Low fall risk   Focused Assessments Skin Color/Condition   R Recommendations: See Admitting Provider Note  Report given to:   Additional Notes:

## 2019-05-15 NOTE — H&P (Signed)
History and Physical    Allen Robinson JWJ:[id number] DOB: 1926-07-14 DOA: 05/15/2019  Referring MD/NP/PA:   PCP: Marin Olp, MD   Patient coming from:  The patient is coming from home.  At baseline, pt is independent for most of ADL.        Chief Complaint: fall and right rib cage pain  HPI: Allen Robinson is a 85 y.o. male with medical history significant of hyperlipidemia, GERD, gout, arthritis, allergy, BPH, who presents with fall, rib cage pain.  Patient states that he fell when he was walking out of the door when it slammed closed on him 2 days ago, causing him to lose his balance and fall over. Pt landed on the arm of a chair on his right side; he reports bruising to right flank and right rib cage area. No LOC.  No unilateral numbness or tingling his extremities.  No facial droop or slurred speech.  Patient strongly denies head or neck injury. He developed severe pain in the right flank and right rib cage area, which is constant, 8 out of 10 in severity, sharp, nonradiating.  No shortness of breath.  Patient does not have fever or chills.  Patient denies nausea vomiting, diarrhea, abdominal pain, symptoms of UTI.  ED Course: pt was found to have WBC 8.2, pending COVID-19 test, negative urinalysis, electrolytes renal function okay, temperature normal, no tachycardia, blood pressure 134/77, oxygen saturation 95% on room air.  CT abdomen/pelvis is negative for acute intra-abdominal issues. CT-chest showed right 9th and 10th rib fracture.  Pt is placed on telemetry bed for observation.  CT surgeon, Dr. Roxan Hockey was consulted by EDP.  CT-chest and abd/plevis showed:  1. Acute fracture of the posterolateral right ninth rib and a segmental fracture of the posterior right tenth rib fifth which shows about 1 shaft with of inferior displacement of the segmental fragment on coronal images (see images 87 and 88 of series 6). 2. No pneumothorax but there is a tiny right pleural  effusion. Atelectasis noted in the posterior lower lobes bilaterally. 3. No other acute traumatic abnormality identified in the chest, abdomen, or pelvis. 4. Small to moderate hiatal hernia. 5. Prostatomegaly. 6. Aortic Atherosclerois (ICD10-170.0)  Review of Systems:   General: no fevers, chills, no body weight gain, has fatigue HEENT: no blurry vision, hearing changes or sore throat Respiratory: no dyspnea, coughing, wheezing CV: no chest pain, no palpitations. Has right rib cage and flank pain. GI: no nausea, vomiting, abdominal pain, diarrhea, constipation GU: no dysuria, burning on urination, increased urinary frequency, hematuria  Ext: no leg edema Neuro: no unilateral weakness, numbness, or tingling, no vision change or hearing loss. Has fall.  Skin: no rash. Has a large area of bruise over right rib cage and flank area. Has a small area of skin abrasion in right ankle area. MSK: No muscle spasm, no deformity, no limitation of range of movement in spin Heme: No easy bruising.  Travel history: No recent long distant travel.  Allergy: No Known Allergies  Past Medical History:  Diagnosis Date  . Arthritis    hands  . Chicken pox   . Hyperlipidemia   . Pneumonia   . Seasonal allergies     Past Surgical History:  Procedure Laterality Date  . TONSILLECTOMY     1936    Social History:  reports that he has never smoked. He has never used smokeless tobacco. He reports current alcohol use. He reports that he does not use drugs.  Family History:  Family History  Problem Relation Age of Onset  . Heart failure Mother   . Pneumonia Mother        died 70  . Stroke Father        5  . Neuropathy Neg Hx      Prior to Admission medications   Medication Sig Start Date End Date Taking? Authorizing Provider  allopurinol (ZYLOPRIM) 100 MG tablet Take 100 mg by mouth 2 (two) times daily.     [provider]  Cholecalciferol (VITAMIN D3) 250 MCG (10000 UT) TABS Take  10,000 Units by mouth daily.    [provider]  colchicine 0.6 MG tablet Take 0.6 mg by mouth as needed.    [provider]  finasteride (PROSCAR) 5 MG tablet Take 5 mg by mouth daily.    [provider]  omeprazole (PRILOSEC) 20 MG capsule Take 20 mg by mouth daily as needed.     [provider]  simvastatin (ZOCOR) 40 MG tablet Take 20 mg by mouth at bedtime.     [provider]  vitamin B-12 (CYANOCOBALAMIN) 500 MCG tablet Take 500 mcg by mouth daily.    [provider]    Physical Exam: Vitals:   05/15/19 2015 05/15/19 2030 05/15/19 2045 05/15/19 2131  BP: (!) 146/79 129/71 138/74 (!) 148/81  Pulse: 68 67 66 63  Resp: (!) 23 19 (!) 24 18  Temp:    98.8 F (37.1 C)  TempSrc:    Oral  SpO2: 96% 96% 98% 98%  Weight:      Height:       General: Not in acute distress HEENT:       Eyes: PERRL, EOMI, no scleral icterus.       ENT: No discharge from the ears and nose, no pharynx injection, no tonsillar enlargement.        Neck: No JVD, no bruit, no mass felt. Heme: No neck lymph node enlargement. Cardiac: S1/S2, RRR, No murmurs, No gallops or rubs. Respiratory:  No rales, wheezing, rhonchi or rubs. GI: Soft, nondistended, nontender, no rebound pain, no organomegaly, BS present. GU: No hematuria Ext: No pitting leg edema bilaterally. 2+DP/PT pulse bilaterally. Musculoskeletal: No joint deformities, No joint redness or warmth, no limitation of ROM in spin. Has right rib cage and flank pain. Skin: No rashes. Has a large area of bruise over right rib cage and flank area. Has a small area of skin abrasion in right ankle area. Neuro: Alert, oriented X3, cranial nerves II-XII grossly intact, moves all extremities normally.  Psych: Patient is not psychotic, no suicidal or hemocidal ideation.  Labs on Admission: I have personally reviewed following labs and imaging studies  CBC: Recent Labs  Lab 05/15/19 1657  WBC 8.2  NEUTROABS  5.8  HGB 12.4*  HCT 38.1*  MCV 94.3  PLT 578*   Basic Metabolic Panel: Recent Labs  Lab 05/15/19 1657  NA 140  K 3.5  CL 107  CO2 24  GLUCOSE 136*  BUN 22  CREATININE 1.11  CALCIUM 8.9   GFR: Estimated Creatinine Clearance: 44.8 mL/min (by C-G formula based on SCr of 1.11 mg/dL). Liver Function Tests: No results for input(s): AST, ALT, ALKPHOS, BILITOT, PROT, ALBUMIN in the last 168 hours. No results for input(s): LIPASE, AMYLASE in the last 168 hours. No results for input(s): AMMONIA in the last 168 hours. Coagulation Profile: No results for input(s): INR, PROTIME in the last 168 hours. Cardiac Enzymes: No results  for input(s): CKTOTAL, CKMB, CKMBINDEX, TROPONINI in the last 168 hours. BNP (last 3 results) No results for input(s): PROBNP in the last 8760 hours. HbA1C: No results for input(s): HGBA1C in the last 72 hours. CBG: No results for input(s): GLUCAP in the last 168 hours. Lipid Profile: No results for input(s): CHOL, HDL, LDLCALC, TRIG, CHOLHDL, LDLDIRECT in the last 72 hours. Thyroid Function Tests: No results for input(s): TSH, T4TOTAL, FREET4, T3FREE, THYROIDAB in the last 72 hours. Anemia Panel: No results for input(s): VITAMINB12, FOLATE, FERRITIN, TIBC, IRON, RETICCTPCT in the last 72 hours. Urine analysis:    Component Value Date/Time   COLORURINE YELLOW 05/15/2019 1815   APPEARANCEUR CLEAR 05/15/2019 1815   LABSPEC 1.023 05/15/2019 1815   PHURINE 5.0 05/15/2019 1815   GLUCOSEU NEGATIVE 05/15/2019 1815   HGBUR NEGATIVE 05/15/2019 1815   BILIRUBINUR NEGATIVE 05/15/2019 1815   BILIRUBINUR N 10/07/2018 1210   KETONESUR NEGATIVE 05/15/2019 1815   PROTEINUR NEGATIVE 05/15/2019 1815   UROBILINOGEN 0.2 10/07/2018 1210   NITRITE NEGATIVE 05/15/2019 1815   LEUKOCYTESUR NEGATIVE 05/15/2019 1815   Sepsis Labs: @LABRCNTIP (procalcitonin:4,lacticidven:4) ) Recent Results (from the past 240 hour(s))  SARS Coronavirus 2 (CEPHEID - Performed in [place]  hospital lab), Hosp Order     Status: None   Collection Time: 05/15/19  8:09 PM   Specimen: Nasopharyngeal Swab  Result Value Ref Range Status   SARS Coronavirus 2 NEGATIVE NEGATIVE Final    Comment: (NOTE) If result is NEGATIVE SARS-CoV-2 target nucleic acids are NOT DETECTED. The SARS-CoV-2 RNA is generally detectable in upper and lower  respiratory specimens during the acute phase of infection. The lowest  concentration of SARS-CoV-2 viral copies this assay can detect is 250  copies / mL. A negative result does not preclude SARS-CoV-2 infection  and should not be used as the sole basis for treatment or other  patient management decisions.  A negative result may occur with  improper specimen collection / handling, submission of specimen other  than nasopharyngeal swab, presence of viral mutation(s) within the  areas targeted by this assay, and inadequate number of viral copies  (<250 copies / mL). A negative result must be combined with clinical  observations, patient history, and epidemiological information. If result is POSITIVE SARS-CoV-2 target nucleic acids are DETECTED. The SARS-CoV-2 RNA is generally detectable in upper and lower  respiratory specimens dur ing the acute phase of infection.  Positive  results are indicative of active infection with SARS-CoV-2.  Clinical  correlation with patient history and other diagnostic information is  necessary to determine patient infection status.  Positive results do  not rule out bacterial infection or co-infection with other viruses. If result is PRESUMPTIVE POSTIVE SARS-CoV-2 nucleic acids MAY BE PRESENT.   A presumptive positive result was obtained on the submitted specimen  and confirmed on repeat testing.  While 2019 novel coronavirus  (SARS-CoV-2) nucleic acids may be present in the submitted sample  additional confirmatory testing may be necessary for epidemiological  and / or clinical management purposes  to differentiate  between  SARS-CoV-2 and other Sarbecovirus currently known to infect humans.  If clinically indicated additional testing with an alternate test  methodology [phone]) is advised. The SARS-CoV-2 RNA is generally  detectable in upper and lower respiratory sp ecimens during the acute  phase of infection. The expected result is Negative. Fact Sheet for Patients:  StrictlyIdeas.no Fact Sheet for Healthcare Providers: BankingDealers.co.za This test is not yet approved or cleared by the [place] FDA  and has been authorized for detection and/or diagnosis of SARS-CoV-2 by FDA under an Emergency Use Authorization (EUA).  This EUA will remain in effect (meaning this test can be used) for the duration of the COVID-19 declaration under Section 564(b)(1) of the Act, 21 U.S.C. section 360bbb-3(b)(1), unless the authorization is terminated or revoked sooner. Performed at [facility], [address], [place], [place] [address]      Radiological Exams on Admission: Ct Chest W Contrast  Result Date: 05/15/2019 CLINICAL DATA:  Status post fall from standing yesterday with right side pain. EXAM: CT CHEST, ABDOMEN, AND PELVIS WITH CONTRAST TECHNIQUE: Multidetector CT imaging of the chest, abdomen and pelvis was performed following the standard protocol during bolus administration of intravenous contrast. CONTRAST:  118mL OMNIPAQUE IOHEXOL 300 MG/ML  SOLN COMPARISON:  No comparison studies available. FINDINGS: CT CHEST FINDINGS Cardiovascular: The heart size is normal. No substantial pericardial effusion. Coronary artery calcification is evident. Atherosclerotic calcification is noted in the wall of the thoracic aorta. Mediastinum/Nodes: No mediastinal lymphadenopathy. There is no hilar lymphadenopathy. The esophagus has normal imaging features. Small to moderate hiatal hernia noted. There is no axillary lymphadenopathy. Lungs/Pleura: No evidence for  pneumothorax. Tiny right pleural effusion noted. There is atelectasis in the posterior lung bases bilaterally. No suspicious pulmonary nodule or mass. Musculoskeletal: Acute fractures identified posterolateral right ninth rib and there is a segmental fracture of the posterior right tenth rib. No evidence for thoracic spine fracture. No left rib fracture evident. CT ABDOMEN PELVIS FINDINGS Hepatobiliary: 7 mm hypoattenuating lesion in the medial segment left liver is too small to characterize but likely benign. Liver otherwise unremarkable. There is no evidence for gallstones, gallbladder wall thickening, or pericholecystic fluid. No intrahepatic or extrahepatic biliary dilation. Pancreas: No focal mass lesion. No dilatation of the main duct. No intraparenchymal cyst. No peripancreatic edema. Spleen: No splenomegaly. No focal mass lesion. Adrenals/Urinary Tract: No adrenal nodule or mass. Kidneys unremarkable. 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 The urinary bladder appears normal for the degree of distention. Stomach/Bowel: Small to moderate hiatal hernia. Stomach otherwise unremarkable. Duodenum is normally positioned as is the ligament of Treitz. No small bowel wall thickening. No small bowel dilatation. The terminal ileum is normal. The appendix is normal. No gross colonic mass. No colonic wall thickening. Diverticular changes are noted in the right colon without diverticulitis. Prominent stool volume evident. Vascular/Lymphatic: There is abdominal aortic atherosclerosis without aneurysm. There is no gastrohepatic or hepatoduodenal ligament lymphadenopathy. No intraperitoneal or retroperitoneal lymphadenopathy. Left common iliac artery measures up to 1.7 cm diameter. No pelvic sidewall lymphadenopathy. Reproductive: Prostate gland is enlarged. Other: No intraperitoneal free fluid. Musculoskeletal: No evidence for an acute fracture  in the bony pelvis. No evidence for lumbar spine fracture. Degenerative disc disease noted at lower lumbar levels. IMPRESSION: 1. Acute fracture of the posterolateral right ninth rib and a segmental fracture of the posterior right tenth rib fifth which shows about 1 shaft with of inferior displacement of the segmental fragment on coronal images (see images 87 and 88 of series 6). 2. No pneumothorax but there is a tiny right pleural effusion. Atelectasis noted in the posterior lower lobes bilaterally. 3. No other acute traumatic abnormality identified in the chest, abdomen, or pelvis. 4. Small to moderate hiatal hernia. 5. Prostatomegaly. 6.  Aortic Atherosclerois (ICD10-170.0) Electronically Signed   By: Misty Stanley M.D.   On: 05/15/2019 19:45   Ct Abdomen Pelvis W  Contrast  Result Date: 05/15/2019 CLINICAL DATA:  Status post fall from standing yesterday with right side pain. EXAM: CT CHEST, ABDOMEN, AND PELVIS WITH CONTRAST TECHNIQUE: Multidetector CT imaging of the chest, abdomen and pelvis was performed following the standard protocol during bolus administration of intravenous contrast. CONTRAST:  144mL OMNIPAQUE IOHEXOL 300 MG/ML  SOLN COMPARISON:  No comparison studies available. FINDINGS: CT CHEST FINDINGS Cardiovascular: The heart size is normal. No substantial pericardial effusion. Coronary artery calcification is evident. Atherosclerotic calcification is noted in the wall of the thoracic aorta. Mediastinum/Nodes: No mediastinal lymphadenopathy. There is no hilar lymphadenopathy. The esophagus has normal imaging features. Small to moderate hiatal hernia noted. There is no axillary lymphadenopathy. Lungs/Pleura: No evidence for pneumothorax. Tiny right pleural effusion noted. There is atelectasis in the posterior lung bases bilaterally. No suspicious pulmonary nodule or mass. Musculoskeletal: Acute fractures identified posterolateral right ninth rib and there is a segmental fracture of the posterior  right tenth rib. No evidence for thoracic spine fracture. No left rib fracture evident. CT ABDOMEN PELVIS FINDINGS Hepatobiliary: 7 mm hypoattenuating lesion in the medial segment left liver is too small to characterize but likely benign. Liver otherwise unremarkable. There is no evidence for gallstones, gallbladder wall thickening, or pericholecystic fluid. No intrahepatic or extrahepatic biliary dilation. Pancreas: No focal mass lesion. No dilatation of the main duct. No intraparenchymal cyst. No peripancreatic edema. Spleen: No splenomegaly. No focal mass lesion. Adrenals/Urinary Tract: No adrenal nodule or mass. Kidneys unremarkable. 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 The urinary bladder appears normal for the degree of distention. Stomach/Bowel: Small to moderate hiatal hernia. Stomach otherwise unremarkable. Duodenum is normally positioned as is the ligament of Treitz. No small bowel wall thickening. No small bowel dilatation. The terminal ileum is normal. The appendix is normal. No gross colonic mass. No colonic wall thickening. Diverticular changes are noted in the right colon without diverticulitis. Prominent stool volume evident. Vascular/Lymphatic: There is abdominal aortic atherosclerosis without aneurysm. There is no gastrohepatic or hepatoduodenal ligament lymphadenopathy. No intraperitoneal or retroperitoneal lymphadenopathy. Left common iliac artery measures up to 1.7 cm diameter. No pelvic sidewall lymphadenopathy. Reproductive: Prostate gland is enlarged. Other: No intraperitoneal free fluid. Musculoskeletal: No evidence for an acute fracture in the bony pelvis. No evidence for lumbar spine fracture. Degenerative disc disease noted at lower lumbar levels. IMPRESSION: 1. Acute fracture of the posterolateral right ninth rib and a segmental fracture of the posterior right tenth rib fifth which shows about 1 shaft  with of inferior displacement of the segmental fragment on coronal images (see images 87 and 88 of series 6). 2. No pneumothorax but there is a tiny right pleural effusion. Atelectasis noted in the posterior lower lobes bilaterally. 3. No other acute traumatic abnormality identified in the chest, abdomen, or pelvis. 4. Small to moderate hiatal hernia. 5. Prostatomegaly. 6.  Aortic Atherosclerois (ICD10-170.0) Electronically Signed   By: Misty Stanley M.D.   On: 05/15/2019 19:45     EKG: Independently reviewed.  Sinus rhythm, QTC 423, low voltage, LAD, nonspecific T wave change.  Assessment/Plan Principal Problem:   Fall at home, initial encounter Active Problems:   Gout   BPH associated with nocturia   Hyperlipidemia   GERD (gastroesophageal reflux disease)   Right rib fracture   Fall at home, initial encounter and right rib fracture: Patient seems to have had a mechanical fall.  Both patient and his daughter strongly denied any head or neck injury.  Refused CT head and CT neck. CT showed acute fracture of the posterolateral right ninth rib and a segmental fracture of the posterior right tenth rib fifth which shows about 1 shaft with of inferior displacement of the segmental fragment; no pneumothorax.  ED physician discussed with CT surgeon, Dr. Roxan Hockey, who dose not think patient need any procedure.  -will place in tele bed for obs -Pain control: PRN Percocet and Tylenol -Incentive spirometry  Gout: -continue home allopurinol and Colchicine  BPH associated with nocturia: -Proscar  Hyperlipidemia: -Zocor  GERD (gastroesophageal reflux disease): -Protonix    DVT ppx: SCD Code Status: Full code Family Communication: None at bed side.  Disposition Plan:  Anticipate discharge back to previous home environment Consults called:  CT surgeon, Dr. Roxan Hockey Admission status: Obs / tele  Date of Service 05/15/2019    [facility]   If 7PM-7AM, please  contact night-coverage [email or web address] Password [email or web address] 05/15/2019, 10:20 PM

## 2019-05-15 NOTE — ED Triage Notes (Signed)
Pt here after fall yesterday where he lost his balance and fell on his right side and sts he has broken some ribs. Denies shortness of breath.

## 2019-05-16 DIAGNOSIS — Y92009 Unspecified place in unspecified non-institutional (private) residence as the place of occurrence of the external cause: Secondary | ICD-10-CM | POA: Diagnosis not present

## 2019-05-16 DIAGNOSIS — W19XXXA Unspecified fall, initial encounter: Secondary | ICD-10-CM | POA: Diagnosis not present

## 2019-05-16 DIAGNOSIS — S2241XA Multiple fractures of ribs, right side, initial encounter for closed fracture: Secondary | ICD-10-CM | POA: Diagnosis not present

## 2019-05-16 DIAGNOSIS — N401 Enlarged prostate with lower urinary tract symptoms: Secondary | ICD-10-CM | POA: Diagnosis not present

## 2019-05-16 LAB — BASIC METABOLIC PANEL
Anion gap: 10 (ref 5–15)
BUN: 19 mg/dL (ref 8–23)
CO2: 24 mmol/L (ref 22–32)
Calcium: 8.8 mg/dL — ABNORMAL LOW (ref 8.9–10.3)
Chloride: 105 mmol/L (ref 98–111)
Creatinine, Ser: 1.02 mg/dL (ref 0.61–1.24)
GFR calc Af Amer: >60 mL/min (ref >60)
GFR calc non Af Amer: >60 mL/min (ref >60)
Glucose, Bld: 100 mg/dL — ABNORMAL HIGH (ref 70–99)
Potassium: 3.8 mmol/L (ref 3.5–5.1)
Sodium: 139 mmol/L (ref 135–145)

## 2019-05-16 LAB — CBC
HCT: 37.5 % — ABNORMAL LOW (ref 39.0–52.0)
Hemoglobin: 12.4 g/dL — ABNORMAL LOW (ref 13.0–17.0)
MCH: 30.6 pg (ref 26.0–34.0)
MCHC: 33.1 g/dL (ref 30.0–36.0)
MCV: 92.6 fL (ref 80.0–100.0)
Platelets: 135 10*3/uL — ABNORMAL LOW (ref 150–400)
RBC: 4.05 MIL/uL — ABNORMAL LOW (ref 4.22–5.81)
RDW: 13.2 % (ref 11.5–15.5)
WBC: 7 10*3/uL (ref 4.0–10.5)
nRBC: 0 % (ref 0.0–0.2)

## 2019-05-16 LAB — GLUCOSE, CAPILLARY: Glucose-Capillary: 83 mg/dL (ref 70–99)

## 2019-05-16 LAB — APTT: aPTT: 30 seconds (ref 24–36)

## 2019-05-16 MED ORDER — ACETAMINOPHEN 500 MG PO TABS: 500.0000 mg | ORAL_TABLET | Freq: Four times a day (QID) | ORAL | Status: DC | PRN

## 2019-05-16 NOTE — Progress Notes (Signed)
Allen Robinson to be D/C'd  per MD order. Discussed with the patient and all questions fully answered.  VSS, Skin clean, dry and intact without evidence of skin break down, no evidence of skin tears noted.  IV catheter discontinued intact. Site without signs and symptoms of complications. Dressing and pressure applied.  An After Visit Summary was printed and given to the patient. Patient received prescription.  D/c education completed with patient/family including follow up instructions, medication list, d/c activities limitations if indicated, with other d/c instructions as indicated by MD - patient able to verbalize understanding, all questions fully answered.   Patient instructed to return to ED, call 911, or call MD for any changes in condition.   Patient to be escorted via [place], and D/C home via private auto.

## 2019-05-16 NOTE — TOC Transition Note (Addendum)
Transition of Care [facility]) - CM/SW Discharge Note   Patient Details  Name: Allen Robinson MRN: [id number] Date of Birth: 04/15/1926  [facility] CM/SW Contact:  Marilu Favre, RN Phone Number: 05/16/2019, 11:50 AM   Clinical Narrative:   Patient and daughter decided on [facility] , referral accepted by Levonne Spiller of [facility].   Spoke to patient and daughter at bedside. Patient needs walker same ordered through [employer], [place] aware.  Provided medicare.gov list of home health agencies. Patient and daughter reviewing and will call a friend who has had home health , prior to deciding on agency. Will continue to follow.  Final next level of care: [place] Barriers to Discharge: No Barriers Identified   Patient Goals and CMS Choice Patient states their goals for this hospitalization and ongoing recovery are:: to go home CMS Medicare.gov Compare Post Acute Care list provided to:: Patient Choice offered to / list presented to : Patient, Adult Children  Discharge Placement                       Discharge Plan and Services   Discharge Planning Services: CM Consult Post Acute Care Choice: Durable Medical Equipment, Home Health          DME Arranged: Walker rolling DME Agency: [employer] Date DME Agency Contacted: 05/16/19 Time DME Agency Contacted: [id number] Representative spoke with at DME Agency: [place]: PT          Social Determinants of Health ([place]) Interventions     Readmission Risk Interventions No flowsheet data found.

## 2019-05-16 NOTE — Plan of Care (Signed)

## 2019-05-16 NOTE — Evaluation (Signed)
Physical Therapy Evaluation Patient Details Name: Allen Robinson MRN: [id number] DOB: Sep 21, 1926 Today's Date: 05/16/2019   History of Present Illness  Pt is a 85 y/o male [place]. Found to have R 9-10 rib fxs. PMH includes gout, arthritis, COPD, and lupus.   Clinical Impression  Pt admitted secondary to problem above with deficits below. Pt unsteady without AD, requiring min A for steadying assist. Much improved steadiness with use of RW. Educated about use of RW at home to increase safety. Educated about use of incentive spirometry as well. Will continue to follow acutely to maximize functional mobility independence and safety.     Follow Up Recommendations Home health PT    Equipment Recommendations  Rolling walker with 5" wheels    Recommendations for Other Services       Precautions / Restrictions Precautions Precautions: Fall Restrictions Weight Bearing Restrictions: No      Mobility  Bed Mobility Overal bed mobility: Needs Assistance Bed Mobility: Supine to Sit     Supine to sit: Supervision     General bed mobility comments: Supervision for safety. Increased time required.   Transfers Overall transfer level: Needs assistance Equipment used: None Transfers: Sit to/from Stand Sit to Stand: Min guard         General transfer comment: Min guard for steadying assist to stand without AD.   Ambulation/Gait Ambulation/Gait assistance: Min assist;Min guard Gait Distance (Feet): 150 Feet Assistive device: None;Rolling walker (2 wheeled) Gait Pattern/deviations: Step-through pattern;Decreased stride length Gait velocity: Decreased    General Gait Details: Pt with unsteady gait without AD. Pt with much improved steadiness with use of RW. Pt requiring min guard A for safety with use of RW. Educated about using RW at home to increase safety.   Stairs            Wheelchair Mobility    Modified Rankin (Stroke Patients  Only)       Balance Overall balance assessment: Needs assistance Sitting-balance support: No upper extremity supported;Feet supported Sitting balance-Leahy Scale: Good     Standing balance support: Bilateral upper extremity supported;During functional activity;No upper extremity supported Standing balance-Leahy Scale: Fair Standing balance comment: Able to maintain static standing without UE support                              Pertinent Vitals/Pain Pain Assessment: 0-10 Pain Score: 6  Pain Location: R ribs Pain Descriptors / Indicators: Aching;Grimacing;Guarding Pain Intervention(s): Monitored during session;Limited activity within patient's tolerance;Repositioned    Home Living Family/patient expects to be discharged to:: Private residence Living Arrangements: Spouse/significant other Available Help at Discharge: Family;Available 24 hours/day Type of Home: House Home Access: Stairs to enter Entrance Stairs-Rails: None Entrance Stairs-Number of Steps: 1 Home Layout: Two level;Able to live on main level with bedroom/bathroom Home Equipment: None      Prior Function Level of Independence: Independent               Hand Dominance        Extremity/Trunk Assessment   Upper Extremity Assessment Upper Extremity Assessment: Generalized weakness    Lower Extremity Assessment Lower Extremity Assessment: Generalized weakness    Cervical / Trunk Assessment Cervical / Trunk Assessment: Kyphotic  Communication   Communication: HOH  Cognition Arousal/Alertness: Awake/alert Behavior During Therapy: WFL for tasks assessed/performed Overall Cognitive Status: Within Functional Limits for tasks assessed  General Comments General comments (skin integrity, edema, etc.): Pt's daughter present during session. Educated about using pillow to brace ribs when coughing, sneezing, etc.     Exercises Other  Exercises Other Exercises: Pt practicing incentive spirometry X10 breaths. Educated about frequency, etc.    Assessment/Plan    PT Assessment Patient needs continued PT services  PT Problem List Decreased strength;Decreased balance;Decreased mobility;Decreased knowledge of precautions;Pain       PT Treatment Interventions DME instruction;Gait training;Functional mobility training;Therapeutic activities;Stair training;Therapeutic exercise;Balance training;Patient/family education    PT Goals (Current goals can be found in the Care Plan section)  Acute Rehab PT Goals Patient Stated Goal: to go home today PT Goal Formulation: With patient Time For Goal Achievement: 05/30/19 Potential to Achieve Goals: Good    Frequency Min 3X/week   Barriers to discharge        Co-evaluation               AM-PAC PT "6 Clicks" Mobility  Outcome Measure Help needed turning from your back to your side while in a flat bed without using bedrails?: None Help needed moving from lying on your back to sitting on the side of a flat bed without using bedrails?: A Little Help needed moving to and from a bed to a chair (including a wheelchair)?: A Little Help needed standing up from a chair using your arms (e.g., wheelchair or bedside chair)?: A Little Help needed to walk in hospital room?: A Little Help needed climbing 3-5 steps with a railing? : A Little 6 Click Score: 19    End of Session   Activity Tolerance: Patient tolerated treatment well Patient left: in chair;with call bell/phone within reach;with chair alarm set;with family/visitor present Nurse Communication: Mobility status PT Visit Diagnosis: Unsteadiness on feet (R26.81);Pain Pain - part of body: (ribs)    Time: 9373-4287 PT Time Calculation (min) (ACUTE ONLY): 20 min   Charges:   PT Evaluation $PT Eval Low Complexity: Rheems, PT, DPT  Acute Rehabilitation Services  Pager: [phone] Office: [phone]   Rudean Hitt 05/16/2019, 12:18 PM

## 2019-05-16 NOTE — Care Management Obs Status (Signed)
[facility] NOTIFICATION   Patient Details  Name: Moua Rasmusson MRN: [id number] Date of Birth: 01-23-1926   Medicare Observation Status Notification Given:  Yes    Marilu Favre, RN 05/16/2019, 11:48 AM

## 2019-05-18 NOTE — Discharge Summary (Signed)
Physician Discharge Summary  Allen Robinson WRU:[id number] DOB: [birth year] DOA: 05/15/2019  PCP: Marin Olp, MD  Admit date: 05/15/2019 Discharge date: 05/16/2019  Time spent: 26minutes  Recommendations for Outpatient Follow-up:  PCP Dr. Yong Channel in 1 week  Discharge Diagnoses:  Principal Problem:   Fall at home, initial encounter Rib fractures of the ninth and 10th right lateral   Gout   BPH associated with nocturia   Hyperlipidemia   GERD (gastroesophageal reflux disease)   Right rib fracture   Discharge Condition: Stable  Diet recommendation: Regular  Filed Weights   05/15/19 1626  Weight: 76.2 kg    History of present illness:  Allen Robinson is a 85 y.o. male with medical history significant of hyperlipidemia, GERD, gout, arthritis, allergy, BPH, who presented with fall, rib cage pain.   Hospital Course:   Fall, rib fractures right lateral ninth and 10th rib -Admitted following a mechanical fall -CT chest noted acute fracture of posterior lateral right ninth and segmental fracture of posterior right 10th rib, no pneumothorax was noted, mild atelectasis noted  -Case was discussed with CT surgery and Dr. Roxan Hockey who did not think any procedures were indicated -Patient was admitted overnight for observation he was treated with Tylenol and incentive spirometer used and encouraged every hour -Clinically felt to be stable he has a large bruise in his right posterior lateral chest wall -Discharged home in a stable condition, advised about warning symptoms of worsening shortness of breath fever worsening productive cough which would need to be evaluated   All his other chronic medical problems were stable he is a relatively healthy and functional 85 year old male   Discharge Exam: Vitals:   05/15/19 2131 05/16/19 0417  BP: (!) 148/81 140/81  Pulse: 63 65  Resp: 18 17  Temp: 98.8 F (37.1 C) 98.5 F (36.9 C)  SpO2: 98% 98%    General:  AAOx3 Cardiovascular: S1S2/RRR Respiratory: Decreased breath sounds at the right base  Discharge Instructions   Discharge Instructions    Diet - low sodium heart healthy   Complete by: As directed    Increase activity slowly   Complete by: As directed      Allergies as of 05/16/2019   No Known Allergies     Medication List    TAKE these medications   acetaminophen 500 MG tablet Commonly known as: TYLENOL Take 1 tablet (500 mg total) by mouth every 6 (six) hours as needed for mild pain or moderate pain (or Fever >/= 101).   allopurinol 100 MG tablet Commonly known as: ZYLOPRIM Take 100 mg by mouth 2 (two) times daily.   colchicine 0.6 MG tablet Take 0.6 mg by mouth as needed.   finasteride 5 MG tablet Commonly known as: PROSCAR Take 5 mg by mouth daily.   omeprazole 20 MG capsule Commonly known as: PRILOSEC Take 20 mg by mouth daily as needed.   simvastatin 40 MG tablet Commonly known as: ZOCOR Take 20 mg by mouth at bedtime.   vitamin B-12 500 MCG tablet Commonly known as: CYANOCOBALAMIN Take 500 mcg by mouth daily.   Vitamin D3 250 MCG (10000 UT) Tabs Take 10,000 Units by mouth daily.      No Known Allergies Follow-up Information    Marin Olp, MD. Schedule an appointment as soon as possible for a visit in 1 week(s).   Specialty: Family Medicine Contact information: [address] [place] [address] [facility] up.            The results of significant diagnostics from this hospitalization (including imaging, microbiology, ancillary and laboratory) are listed below for reference.    Significant Diagnostic Studies: Ct Chest W Contrast  Result Date: 05/15/2019 CLINICAL DATA:  Status post fall from standing yesterday with right side pain. EXAM: CT CHEST, ABDOMEN, AND PELVIS WITH CONTRAST TECHNIQUE: Multidetector CT imaging of the chest, abdomen and pelvis was performed following the  standard protocol during bolus administration of intravenous contrast. CONTRAST:  163mL OMNIPAQUE IOHEXOL 300 MG/ML  SOLN COMPARISON:  No comparison studies available. FINDINGS: CT CHEST FINDINGS Cardiovascular: The heart size is normal. No substantial pericardial effusion. Coronary artery calcification is evident. Atherosclerotic calcification is noted in the wall of the thoracic aorta. Mediastinum/Nodes: No mediastinal lymphadenopathy. There is no hilar lymphadenopathy. The esophagus has normal imaging features. Small to moderate hiatal hernia noted. There is no axillary lymphadenopathy. Lungs/Pleura: No evidence for pneumothorax. Tiny right pleural effusion noted. There is atelectasis in the posterior lung bases bilaterally. No suspicious pulmonary nodule or mass. Musculoskeletal: Acute fractures identified posterolateral right ninth rib and there is a segmental fracture of the posterior right tenth rib. No evidence for thoracic spine fracture. No left rib fracture evident. CT ABDOMEN PELVIS FINDINGS Hepatobiliary: 7 mm hypoattenuating lesion in the medial segment left liver is too small to characterize but likely benign. Liver otherwise unremarkable. There is no evidence for gallstones, gallbladder wall thickening, or pericholecystic fluid. No intrahepatic or extrahepatic biliary dilation. Pancreas: No focal mass lesion. No dilatation of the main duct. No intraparenchymal cyst. No peripancreatic edema. Spleen: No splenomegaly. No focal mass lesion. Adrenals/Urinary Tract: No adrenal nodule or mass. Kidneys unremarkable. 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 The urinary bladder appears normal for the degree of distention. Stomach/Bowel: Small to moderate hiatal hernia. Stomach otherwise unremarkable. Duodenum is normally positioned as is the ligament of Treitz. No small bowel wall thickening. No small bowel dilatation. The terminal  ileum is normal. The appendix is normal. No gross colonic mass. No colonic wall thickening. Diverticular changes are noted in the right colon without diverticulitis. Prominent stool volume evident. Vascular/Lymphatic: There is abdominal aortic atherosclerosis without aneurysm. There is no gastrohepatic or hepatoduodenal ligament lymphadenopathy. No intraperitoneal or retroperitoneal lymphadenopathy. Left common iliac artery measures up to 1.7 cm diameter. No pelvic sidewall lymphadenopathy. Reproductive: Prostate gland is enlarged. Other: No intraperitoneal free fluid. Musculoskeletal: No evidence for an acute fracture in the bony pelvis. No evidence for lumbar spine fracture. Degenerative disc disease noted at lower lumbar levels. IMPRESSION: 1. Acute fracture of the posterolateral right ninth rib and a segmental fracture of the posterior right tenth rib fifth which shows about 1 shaft with of inferior displacement of the segmental fragment on coronal images (see images 87 and 88 of series 6). 2. No pneumothorax but there is a tiny right pleural effusion. Atelectasis noted in the posterior lower lobes bilaterally. 3. No other acute traumatic abnormality identified in the chest, abdomen, or pelvis. 4. Small to moderate hiatal hernia. 5. Prostatomegaly. 6.  Aortic Atherosclerois (ICD10-170.0) Electronically Signed   By: Misty Stanley M.D.   On: 05/15/2019 19:45   Ct Abdomen Pelvis W Contrast  Result Date: 05/15/2019 CLINICAL DATA:  Status post fall from standing yesterday with right side pain. EXAM: CT CHEST, ABDOMEN, AND PELVIS WITH CONTRAST TECHNIQUE: Multidetector CT imaging of the chest, abdomen and pelvis was performed following  the standard protocol during bolus administration of intravenous contrast. CONTRAST:  161mL OMNIPAQUE IOHEXOL 300 MG/ML  SOLN COMPARISON:  No comparison studies available. FINDINGS: CT CHEST FINDINGS Cardiovascular: The heart size is normal. No substantial pericardial effusion.  Coronary artery calcification is evident. Atherosclerotic calcification is noted in the wall of the thoracic aorta. Mediastinum/Nodes: No mediastinal lymphadenopathy. There is no hilar lymphadenopathy. The esophagus has normal imaging features. Small to moderate hiatal hernia noted. There is no axillary lymphadenopathy. Lungs/Pleura: No evidence for pneumothorax. Tiny right pleural effusion noted. There is atelectasis in the posterior lung bases bilaterally. No suspicious pulmonary nodule or mass. Musculoskeletal: Acute fractures identified posterolateral right ninth rib and there is a segmental fracture of the posterior right tenth rib. No evidence for thoracic spine fracture. No left rib fracture evident. CT ABDOMEN PELVIS FINDINGS Hepatobiliary: 7 mm hypoattenuating lesion in the medial segment left liver is too small to characterize but likely benign. Liver otherwise unremarkable. There is no evidence for gallstones, gallbladder wall thickening, or pericholecystic fluid. No intrahepatic or extrahepatic biliary dilation. Pancreas: No focal mass lesion. No dilatation of the main duct. No intraparenchymal cyst. No peripancreatic edema. Spleen: No splenomegaly. No focal mass lesion. Adrenals/Urinary Tract: No adrenal nodule or mass. Kidneys unremarkable. 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 The urinary bladder appears normal for the degree of distention. Stomach/Bowel: Small to moderate hiatal hernia. Stomach otherwise unremarkable. Duodenum is normally positioned as is the ligament of Treitz. No small bowel wall thickening. No small bowel dilatation. The terminal ileum is normal. The appendix is normal. No gross colonic mass. No colonic wall thickening. Diverticular changes are noted in the right colon without diverticulitis. Prominent stool volume evident. Vascular/Lymphatic: There is abdominal aortic atherosclerosis without  aneurysm. There is no gastrohepatic or hepatoduodenal ligament lymphadenopathy. No intraperitoneal or retroperitoneal lymphadenopathy. Left common iliac artery measures up to 1.7 cm diameter. No pelvic sidewall lymphadenopathy. Reproductive: Prostate gland is enlarged. Other: No intraperitoneal free fluid. Musculoskeletal: No evidence for an acute fracture in the bony pelvis. No evidence for lumbar spine fracture. Degenerative disc disease noted at lower lumbar levels. IMPRESSION: 1. Acute fracture of the posterolateral right ninth rib and a segmental fracture of the posterior right tenth rib fifth which shows about 1 shaft with of inferior displacement of the segmental fragment on coronal images (see images 87 and 88 of series 6). 2. No pneumothorax but there is a tiny right pleural effusion. Atelectasis noted in the posterior lower lobes bilaterally. 3. No other acute traumatic abnormality identified in the chest, abdomen, or pelvis. 4. Small to moderate hiatal hernia. 5. Prostatomegaly. 6.  Aortic Atherosclerois (ICD10-170.0) Electronically Signed   By: Misty Stanley M.D.   On: 05/15/2019 19:45    Microbiology: Recent Results (from the past 240 hour(s))  SARS Coronavirus 2 (CEPHEID - Performed in [facility] hospital lab), Hosp Order     Status: None   Collection Time: 05/15/19  8:09 PM   Specimen: Nasopharyngeal Swab  Result Value Ref Range Status   SARS Coronavirus 2 NEGATIVE NEGATIVE Final    Comment: (NOTE) If result is NEGATIVE SARS-CoV-2 target nucleic acids are NOT DETECTED. The SARS-CoV-2 RNA is generally detectable in upper and lower  respiratory specimens during the acute phase of infection. The lowest  concentration of SARS-CoV-2 viral copies this assay can detect is 250  copies / mL. A negative result does not preclude SARS-CoV-2 infection  and should not be used as  the sole basis for treatment or other  patient management decisions.  A negative result may occur with  improper  specimen collection / handling, submission of specimen other  than nasopharyngeal swab, presence of viral mutation(s) within the  areas targeted by this assay, and inadequate number of viral copies  (<250 copies / mL). A negative result must be combined with clinical  observations, patient history, and epidemiological information. If result is POSITIVE SARS-CoV-2 target nucleic acids are DETECTED. The SARS-CoV-2 RNA is generally detectable in upper and lower  respiratory specimens dur ing the acute phase of infection.  Positive  results are indicative of active infection with SARS-CoV-2.  Clinical  correlation with patient history and other diagnostic information is  necessary to determine patient infection status.  Positive results do  not rule out bacterial infection or co-infection with other viruses. If result is PRESUMPTIVE POSTIVE SARS-CoV-2 nucleic acids MAY BE PRESENT.   A presumptive positive result was obtained on the submitted specimen  and confirmed on repeat testing.  While 2019 novel coronavirus  (SARS-CoV-2) nucleic acids may be present in the submitted sample  additional confirmatory testing may be necessary for epidemiological  and / or clinical management purposes  to differentiate between  SARS-CoV-2 and other Sarbecovirus currently known to infect humans.  If clinically indicated additional testing with an alternate test  methodology [phone]) is advised. The SARS-CoV-2 RNA is generally  detectable in upper and lower respiratory sp ecimens during the acute  phase of infection. The expected result is Negative. Fact Sheet for Patients:  StrictlyIdeas.no Fact Sheet for Healthcare Providers: BankingDealers.co.za This test is not yet approved or cleared by the [place] FDA and has been authorized for detection and/or diagnosis of SARS-CoV-2 by FDA under an Emergency Use Authorization (EUA).  This EUA will remain in  effect (meaning this test can be used) for the duration of the COVID-19 declaration under Section 564(b)(1) of the Act, 21 U.S.C. section 360bbb-3(b)(1), unless the authorization is terminated or revoked sooner. Performed at [facility], [place] [address], [place], [place] [address]      Labs: Basic Metabolic Panel: Recent Labs  Lab 05/15/19 1657 05/16/19 0652  NA 140 139  K 3.5 3.8  CL 107 105  CO2 24 24  GLUCOSE 136* 100*  BUN 22 19  CREATININE 1.11 1.02  CALCIUM 8.9 8.8*   Liver Function Tests: No results for input(s): AST, ALT, ALKPHOS, BILITOT, PROT, ALBUMIN in the last 168 hours. No results for input(s): LIPASE, AMYLASE in the last 168 hours. No results for input(s): AMMONIA in the last 168 hours. CBC: Recent Labs  Lab 05/15/19 1657 05/16/19 0652  WBC 8.2 7.0  NEUTROABS 5.8  --   HGB 12.4* 12.4*  HCT 38.1* 37.5*  MCV 94.3 92.6  PLT 139* 135*   Cardiac Enzymes: No results for input(s): CKTOTAL, CKMB, CKMBINDEX, TROPONINI in the last 168 hours. BNP: BNP (last 3 results) No results for input(s): BNP in the last 8760 hours.  ProBNP (last 3 results) No results for input(s): PROBNP in the last 8760 hours.  CBG: Recent Labs  Lab 05/16/19 0814  GLUCAP 83       Signed:  Domenic Polite MD.  [facility] 05/18/2019, 2:37 PM

## 2019-05-19 ENCOUNTER — Encounter: Payer: Self-pay | Admitting: Family Medicine

## 2019-05-19 DIAGNOSIS — N401 Enlarged prostate with lower urinary tract symptoms: Secondary | ICD-10-CM | POA: Diagnosis not present

## 2019-05-19 DIAGNOSIS — G629 Polyneuropathy, unspecified: Secondary | ICD-10-CM | POA: Diagnosis not present

## 2019-05-19 DIAGNOSIS — M19041 Primary osteoarthritis, right hand: Secondary | ICD-10-CM | POA: Diagnosis not present

## 2019-05-19 DIAGNOSIS — E785 Hyperlipidemia, unspecified: Secondary | ICD-10-CM | POA: Diagnosis not present

## 2019-05-19 DIAGNOSIS — R351 Nocturia: Secondary | ICD-10-CM | POA: Diagnosis not present

## 2019-05-19 DIAGNOSIS — K219 Gastro-esophageal reflux disease without esophagitis: Secondary | ICD-10-CM | POA: Diagnosis not present

## 2019-05-19 DIAGNOSIS — W19XXXD Unspecified fall, subsequent encounter: Secondary | ICD-10-CM | POA: Diagnosis not present

## 2019-05-19 DIAGNOSIS — J90 Pleural effusion, not elsewhere classified: Secondary | ICD-10-CM | POA: Diagnosis not present

## 2019-05-19 DIAGNOSIS — M19042 Primary osteoarthritis, left hand: Secondary | ICD-10-CM | POA: Diagnosis not present

## 2019-05-19 DIAGNOSIS — Z9181 History of falling: Secondary | ICD-10-CM | POA: Diagnosis not present

## 2019-05-19 DIAGNOSIS — S2241XD Multiple fractures of ribs, right side, subsequent encounter for fracture with routine healing: Secondary | ICD-10-CM | POA: Diagnosis not present

## 2019-05-19 DIAGNOSIS — M109 Gout, unspecified: Secondary | ICD-10-CM | POA: Diagnosis not present

## 2019-05-19 DIAGNOSIS — I700 Atherosclerosis of aorta: Secondary | ICD-10-CM | POA: Diagnosis not present

## 2019-05-19 DIAGNOSIS — E559 Vitamin D deficiency, unspecified: Secondary | ICD-10-CM | POA: Diagnosis not present

## 2019-05-20 ENCOUNTER — Telehealth: Payer: Self-pay

## 2019-05-20 ENCOUNTER — Telehealth: Payer: Self-pay | Admitting: Family Medicine

## 2019-05-20 NOTE — Telephone Encounter (Signed)
Noted thanks °

## 2019-05-20 NOTE — Telephone Encounter (Signed)
Yes thanks 

## 2019-05-20 NOTE — Telephone Encounter (Signed)
PER CHART REVIEW _______________________________  Admit date: 05/15/2019 Discharge date: 05/16/2019  Time spent: 4minutes  Recommendations for Outpatient Follow-up:  PCP Dr. Yong Channel in 1 week  Discharge Diagnoses:  Principal Problem:   Fall at home, initial encounter Rib fractures of the ninth and 10th right lateral   Gout   BPH associated with nocturia   Hyperlipidemia   GERD (gastroesophageal reflux disease)   Right rib fracture   Discharge Condition: Stable  Diet recommendation: Regular Admit date: 05/15/2019 Discharge date: 05/16/2019  Time spent: 31minutes  Recommendations for Outpatient Follow-up:  PCP Dr. Yong Channel in 1 week  Discharge Diagnoses:  Principal Problem:   Fall at home, initial encounter Rib fractures of the ninth and 10th right lateral   Gout   BPH associated with nocturia   Hyperlipidemia   GERD (gastroesophageal reflux disease)   Right rib fracture   Discharge Condition: Stable  Diet recommendation: Regular  PER TELEPHONE CALL ______________________________  Transition Care Management Follow-up Telephone Call   Date discharged? 05/16/19   How have you been since you were released from the hospital? Very uncomfortable.   Do you understand why you were in the hospital? yes   Do you understand the discharge instructions? yes   Where were you discharged to? Home   Items Reviewed:  Medications reviewed: yes  Allergies reviewed: yes  Dietary changes reviewed: yes  Referrals reviewed: yes   Functional Questionnaire:   Activities of Daily Living (ADLs):   He states they are independent in the following: ambulation, continence and toileting States they require assistance with the following: bathing and hygiene, grooming and dressing   Any transportation issues/concerns?: no   Any patient concerns? no   Confirmed importance and date/time of follow-up visits scheduled yes  Provider Appointment booked with  Dr. Yong Channel 05/21/19 @ 10 am   Confirmed with patient if condition begins to worsen call PCP or go to the ER.  Patient was given the office number and encouraged to call back with question or concerns.  : yes

## 2019-05-20 NOTE — Telephone Encounter (Signed)
Home Health Verbal Orders - Caller/Agency: [facility] / [facility] Number: [phone] ok to LM Requesting OT/PT/Skilled Nursing/Social Work/Speech Therapy: PT Frequency: 2 x 3 wks, 1 x 3 wks effective 05/19/2019
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
## 2019-05-20 NOTE — Telephone Encounter (Signed)
Called [facility] and gave VO for PT per message.  Confirmed pt's appt w/ Dr. Yong Channel tomorrow at 10 am.

## 2019-05-21 ENCOUNTER — Encounter: Payer: Self-pay | Admitting: Family Medicine

## 2019-05-21 ENCOUNTER — Other Ambulatory Visit: Payer: Self-pay

## 2019-05-21 ENCOUNTER — Ambulatory Visit (INDEPENDENT_AMBULATORY_CARE_PROVIDER_SITE_OTHER): Payer: Medicare Other | Admitting: Family Medicine

## 2019-05-21 VITALS — BP 100/60 | HR 69 | Temp 97.6°F | Ht 70.0 in | Wt 167.0 lb

## 2019-05-21 DIAGNOSIS — M1A0790 Idiopathic chronic gout, unspecified ankle and foot, without tophus (tophi): Secondary | ICD-10-CM

## 2019-05-21 DIAGNOSIS — E785 Hyperlipidemia, unspecified: Secondary | ICD-10-CM

## 2019-05-21 DIAGNOSIS — E559 Vitamin D deficiency, unspecified: Secondary | ICD-10-CM | POA: Diagnosis not present

## 2019-05-21 DIAGNOSIS — D696 Thrombocytopenia, unspecified: Secondary | ICD-10-CM

## 2019-05-21 DIAGNOSIS — S81801A Unspecified open wound, right lower leg, initial encounter: Secondary | ICD-10-CM | POA: Diagnosis not present

## 2019-05-21 DIAGNOSIS — S2241XA Multiple fractures of ribs, right side, initial encounter for closed fracture: Secondary | ICD-10-CM

## 2019-05-21 DIAGNOSIS — Z9181 History of falling: Secondary | ICD-10-CM

## 2019-05-21 DIAGNOSIS — R739 Hyperglycemia, unspecified: Secondary | ICD-10-CM | POA: Diagnosis not present

## 2019-05-21 DIAGNOSIS — D649 Anemia, unspecified: Secondary | ICD-10-CM

## 2019-05-21 DIAGNOSIS — I700 Atherosclerosis of aorta: Secondary | ICD-10-CM | POA: Diagnosis not present

## 2019-05-21 DIAGNOSIS — K219 Gastro-esophageal reflux disease without esophagitis: Secondary | ICD-10-CM

## 2019-05-21 LAB — COMPREHENSIVE METABOLIC PANEL
ALT: 19 U/L (ref 0–53)
AST: 19 U/L (ref 0–37)
Albumin: 4 g/dL (ref 3.5–5.2)
Alkaline Phosphatase: 98 U/L (ref 39–117)
BUN: 18 mg/dL (ref 6–23)
CO2: 29 mEq/L (ref 19–32)
Calcium: 8.6 mg/dL (ref 8.4–10.5)
Chloride: 105 mEq/L (ref 96–112)
Creatinine, Ser: 0.93 mg/dL (ref 0.40–1.50)
GFR: 76.12 mL/min (ref >60.00)
Glucose, Bld: 73 mg/dL (ref 70–99)
Potassium: 4.2 mEq/L (ref 3.5–5.1)
Sodium: 142 mEq/L (ref 135–145)
Total Bilirubin: 0.6 mg/dL (ref 0.2–1.2)
Total Protein: 6.2 g/dL (ref 6.0–8.3)

## 2019-05-21 LAB — CBC
HCT: 38.3 % — ABNORMAL LOW (ref 39.0–52.0)
Hemoglobin: 12.9 g/dL — ABNORMAL LOW (ref 13.0–17.0)
MCHC: 33.6 g/dL (ref 30.0–36.0)
MCV: 93.6 fl (ref 78.0–100.0)
Platelets: 167 10*3/uL (ref 150.0–400.0)
RBC: 4.09 Mil/uL — ABNORMAL LOW (ref 4.22–5.81)
RDW: 14.3 % (ref 11.5–15.5)
WBC: 7.4 10*3/uL (ref 4.0–10.5)

## 2019-05-21 LAB — HEMOGLOBIN A1C: Hgb A1c MFr Bld: 6.1 % (ref 4.6–6.5)

## 2019-05-21 LAB — VITAMIN D 25 HYDROXY (VIT D DEFICIENCY, FRACTURES): VITD: 36.67 ng/mL (ref 30.00–100.00)

## 2019-05-21 NOTE — Patient Instructions (Signed)
Please stop by lab before you go If you do not have mychart- we will call you about results within 5 business days of [place] receiving them.  If you have mychart- we will send your results within 3 business days of [place] receiving them.  If abnormal or we want to clarify a result, we will call or mychart you to make sure you receive the message.  If you have questions or concerns or don't hear within 5-7 days, please send [place] a message or call us.   Theadora Rama is going to put some antibiotic ointment on the ankle, a non stick dressing and then wrap it. Once it scabs over can stop dressing it like this but want to make sure we give it space to heal  Keep an eye on redness, warmth, pain around the right ankle wound- if this gets worse call me and I will put you on an antibiotic- right now it does not look infected.

## 2019-05-23 DIAGNOSIS — M109 Gout, unspecified: Secondary | ICD-10-CM | POA: Diagnosis not present

## 2019-05-23 DIAGNOSIS — J90 Pleural effusion, not elsewhere classified: Secondary | ICD-10-CM | POA: Diagnosis not present

## 2019-05-23 DIAGNOSIS — S2241XD Multiple fractures of ribs, right side, subsequent encounter for fracture with routine healing: Secondary | ICD-10-CM | POA: Diagnosis not present

## 2019-05-23 DIAGNOSIS — G629 Polyneuropathy, unspecified: Secondary | ICD-10-CM | POA: Diagnosis not present

## 2019-05-23 DIAGNOSIS — W19XXXD Unspecified fall, subsequent encounter: Secondary | ICD-10-CM | POA: Diagnosis not present

## 2019-05-23 DIAGNOSIS — M19041 Primary osteoarthritis, right hand: Secondary | ICD-10-CM | POA: Diagnosis not present

## 2019-05-26 DIAGNOSIS — S2241XD Multiple fractures of ribs, right side, subsequent encounter for fracture with routine healing: Secondary | ICD-10-CM | POA: Diagnosis not present

## 2019-05-26 DIAGNOSIS — M109 Gout, unspecified: Secondary | ICD-10-CM | POA: Diagnosis not present

## 2019-05-26 DIAGNOSIS — J90 Pleural effusion, not elsewhere classified: Secondary | ICD-10-CM | POA: Diagnosis not present

## 2019-05-26 DIAGNOSIS — M19041 Primary osteoarthritis, right hand: Secondary | ICD-10-CM | POA: Diagnosis not present

## 2019-05-26 DIAGNOSIS — W19XXXD Unspecified fall, subsequent encounter: Secondary | ICD-10-CM | POA: Diagnosis not present

## 2019-05-26 DIAGNOSIS — G629 Polyneuropathy, unspecified: Secondary | ICD-10-CM | POA: Diagnosis not present

## 2019-05-28 DIAGNOSIS — J90 Pleural effusion, not elsewhere classified: Secondary | ICD-10-CM | POA: Diagnosis not present

## 2019-05-28 DIAGNOSIS — M109 Gout, unspecified: Secondary | ICD-10-CM | POA: Diagnosis not present

## 2019-05-28 DIAGNOSIS — W19XXXD Unspecified fall, subsequent encounter: Secondary | ICD-10-CM | POA: Diagnosis not present

## 2019-05-28 DIAGNOSIS — G629 Polyneuropathy, unspecified: Secondary | ICD-10-CM | POA: Diagnosis not present

## 2019-05-28 DIAGNOSIS — M19041 Primary osteoarthritis, right hand: Secondary | ICD-10-CM | POA: Diagnosis not present

## 2019-05-28 DIAGNOSIS — S2241XD Multiple fractures of ribs, right side, subsequent encounter for fracture with routine healing: Secondary | ICD-10-CM | POA: Diagnosis not present

## 2019-05-29 DIAGNOSIS — N401 Enlarged prostate with lower urinary tract symptoms: Secondary | ICD-10-CM | POA: Diagnosis not present

## 2019-05-29 DIAGNOSIS — M109 Gout, unspecified: Secondary | ICD-10-CM

## 2019-05-29 DIAGNOSIS — S2241XD Multiple fractures of ribs, right side, subsequent encounter for fracture with routine healing: Secondary | ICD-10-CM | POA: Diagnosis not present

## 2019-05-29 DIAGNOSIS — G629 Polyneuropathy, unspecified: Secondary | ICD-10-CM | POA: Diagnosis not present

## 2019-05-29 DIAGNOSIS — M19041 Primary osteoarthritis, right hand: Secondary | ICD-10-CM | POA: Diagnosis not present

## 2019-05-29 DIAGNOSIS — R351 Nocturia: Secondary | ICD-10-CM | POA: Diagnosis not present

## 2019-05-29 DIAGNOSIS — J90 Pleural effusion, not elsewhere classified: Secondary | ICD-10-CM | POA: Diagnosis not present

## 2019-05-29 DIAGNOSIS — E785 Hyperlipidemia, unspecified: Secondary | ICD-10-CM | POA: Diagnosis not present

## 2019-05-29 DIAGNOSIS — W19XXXD Unspecified fall, subsequent encounter: Secondary | ICD-10-CM | POA: Diagnosis not present

## 2019-05-29 DIAGNOSIS — M19042 Primary osteoarthritis, left hand: Secondary | ICD-10-CM

## 2019-05-29 DIAGNOSIS — K219 Gastro-esophageal reflux disease without esophagitis: Secondary | ICD-10-CM

## 2019-05-29 DIAGNOSIS — I700 Atherosclerosis of aorta: Secondary | ICD-10-CM

## 2019-05-29 DIAGNOSIS — Z9181 History of falling: Secondary | ICD-10-CM

## 2019-05-29 DIAGNOSIS — E559 Vitamin D deficiency, unspecified: Secondary | ICD-10-CM | POA: Diagnosis not present

## 2019-06-02 DIAGNOSIS — G629 Polyneuropathy, unspecified: Secondary | ICD-10-CM | POA: Diagnosis not present

## 2019-06-02 DIAGNOSIS — S2241XD Multiple fractures of ribs, right side, subsequent encounter for fracture with routine healing: Secondary | ICD-10-CM | POA: Diagnosis not present

## 2019-06-02 DIAGNOSIS — J90 Pleural effusion, not elsewhere classified: Secondary | ICD-10-CM | POA: Diagnosis not present

## 2019-06-02 DIAGNOSIS — W19XXXD Unspecified fall, subsequent encounter: Secondary | ICD-10-CM | POA: Diagnosis not present

## 2019-06-02 DIAGNOSIS — M19041 Primary osteoarthritis, right hand: Secondary | ICD-10-CM | POA: Diagnosis not present

## 2019-06-02 DIAGNOSIS — M109 Gout, unspecified: Secondary | ICD-10-CM | POA: Diagnosis not present

## 2019-06-05 DIAGNOSIS — M109 Gout, unspecified: Secondary | ICD-10-CM | POA: Diagnosis not present

## 2019-06-05 DIAGNOSIS — G629 Polyneuropathy, unspecified: Secondary | ICD-10-CM | POA: Diagnosis not present

## 2019-06-05 DIAGNOSIS — M19041 Primary osteoarthritis, right hand: Secondary | ICD-10-CM | POA: Diagnosis not present

## 2019-06-05 DIAGNOSIS — W19XXXD Unspecified fall, subsequent encounter: Secondary | ICD-10-CM | POA: Diagnosis not present

## 2019-06-05 DIAGNOSIS — S2241XD Multiple fractures of ribs, right side, subsequent encounter for fracture with routine healing: Secondary | ICD-10-CM | POA: Diagnosis not present

## 2019-06-05 DIAGNOSIS — J90 Pleural effusion, not elsewhere classified: Secondary | ICD-10-CM | POA: Diagnosis not present

## 2019-06-11 ENCOUNTER — Telehealth: Payer: Self-pay

## 2019-06-11 NOTE — Telephone Encounter (Signed)
[place] form faxed received 06/10/2019. PPW faxed to [facility] 06/11/2019.    Fax confirmation received no further action needed!

## 2019-06-12 DIAGNOSIS — M109 Gout, unspecified: Secondary | ICD-10-CM | POA: Diagnosis not present

## 2019-06-12 DIAGNOSIS — W19XXXD Unspecified fall, subsequent encounter: Secondary | ICD-10-CM | POA: Diagnosis not present

## 2019-06-12 DIAGNOSIS — G629 Polyneuropathy, unspecified: Secondary | ICD-10-CM | POA: Diagnosis not present

## 2019-06-12 DIAGNOSIS — M19041 Primary osteoarthritis, right hand: Secondary | ICD-10-CM | POA: Diagnosis not present

## 2019-06-12 DIAGNOSIS — S2241XD Multiple fractures of ribs, right side, subsequent encounter for fracture with routine healing: Secondary | ICD-10-CM | POA: Diagnosis not present

## 2019-06-12 DIAGNOSIS — J90 Pleural effusion, not elsewhere classified: Secondary | ICD-10-CM | POA: Diagnosis not present

## 2019-06-18 DIAGNOSIS — E785 Hyperlipidemia, unspecified: Secondary | ICD-10-CM | POA: Diagnosis not present

## 2019-06-18 DIAGNOSIS — M109 Gout, unspecified: Secondary | ICD-10-CM | POA: Diagnosis not present

## 2019-06-18 DIAGNOSIS — M19041 Primary osteoarthritis, right hand: Secondary | ICD-10-CM | POA: Diagnosis not present

## 2019-06-18 DIAGNOSIS — G629 Polyneuropathy, unspecified: Secondary | ICD-10-CM | POA: Diagnosis not present

## 2019-06-18 DIAGNOSIS — M19042 Primary osteoarthritis, left hand: Secondary | ICD-10-CM | POA: Diagnosis not present

## 2019-06-18 DIAGNOSIS — S2241XD Multiple fractures of ribs, right side, subsequent encounter for fracture with routine healing: Secondary | ICD-10-CM | POA: Diagnosis not present

## 2019-06-18 DIAGNOSIS — N401 Enlarged prostate with lower urinary tract symptoms: Secondary | ICD-10-CM | POA: Diagnosis not present

## 2019-06-18 DIAGNOSIS — J90 Pleural effusion, not elsewhere classified: Secondary | ICD-10-CM | POA: Diagnosis not present

## 2019-06-18 DIAGNOSIS — I700 Atherosclerosis of aorta: Secondary | ICD-10-CM | POA: Diagnosis not present

## 2019-06-18 DIAGNOSIS — E559 Vitamin D deficiency, unspecified: Secondary | ICD-10-CM | POA: Diagnosis not present

## 2019-06-18 DIAGNOSIS — Z9181 History of falling: Secondary | ICD-10-CM | POA: Diagnosis not present

## 2019-06-18 DIAGNOSIS — K219 Gastro-esophageal reflux disease without esophagitis: Secondary | ICD-10-CM | POA: Diagnosis not present

## 2019-06-18 DIAGNOSIS — W19XXXD Unspecified fall, subsequent encounter: Secondary | ICD-10-CM | POA: Diagnosis not present

## 2019-06-18 DIAGNOSIS — R351 Nocturia: Secondary | ICD-10-CM | POA: Diagnosis not present

## 2019-06-19 DIAGNOSIS — G629 Polyneuropathy, unspecified: Secondary | ICD-10-CM | POA: Diagnosis not present

## 2019-06-19 DIAGNOSIS — M109 Gout, unspecified: Secondary | ICD-10-CM | POA: Diagnosis not present

## 2019-06-19 DIAGNOSIS — J90 Pleural effusion, not elsewhere classified: Secondary | ICD-10-CM | POA: Diagnosis not present

## 2019-06-19 DIAGNOSIS — M19041 Primary osteoarthritis, right hand: Secondary | ICD-10-CM | POA: Diagnosis not present

## 2019-06-19 DIAGNOSIS — W19XXXD Unspecified fall, subsequent encounter: Secondary | ICD-10-CM | POA: Diagnosis not present

## 2019-06-19 DIAGNOSIS — S2241XD Multiple fractures of ribs, right side, subsequent encounter for fracture with routine healing: Secondary | ICD-10-CM | POA: Diagnosis not present

## 2019-06-24 DIAGNOSIS — M19041 Primary osteoarthritis, right hand: Secondary | ICD-10-CM | POA: Diagnosis not present

## 2019-06-24 DIAGNOSIS — M109 Gout, unspecified: Secondary | ICD-10-CM | POA: Diagnosis not present

## 2019-06-24 DIAGNOSIS — G629 Polyneuropathy, unspecified: Secondary | ICD-10-CM | POA: Diagnosis not present

## 2019-06-24 DIAGNOSIS — S2241XD Multiple fractures of ribs, right side, subsequent encounter for fracture with routine healing: Secondary | ICD-10-CM | POA: Diagnosis not present

## 2019-06-24 DIAGNOSIS — W19XXXD Unspecified fall, subsequent encounter: Secondary | ICD-10-CM | POA: Diagnosis not present

## 2019-06-24 DIAGNOSIS — J90 Pleural effusion, not elsewhere classified: Secondary | ICD-10-CM | POA: Diagnosis not present

## 2019-07-15 DIAGNOSIS — D1801 Hemangioma of skin and subcutaneous tissue: Secondary | ICD-10-CM | POA: Diagnosis not present

## 2019-07-15 DIAGNOSIS — L720 Epidermal cyst: Secondary | ICD-10-CM | POA: Diagnosis not present

## 2019-07-15 DIAGNOSIS — L821 Other seborrheic keratosis: Secondary | ICD-10-CM | POA: Diagnosis not present

## 2019-07-15 DIAGNOSIS — L723 Sebaceous cyst: Secondary | ICD-10-CM | POA: Diagnosis not present

## 2019-07-15 DIAGNOSIS — Z85828 Personal history of other malignant neoplasm of skin: Secondary | ICD-10-CM | POA: Diagnosis not present

## 2019-07-15 DIAGNOSIS — L812 Freckles: Secondary | ICD-10-CM | POA: Diagnosis not present

## 2019-07-15 DIAGNOSIS — D485 Neoplasm of uncertain behavior of skin: Secondary | ICD-10-CM | POA: Diagnosis not present

## 2019-08-01 DIAGNOSIS — Z23 Encounter for immunization: Secondary | ICD-10-CM | POA: Diagnosis not present

## 2019-09-23 ENCOUNTER — Telehealth: Payer: Self-pay | Admitting: Family Medicine

## 2019-09-23 NOTE — Telephone Encounter (Signed)
I left a message asking the patient to call and schedule Medicare AWV with Courtney ([facility] Health Coach).  If patient calls back, please schedule Medicare Wellness Visit at next available opening.  VDM (Dee-Dee) °

## 2019-10-27 ENCOUNTER — Other Ambulatory Visit: Payer: Self-pay

## 2019-10-28 ENCOUNTER — Encounter: Payer: Self-pay | Admitting: Family Medicine

## 2019-10-28 ENCOUNTER — Ambulatory Visit (INDEPENDENT_AMBULATORY_CARE_PROVIDER_SITE_OTHER): Payer: Medicare Other

## 2019-10-28 ENCOUNTER — Ambulatory Visit (INDEPENDENT_AMBULATORY_CARE_PROVIDER_SITE_OTHER): Payer: Medicare Other | Admitting: Family Medicine

## 2019-10-28 VITALS — BP 124/62 | HR 64 | Temp 98.1°F | Ht 70.0 in | Wt 167.0 lb

## 2019-10-28 DIAGNOSIS — N401 Enlarged prostate with lower urinary tract symptoms: Secondary | ICD-10-CM

## 2019-10-28 DIAGNOSIS — G629 Polyneuropathy, unspecified: Secondary | ICD-10-CM

## 2019-10-28 DIAGNOSIS — Z0000 Encounter for general adult medical examination without abnormal findings: Secondary | ICD-10-CM | POA: Diagnosis not present

## 2019-10-28 DIAGNOSIS — R351 Nocturia: Secondary | ICD-10-CM | POA: Diagnosis not present

## 2019-10-28 DIAGNOSIS — E785 Hyperlipidemia, unspecified: Secondary | ICD-10-CM | POA: Diagnosis not present

## 2019-10-28 DIAGNOSIS — M1A0790 Idiopathic chronic gout, unspecified ankle and foot, without tophus (tophi): Secondary | ICD-10-CM | POA: Diagnosis not present

## 2019-10-28 NOTE — Patient Instructions (Signed)
Allen Robinson , Thank you for taking time to come for your Medicare Wellness Visit. I appreciate your ongoing commitment to your health goals. Please review the following plan we discussed and let me know if I can assist you in the future.   Screening recommendations/referrals: Colorectal Screening: No longer indicated   Vision and Dental Exams: Recommended annual ophthalmology exams for early detection of glaucoma and other disorders of the eye Recommended annual dental exams for proper oral hygiene  Vaccinations: Influenza vaccine: we will obtain records from the pharmacy   Pneumococcal vaccine: up to date; last 01/23/14 Tdap vaccine: up to date; last 04/30/17 Shingles vaccine: Please call your insurance company to determine your out of pocket expense for the Shingrix vaccine. You may receive this vaccine at your local pharmacy.  Advanced directives: Please bring a copy of your POA (Power of Attorney) and/or Living Will to your next appointment.  Goals: Recommend to drink at least 6-8 8oz glasses of water per day and consume a balanced diet rich in fresh fruits and vegetables.   Next appointment: Please schedule your Annual Wellness Visit with your Nurse Health Advisor in one year.  Preventive Care 83 Years and Older, Male Preventive care refers to lifestyle choices and visits with your health care provider that can promote health and wellness. What does preventive care include?  A yearly physical exam. This is also called an annual well check.  Dental exams once or twice a year.  Routine eye exams. Ask your health care provider how often you should have your eyes checked.  Personal lifestyle choices, including:  Daily care of your teeth and gums.  Regular physical activity.  Eating a healthy diet.  Avoiding tobacco and drug use.  Limiting alcohol use.  Practicing safe sex.  Taking low doses of aspirin every day if recommended by your health care provider..  Taking vitamin  and mineral supplements as recommended by your health care provider. What happens during an annual well check? The services and screenings done by your health care provider during your annual well check will depend on your age, overall health, lifestyle risk factors, and family history of disease. Counseling  Your health care provider may ask you questions about your:  Alcohol use.  Tobacco use.  Drug use.  Emotional well-being.  Home and relationship well-being.  Sexual activity.  Eating habits.  History of falls.  Memory and ability to understand (cognition).  Work and work Statistician. Screening  You may have the following tests or measurements:  Height, weight, and BMI.  Blood pressure.  Lipid and cholesterol levels. These may be checked every 5 years, or more frequently if you are over 83 years old.  Skin check.  Lung cancer screening. You may have this screening every year starting at age 83 if you have a 30-pack-year history of smoking and currently smoke or have quit within the past 15 years.  Fecal occult blood test (FOBT) of the stool. You may have this test every year starting at age 83.  Flexible sigmoidoscopy or colonoscopy. You may have a sigmoidoscopy every 5 years or a colonoscopy every 10 years starting at age 83.  Prostate cancer screening. Recommendations will vary depending on your family history and other risks.  Hepatitis C blood test.  Hepatitis B blood test.  Sexually transmitted disease (STD) testing.  Diabetes screening. This is done by checking your blood sugar (glucose) after you have not eaten for a while (fasting). You may have this done every 1-3  years.  Abdominal aortic aneurysm (AAA) screening. You may need this if you are a current or former smoker.  Osteoporosis. You may be screened starting at age 83 if you are at high risk. Talk with your health care provider about your test results, treatment options, and if necessary, the  need for more tests. Vaccines  Your health care provider may recommend certain vaccines, such as:  Influenza vaccine. This is recommended every year.  Tetanus, diphtheria, and acellular pertussis (Tdap, Td) vaccine. You may need a Td booster every 10 years.  Zoster vaccine. You may need this after age 83.  Pneumococcal 13-valent conjugate (PCV13) vaccine. One dose is recommended after age 83.  Pneumococcal polysaccharide (PPSV23) vaccine. One dose is recommended after age 83. Talk to your health care provider about which screenings and vaccines you need and how often you need them. This information is not intended to replace advice given to you by your health care provider. Make sure you discuss any questions you have with your health care provider. Document Released: 12/03/2015 Document Revised: 07/26/2016 Document Reviewed: 09/07/2015 Elsevier Interactive Patient Education  2017 [place] Prevention in the Home Falls can cause injuries. They can happen to people of all ages. There are many things you can do to make your home safe and to help prevent falls. What can I do on the outside of my home?  Regularly fix the edges of walkways and driveways and fix any cracks.  Remove anything that might make you trip as you walk through a door, such as a raised step or threshold.  Trim any bushes or trees on the path to your home.  Use bright outdoor lighting.  Clear any walking paths of anything that might make someone trip, such as rocks or tools.  Regularly check to see if handrails are loose or broken. Make sure that both sides of any steps have handrails.  Any raised decks and porches should have guardrails on the edges.  Have any leaves, snow, or ice cleared regularly.  Use sand or salt on walking paths during winter.  Clean up any spills in your garage right away. This includes oil or grease spills. What can I do in the bathroom?  Use night lights.  Install grab  bars by the toilet and in the tub and shower. Do not use towel bars as grab bars.  Use non-skid mats or decals in the tub or shower.  If you need to sit down in the shower, use a plastic, non-slip stool.  Keep the floor dry. Clean up any water that spills on the floor as soon as it happens.  Remove soap buildup in the tub or shower regularly.  Attach bath mats securely with double-sided non-slip rug tape.  Do not have throw rugs and other things on the floor that can make you trip. What can I do in the bedroom?  Use night lights.  Make sure that you have a light by your bed that is easy to reach.  Do not use any sheets or blankets that are too big for your bed. They should not hang down onto the floor.  Have a firm chair that has side arms. You can use this for support while you get dressed.  Do not have throw rugs and other things on the floor that can make you trip. What can I do in the kitchen?  Clean up any spills right away.  Avoid walking on wet floors.  Keep items that  you use a lot in easy-to-reach places.  If you need to reach something above you, use a strong step stool that has a grab bar.  Keep electrical cords out of the way.  Do not use floor polish or wax that makes floors slippery. If you must use wax, use non-skid floor wax.  Do not have throw rugs and other things on the floor that can make you trip. What can I do with my stairs?  Do not leave any items on the stairs.  Make sure that there are handrails on both sides of the stairs and use them. Fix handrails that are broken or loose. Make sure that handrails are as long as the stairways.  Check any carpeting to make sure that it is firmly attached to the stairs. Fix any carpet that is loose or worn.  Avoid having throw rugs at the top or bottom of the stairs. If you do have throw rugs, attach them to the floor with carpet tape.  Make sure that you have a light switch at the top of the stairs and the  bottom of the stairs. If you do not have them, ask someone to add them for you. What else can I do to help prevent falls?  Wear shoes that:  Do not have high heels.  Have rubber bottoms.  Are comfortable and fit you well.  Are closed at the toe. Do not wear sandals.  If you use a stepladder:  Make sure that it is fully opened. Do not climb a closed stepladder.  Make sure that both sides of the stepladder are locked into place.  Ask someone to hold it for you, if possible.  Clearly mark and make sure that you can see:  Any grab bars or handrails.  First and last steps.  Where the edge of each step is.  Use tools that help you move around (mobility aids) if they are needed. These include:  Canes.  Walkers.  Scooters.  Crutches.  Turn on the lights when you go into a dark area. Replace any light bulbs as soon as they burn out.  Set up your furniture so you have a clear path. Avoid moving your furniture around.  If any of your floors are uneven, fix them.  If there are any pets around you, be aware of where they are.  Review your medicines with your doctor. Some medicines can make you feel dizzy. This can increase your chance of falling. Ask your doctor what other things that you can do to help prevent falls. This information is not intended to replace advice given to you by your health care provider. Make sure you discuss any questions you have with your health care provider. Document Released: 09/02/2009 Document Revised: 04/13/2016 Document Reviewed: 12/11/2014 Elsevier Interactive Patient Education  2017 [employer].

## 2019-10-28 NOTE — Assessment & Plan Note (Signed)
S: he does exercises given by PT- feels likes him stay in shape but doesn't help with blance as much.    Ongoing balance issues for several years.  Has seen neurology in the past.  Had DAT scan in 2018 which suggested low risk for Parkinson's.  Has also had MRI of the brain in 2017.  Reviewed notes from Dr. Jaynee Eagles A/P: Patient wanted to know the cause of his balance issues-we discussed likely neuropathy related.  He should continue his home exercises.  Offered new physical therapy evaluation and he declines for now. -Strongly encouraged cane or walker

## 2019-10-28 NOTE — Progress Notes (Addendum)
Phone [phone] In person visit   Subjective:   Allen Robinson is a 85 y.o. year old very pleasant male patient who presents for/with See problem oriented charting Chief Complaint  Patient presents with  . Follow-up    6 month f/u hyperlipidemia and closed rib fracture    ROS- Review of Systems  Constitutional: Positive for weight loss.  HENT: Negative.   Eyes: Negative.   Respiratory: Negative.   Cardiovascular: Negative.   Gastrointestinal: Positive for heartburn.  Genitourinary: Negative.   Musculoskeletal: Positive for back pain.  Skin: Negative.   Neurological: Negative.   Endo/Heme/Allergies: Bruises/bleeds easily.  Psychiatric/Behavioral: Negative.      This visit occurred during the SARS-CoV-2 public health emergency.  Safety protocols were in place, including screening questions prior to the visit, additional usage of staff PPE, and extensive cleaning of exam room while observing appropriate contact time as indicated for disinfecting solutions.   Past Medical History-  Patient Active Problem List   Diagnosis Date Noted  . Gout 03/14/2017    Priority: Medium  . BPH associated with nocturia 03/14/2017    Priority: Medium  . Hyperlipidemia 03/14/2017    Priority: Medium  . GERD (gastroesophageal reflux disease) 03/14/2017    Priority: Medium  . Hyperglycemia 04/28/2019    Priority: Low  . Vitamin D deficiency 03/14/2017    Priority: Low  . Peripheral polyneuropathy 02/16/2016    Priority: Low  . Diplopia 11/19/2015    Priority: Low  . Aortic atherosclerosis ([place]) 05/21/2019  . Fall at home, initial encounter 05/15/2019  . Right rib fracture 05/15/2019  . Insect bite of left ankle 04/30/2017    Medications- reviewed and updated Current Outpatient Medications  Medication Sig Dispense Refill  . acetaminophen (TYLENOL) 500 MG tablet Take 1 tablet (500 mg total) by mouth every 6 (six) hours as needed for mild pain or moderate pain (or Fever >/=  101).    [name] allopurinol (ZYLOPRIM) 100 MG tablet Take 100 mg by mouth 2 (two) times daily.     . Cholecalciferol (VITAMIN D3) 250 MCG (10000 UT) TABS Take 10,000 Units by mouth daily.    . colchicine 0.6 MG tablet Take 0.6 mg by mouth as needed.    . finasteride (PROSCAR) 5 MG tablet Take 5 mg by mouth daily.    [name] omeprazole (PRILOSEC) 20 MG capsule Take 20 mg by mouth daily as needed.     . simvastatin (ZOCOR) 40 MG tablet Take 20 mg by mouth at bedtime.     . vitamin B-12 (CYANOCOBALAMIN) 500 MCG tablet Take 500 mcg by mouth daily.     No current facility-administered medications for this visit.      Objective:  BP 124/62 (BP Location: Left Arm, Patient Position: Sitting, Cuff Size: Normal)   Pulse 64   Temp 98.1 F (36.7 C) (Temporal)   Ht 5\' 10"  (1.778 m)   Wt 167 lb (75.8 kg)   SpO2 95%   BMI 23.96 kg/m  Gen: NAD, resting comfortably CV: RRR no murmurs rubs or gallops Lungs: CTAB no crackles, wheeze, rhonchi Abdomen: soft/nontender/nondistended/normal bowel sounds. Ext: no edema Skin: warm, dry Neuro: Able to stand without difficulty today, slight imbalance but regains composure easily    Assessment and Plan   #Influenza vaccination-[facility] to add into chart  #Gout S: 0 flares recently on allopurinol 100 mg twice a day Lab Results  Component Value Date   LABURIC 5.2 03/18/2018  A/P: Stable.  Continue current medications.  He will drop off labs from the [place] in March  #hyperlipidemia S: compliant with simvastatin 40 mg.  Reasonable control considering using for primary prevention-likely would not stop unless he had issues with the medication Lab Results  Component Value Date   CHOL 129 01/27/2019   HDL 30 (A) 01/27/2019   LDLCALC 78 01/27/2019   TRIG 103 01/27/2019   A/P: Reasonable control-continue simvastatin 40 mg.  He will drop off labs in March 2021  # Constipation- Intermittent issue.  Reasonable control with Colace as needed.   Continue use as needed  # Balance issues/neuropathy S: he does exercises given by PT- feels likes him stay in shape but doesn't help with blance as much.    Ongoing balance issues for several years.  Has seen neurology in the past.  Had DAT scan in 2018 which suggested low risk for Parkinson's.  Has also had MRI of the brain in 2017.  Reviewed notes from Dr. Jaynee Eagles A/P: Patient wanted to know the cause of his balance issues-we discussed likely neuropathy related.  He should continue his home exercises.  Offered new physical therapy evaluation and he declines for now. -Strongly encouraged cane or walker -Feels like his risk of falls is greatest outdoors when he is working in the yard-I encouraged him to consider not doing this  # BPH S:remains on finasteride 5 mg but still gets up to pee 3x a night.   A/P: Patient is not sure the medication is helping.  We discussed potentially trialing off the medicine but discussed how symptoms may slowly return and then it could take up to 6 months for medication to be effective again-he declines this trial.  We discussed option like Flomax but this would likely increase his fall risk so we opted against  Recommended follow up: Return in about 6 months (around 04/27/2020) for follow up- or sooner if needed. Future Appointments  Date Time Provider [place]  05/03/2020 10:40 AM Marin Olp, MD [facility]    Lab/Order associations:   ICD-10-CM   1. Chronic idiopathic gout involving toe without tophus, unspecified laterality  M1A.0790   2. Hyperlipidemia, unspecified hyperlipidemia type  E78.5   3. BPH associated with nocturia  N40.1    R35.1   4. Peripheral polyneuropathy  G62.9    Return precautions advised.  Garret Reddish, MD

## 2019-10-28 NOTE — Patient Instructions (Addendum)
Health Maintenance Due  Topic Date Due  . [facility]  [facility]21/2019   No changes today. I would love if you would at minimum use a cane regularly but honestly with your neuropathy would be best to use a walker. Continue your home exercises you were given  Recommended follow up: Return in about 6 months (around 04/27/2020) for follow up- or sooner if needed.

## 2019-10-28 NOTE — Progress Notes (Addendum)
Subjective:   Allen Robinson is a 85 y.o. male who presents for Medicare Annual/Subsequent preventive examination.  Review of Systems:   Cardiac Risk Factors include: advanced age (>32mn, >>79women);hypertension;male gender    Objective:    Vitals: BP 124/62 (BP Location: Left Arm, Patient Position: Sitting, Cuff Size: Normal)   Pulse 64   Temp 98.1 F (36.7 C) (Temporal)   Ht '5\' 10"'  (1.778 m)   Wt 167 lb (75.8 kg)   SpO2 95%   BMI 23.96 kg/m   Body mass index is 23.96 kg/m.  Advanced Directives 10/28/2019 04/18/2018 03/14/2017  Does Patient Have a Medical Advance Directive? Yes Yes Yes  Type of Advance Directive Living will;Healthcare Power of Attorney - -  Does patient want to make changes to medical advance directive? No - Patient declined - -  Copy of [place]? No - copy requested - -    Tobacco Social History   Tobacco Use  Smoking Status Never Smoker  Smokeless Tobacco Never Used     Counseling given: Not Answered   Clinical Intake:  Pre-visit preparation completed: Yes  Pain : No/denies pain  Diabetes: No  How often do you need to have someone help you when you read instructions, pamphlets, or other written materials from your doctor or pharmacy?: 1 - Never  Interpreter Needed?: No  Information entered by :: CDenman GeorgeLPN  Past Medical History:  Diagnosis Date  . Arthritis    hands  . Chicken pox   . Hyperlipidemia   . Pneumonia   . Seasonal allergies    Past Surgical History:  Procedure Laterality Date  . TONSILLECTOMY     1936   Family History  Problem Relation Age of Onset  . Heart failure Mother   . Pneumonia Mother        died 918 . Stroke Father        868 . Neuropathy Neg Hx    Social History   Socioeconomic History  . Marital status: Married    Spouse name: [name]  . Number of children: 2  . Years of education: 165 . Highest education level: Not on file  Occupational History  .  Not on file  Social Needs  . Financial resource strain: Not on file  . Food insecurity    Worry: Not on file    Inability: Not on file  . Transportation needs    Medical: Not on file    Non-medical: Not on file  Tobacco Use  . Smoking status: Never Smoker  . Smokeless tobacco: Never Used  Substance and Sexual Activity  . Alcohol use: Yes    Alcohol/week: 0.0 standard drinks    Comment: no longer drinking. prior social  . Drug use: No  . Sexual activity: Not on file  Lifestyle  . Physical activity    Days per week: Not on file    Minutes per session: Not on file  . Stress: Not on file  Relationships  . Social [place] phone: Not on file    Gets together: Not on file    Attends religious service: Not on file    Active member of club or organization: Not on file    Attends meetings of clubs or organizations: Not on file    Relationship status: Not on file  Other Topics Concern  . Not on file  Social History Narrative   Married. Lives  with wife. 2 children- daughter Freight forwarder (2 daughters one will be a Restaurant manager, fast food), daughter Lodema Hong and never came back home- still at [employer] (1 grandson) so 3 grandkids total.        Fought in [place] war- wife was also in the war and veteran.       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     Outpatient Encounter Medications as of 10/28/2019  Medication Sig  . acetaminophen (TYLENOL) 500 MG tablet Take 1 tablet (500 mg total) by mouth every 6 (six) hours as needed for mild pain or moderate pain (or Fever >/= 101).  [name] allopurinol (ZYLOPRIM) 100 MG tablet Take 100 mg by mouth 2 (two) times daily.   . Cholecalciferol (VITAMIN D3) 250 MCG (10000 UT) TABS Take 10,000 Units by mouth daily.  . colchicine 0.6 MG tablet Take 0.6 mg by mouth as needed.  . finasteride (PROSCAR) 5 MG tablet Take 5 mg by mouth daily.  [name] omeprazole (PRILOSEC) 20 MG capsule Take 20 mg by mouth daily as needed.   . simvastatin (ZOCOR) 40 MG  tablet Take 20 mg by mouth at bedtime.   . vitamin B-12 (CYANOCOBALAMIN) 500 MCG tablet Take 500 mcg by mouth daily.   No facility-administered encounter medications on file as of 10/28/2019.     Activities of Daily Living In your present state of health, do you have any difficulty performing the following activities: 10/28/2019 05/16/2019  Hearing? [place]? N -  Difficulty concentrating or making decisions? N -  Walking or climbing stairs? Y -  Dressing or bathing? N -  Doing errands, shopping? N N  Preparing Food and eating ? N -  Using the Toilet? N -  In the past six months, have you accidently leaked urine? N -  Do you have problems with loss of bowel control? N -  Managing your Medications? N -  Managing your Finances? N -  Housekeeping or managing your Housekeeping? N -  Some recent data might be hidden    Patient Care Team: Marin Olp, MD as PCP - General (Family Medicine) Clinic, Thayer Dallas as Consulting Physician Jarome Matin, MD as Consulting Physician (Dermatology)   Assessment:   This is a routine wellness examination for [place].  Exercise Activities and Dietary recommendations Current Exercise Habits: The patient does not participate in regular exercise at present  Goals    . patient     Stay Vertical  Stay active; exercise will help if strength building  Will take interval breaks working in the yard or doing something otherwise strenuous   May try tai chi        . Patient Stated     To be as health next year Keep up the balance exercises        Fall Risk Fall Risk  10/28/2019 05/21/2019 04/18/2018 03/18/2018 03/14/2017  Falls in the past year? 1 1 Yes Yes Yes  Comment - - no more falls - in the yard getting limbs up in the yard   Number falls in past yr: 1 1 - 2 or more 2 or more  Injury with Fall? 1 1 - No -  Risk Factor Category  - - - High Fall Risk -  Risk for fall due to : Impaired mobility;History of fall(s) Impaired  balance/gait;Impaired mobility;History of fall(s) - - -  Follow up Falls evaluation completed;Education provided;Falls prevention discussed - - - Education provided   Is  the patient's home free of loose throw rugs in walkways, pet beds, electrical cords, etc?   yes      Grab bars in the bathroom? yes      Handrails on the stairs?   yes      Adequate lighting?   yes  Timed Get Up and Go Performed: completed and within normal timeframe; no gait abnormalities noted   Depression Screen PHQ 2/9 Scores 10/28/2019 10/28/2019 04/28/2019 04/18/2018  PHQ - 2 Score 1 0 0 0  PHQ- 9 Score 5 - - -    Cognitive Function- no cognitive concerns at this time  MMSE - Mini Mental State Exam 04/18/2018 03/14/2017  Not completed: (No Data) (No Data)  Cognitive Testing  Alert? Yes         Normal Appearance? Yes  Oriented to person? Yes           Place? Yes  Time? Yes  Recall of three objects? Yes  Can perform simple calculations? Yes  Displays appropriate judgment? Yes  Can read the correct time from a watch face? Yes   Immunization History  Administered Date(s) Administered  . Influenza, High Dose Seasonal PF 09/04/2017, 09/12/2018  . Influenza,inj,Quad PF,6+ Mos 09/08/2016  . Pneumococcal Conjugate-13 01/23/2014  . Pneumococcal-Unspecified 08/20/2000  . Td 04/30/2017  . Tdap 11/21/2010    Qualifies for Shingles Vaccine? completed and within normal timeframe; no gait abnormalities noted    Screening Tests Health Maintenance  Topic Date Due  . INFLUENZA VACCINE  06/21/2019  . TETANUS/TDAP  05/01/2027  . PNA vac Low Risk Adult  Completed   Cancer Screenings: Lung: Low Dose CT Chest recommended if Age 24-80 years, 30 pack-year currently smoking OR have quit w/in 15years. Patient does not qualify. Colorectal: No longer indicated      Plan:  I have personally reviewed and addressed the Medicare Annual Wellness questionnaire and have noted the following in the patient's chart:  A. Medical and  social history B. Use of alcohol, tobacco or illicit drugs  C. Current medications and supplements D. Functional ability and status E.  Nutritional status F.  Physical activity G. Advance directives H. List of other physicians I.  Hospitalizations, surgeries, and ER visits in previous 12 months J.  [place] such as hearing and vision if needed, cognitive and depression L. Referrals, records requested, and appointments- will request records from flu shot administration   In addition, I have reviewed and discussed with patient certain preventive protocols, quality metrics, and best practice recommendations. A written personalized care plan for preventive services as well as general preventive health recommendations were provided to patient.   Signed,  Denman George, LPN  Nurse Health Advisor   Nurse Notes: no additional   I have reviewed and agree with note, evaluation, plan.  See my separate physical note from today  Garret Reddish, MD

## 2019-12-11 LAB — CBC AND DIFFERENTIAL
HCT: 43 (ref 41–53)
Hemoglobin: 14 (ref 13.5–17.5)
Platelets: 149 — AB (ref 150–399)
WBC: 6.6

## 2019-12-11 LAB — HEPATIC FUNCTION PANEL
ALT: 23 (ref 10–40)
AST: 18 (ref 14–40)
Alkaline Phosphatase: 101 (ref 25–125)
Bilirubin, Direct: 0.1 (ref 0.01–0.4)

## 2019-12-11 LAB — LIPID PANEL
Cholesterol: 151 (ref 0–200)
HDL: 46 (ref 35–70)
LDL Cholesterol: 88
Triglycerides: 84 (ref 40–160)

## 2019-12-11 LAB — BASIC METABOLIC PANEL
BUN: 28 — AB (ref 4–21)
Creatinine: 1 (ref 0.6–1.3)
Glucose: 98

## 2019-12-11 LAB — CBC: RBC: 4.6 (ref 3.87–5.11)

## 2019-12-11 LAB — COMPREHENSIVE METABOLIC PANEL
Albumin: 3.7 (ref 3.5–5.0)
Calcium: 8.5 — AB (ref 8.7–10.7)

## 2019-12-11 LAB — HEMOGLOBIN A1C: Hemoglobin A1C: 6.1

## 2019-12-30 DIAGNOSIS — H524 Presbyopia: Secondary | ICD-10-CM | POA: Diagnosis not present

## 2019-12-30 DIAGNOSIS — H5203 Hypermetropia, bilateral: Secondary | ICD-10-CM | POA: Diagnosis not present

## 2019-12-30 DIAGNOSIS — H10413 Chronic giant papillary conjunctivitis, bilateral: Secondary | ICD-10-CM | POA: Diagnosis not present

## 2019-12-30 DIAGNOSIS — Z961 Presence of intraocular lens: Secondary | ICD-10-CM | POA: Diagnosis not present

## 2020-05-03 ENCOUNTER — Ambulatory Visit (INDEPENDENT_AMBULATORY_CARE_PROVIDER_SITE_OTHER): Payer: Medicare Other | Admitting: Family Medicine

## 2020-05-03 ENCOUNTER — Encounter: Payer: Self-pay | Admitting: Family Medicine

## 2020-05-03 ENCOUNTER — Other Ambulatory Visit: Payer: Self-pay

## 2020-05-03 VITALS — BP 118/60 | HR 61 | Temp 98.6°F | Ht 70.0 in | Wt 168.4 lb

## 2020-05-03 DIAGNOSIS — G629 Polyneuropathy, unspecified: Secondary | ICD-10-CM

## 2020-05-03 DIAGNOSIS — N401 Enlarged prostate with lower urinary tract symptoms: Secondary | ICD-10-CM

## 2020-05-03 DIAGNOSIS — R739 Hyperglycemia, unspecified: Secondary | ICD-10-CM

## 2020-05-03 DIAGNOSIS — E785 Hyperlipidemia, unspecified: Secondary | ICD-10-CM

## 2020-05-03 DIAGNOSIS — D692 Other nonthrombocytopenic purpura: Secondary | ICD-10-CM | POA: Insufficient documentation

## 2020-05-03 DIAGNOSIS — R2689 Other abnormalities of gait and mobility: Secondary | ICD-10-CM | POA: Diagnosis not present

## 2020-05-03 DIAGNOSIS — I700 Atherosclerosis of aorta: Secondary | ICD-10-CM | POA: Diagnosis not present

## 2020-05-03 DIAGNOSIS — R351 Nocturia: Secondary | ICD-10-CM | POA: Diagnosis not present

## 2020-05-03 DIAGNOSIS — M1A0790 Idiopathic chronic gout, unspecified ankle and foot, without tophus (tophi): Secondary | ICD-10-CM | POA: Diagnosis not present

## 2020-05-03 NOTE — Progress Notes (Signed)
Phone [phone] In person visit   Subjective:   Allen Robinson is a 86 y.o. year old very pleasant male patient who presents for/with See problem oriented charting Chief Complaint  Patient presents with  . Gout  . Hyperlipidemia  . Benign Prostatic Hypertrophy   This visit occurred during the SARS-CoV-2 public health emergency.  Safety protocols were in place, including screening questions prior to the visit, additional usage of staff PPE, and extensive cleaning of exam room while observing appropriate contact time as indicated for disinfecting solutions.   Past Medical History-  Patient Active Problem List   Diagnosis Date Noted  . Gout 03/14/2017    Priority: Medium  . BPH associated with nocturia 03/14/2017    Priority: Medium  . Hyperlipidemia 03/14/2017    Priority: Medium  . GERD (gastroesophageal reflux disease) 03/14/2017    Priority: Medium  . Hyperglycemia 04/28/2019    Priority: Low  . Vitamin D deficiency 03/14/2017    Priority: Low  . Peripheral polyneuropathy 02/16/2016    Priority: Low  . Diplopia 11/19/2015    Priority: Low  . Senile purpura ([place]) 05/03/2020  . Aortic atherosclerosis ([place]) 05/21/2019  . Fall at home, initial encounter 05/15/2019  . Right rib fracture 05/15/2019  . Insect bite of left ankle 04/30/2017    Medications- reviewed and updated Current Outpatient Medications  Medication Sig Dispense Refill  . acetaminophen (TYLENOL) 500 MG tablet Take 1 tablet (500 mg total) by mouth every 6 (six) hours as needed for mild pain or moderate pain (or Fever >/= 101).    [name] allopurinol (ZYLOPRIM) 100 MG tablet Take 100 mg by mouth 2 (two) times daily.     . colchicine 0.6 MG tablet Take 0.6 mg by mouth as needed.    . finasteride (PROSCAR) 5 MG tablet Take 5 mg by mouth daily.    [name] omeprazole (PRILOSEC) 20 MG capsule Take 20 mg by mouth daily as needed.     . simvastatin (ZOCOR) 40 MG tablet Take 20 mg by mouth at bedtime.      No  current facility-administered medications for this visit.     Objective:  BP 118/60   Pulse 61   Temp 98.6 F (37 C) (Temporal)   Ht 5\' 10"  (1.778 m)   Wt 168 lb 6.4 oz (76.4 kg)   SpO2 97%   BMI 24.16 kg/m  Gen: NAD, resting comfortably CV: RRR no murmurs rubs or gallops Lungs: CTAB no crackles, wheeze, rhonchi Ext: trace edema Skin: warm, dry    Assessment and Plan   #Gout S: 0 flares on allopurinol 100 mg twice a day. Has been [facility] at [facility] sooner.  Lab Results  Component Value Date   LABURIC 5.2 03/18/2018  A/P:Stable. Continue current medications.    #hyperlipidemia/aortic atherosclerosis-incidental finding on prior imaging S: Medication:compliant with simvastatin 40 mg. Lab Results  Component Value Date   CHOL 151 12/11/2019   HDL 46 12/11/2019   LDLCALC 88 12/11/2019   TRIG 84 12/11/2019   A/P: Mild poor control given newer guidelines plus aortic atherosclerosis.  Given he has prediabetes we opted to hold off on increasing medication at this time  # Balance issues/neuropathy S:Patient not using cane or walker. On occasion will use cane if unsteady.  Has set up railings on stairs at home. [facility] ordered gait and balance training PT program but he hasnt heard yet. More issues getting up out of chairs for instance.  -stairs are a big issue A/P: He  states his biggest obstacle at present is balance issues.  Recommended follow-up with physical therapy at the [facility] and if he has trouble getting in with them we can certainly set him up at our office -diagnosed as neuropathy in past- may present with proprioception issues  # BPH S:remains on finasteride 5 mg but still gets up to pee 2x a  times a night.  A/P:   Flomax not ideal as may increase fall risk- would consider if he gets to feeling more stable.   # GERD S:Sparing Prilosec 20 mg 3x a month B12 levels related to PPI use: Lab Results  Component Value Date   [id number] 004 03/18/2018  A/P: sparing use- I think risk  of low b12 is low but did ask him to see if [facility] can check this next visit.   -will see if [facility] can check b12 next visit     # Hyperglycemia/insulin resistance/prediabetes S:  Medication: none Exercise and diet- active in his home Lab Results  Component Value Date   HGBA1C 6.1 12/11/2019   HGBA1C 6.1 05/21/2019   HGBA1C 6.3 01/27/2019   A/P: he is now on [facility] with [facility]- will get a1c next visit  #Vitamin D deficiency S: Medication: none at present Last vitamin D Lab Results  Component Value Date   VD25OH 36.67 05/21/2019  A/P: hopefully stable- he will have this rechecked next [facility]   #Night sweats x1 in last few months- may have had too many blankets on- not a recurrent issue  Recommended follow up: Return in about 6 months (around 11/02/2020) for HLD, Balance, gout, BPH . or sooner if balance issues  Lab/Order associations:   ICD-10-CM   1. Chronic idiopathic gout involving toe without tophus, unspecified laterality  M1A.0790   2. Hyperlipidemia, unspecified hyperlipidemia type  E78.5   3. BPH associated with nocturia  N40.1    R35.1   4. Hyperglycemia  R73.9   5. Peripheral polyneuropathy  G62.9   6. Aortic atherosclerosis (HCC)  I70.0   7. Balance problem  R26.89   8. Senile purpura ([place])  D69.2    Return precautions advised.  Garret Reddish, MD

## 2020-05-03 NOTE — Patient Instructions (Addendum)
  Gout: continue medications. Glad you have not had any flair ups at this time.    Cholesterol: Not controlled very well at this time. Given age and Pre- Diabetes we do not want to increase medications. Work on diet and life style changes to help lower it.   Balance: Try to use cain as much as you can. If you are not able to get gait and balance training with the [facility] let our office know we can get you set up with that in our office. I think this will be very helpful for you.   Try to do up downs like we showed you in the office today to help with strength training. Do not do more than 10 at a time.   BPH: We do have a medication that can help with you getting up a lot to urinate at night. This may make you a little light headed after we get you a little stronger we may add that.   Next time you have labs at [facility] please ask them to check your Vitamin B-12 and A1C. The diagnosis for b-12 should be high risk medication use and A1c will be hyperglycemia. And Uric Acid under gout.   If you have any questions or concerns before your next scheduled appointment please give our office a call.   The Aging Gracefully Program is helping local older adults age safely in place by implementing a version of [employer], an evidence-based, multidisciplinary intervention to increase older adult's mobility, functional independence, and quality of life.  Participants in the Aging Gracefully Program identify current barriers in their everyday life that are prohibiting their safety and independence in their own home.  Participants work with their occupational therapist and registered nurse to identify strategies and home modifications that will improve their safety and independence with these tasks, allowing them to age in place. Home repairs recommended by the occupational therapist are completed by [employer].   The Aging Gracefully Program not only educates participants on  how to improve their safety and independence in their home.  Even more, it empowers each participant to become a problem solver and critical thinker.  While working with occupational therapists and registered nurses, participants are taught to utilize the skills learned while working towards their individualized goals and apply these concepts to other areas of their lives.  The improvements in their safety and independence with everyday tasks are life changing for our participants.  However, the empowerment our participants develop to make positive changes in their lives for years to come is the greatest reward of all.  Do you have a patient that may benefit from the Aging Gracefully Program?  Participants must be 67 years of age or older, live in [place], have limited financial means, and own their own home.  There may be a minimal financial responsibility based on your patient's income level.  Please have your patient contact [employer] at [phone] for more details regarding enrollment in the [place].   Recommended follow up: Return in about 6 months (around 11/02/2020) for HLD, Balance, gout, BPH .

## 2020-05-03 NOTE — Assessment & Plan Note (Signed)
Easy bruising noted. Will monitor. Slightly low platelets in January- will follow.

## 2020-05-09 IMAGING — CT CT CHEST WITH CONTRAST
2 of 5 series · 13 of 46 positions shown, 15 images · IV contrast (omnipaque)
Comparison: No comparison studies available.

CLINICAL DATA: Status post fall from standing yesterday with right
side pain.

EXAM:
CT CHEST, ABDOMEN, AND PELVIS WITH CONTRAST
TECHNIQUE: Multidetector CT imaging of the chest, abdomen and pelvis was
performed following the standard protocol during bolus
administration of intravenous contrast.
CONTRAST:  100mL OMNIPAQUE IOHEXOL 300 MG/ML  SOLN

[Series 3: cap with · axial · 0.84mm/px · z∈[+736,+1291]mm · 10 of 133 slices shown, 12 images]
[im 11/133  soft-tissue]
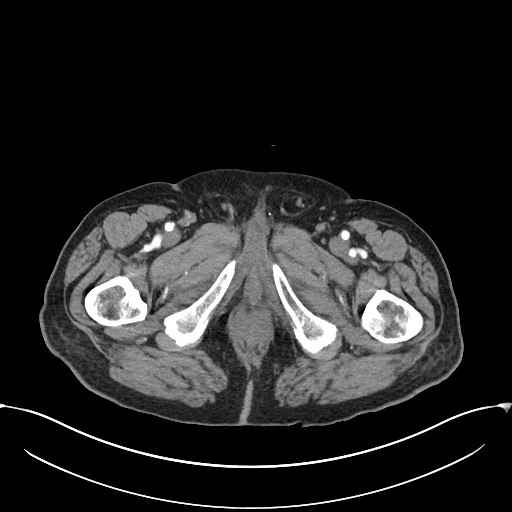
[im 11/133  bone]
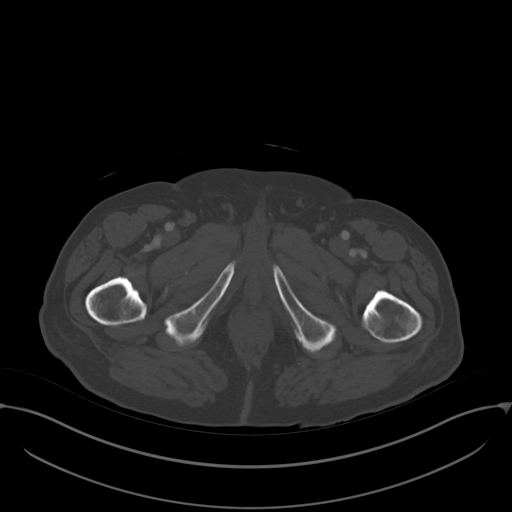
[im 21/133  soft-tissue]
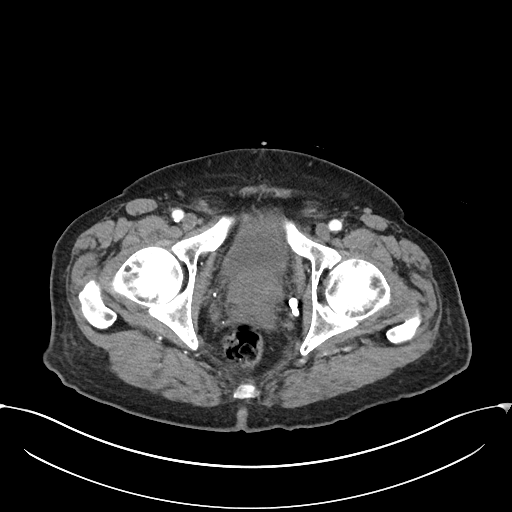
[im 41/133  soft-tissue]
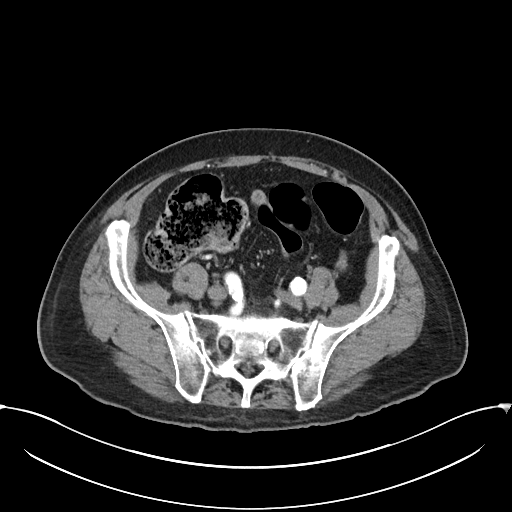
[im 51/133  soft-tissue]
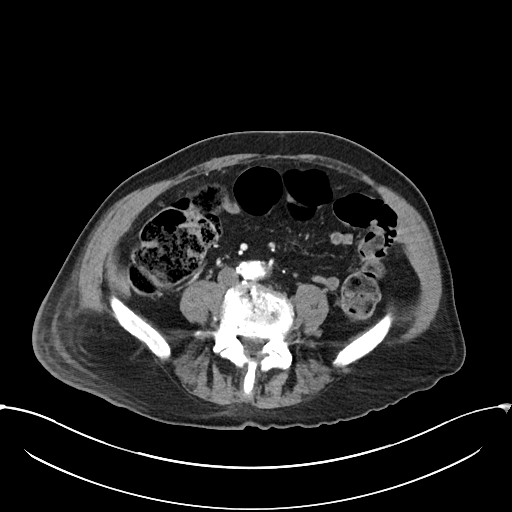
[im 61/133  soft-tissue]
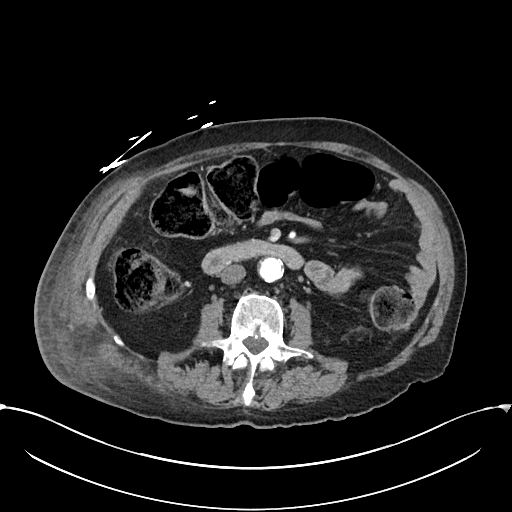
[im 72/133  soft-tissue]
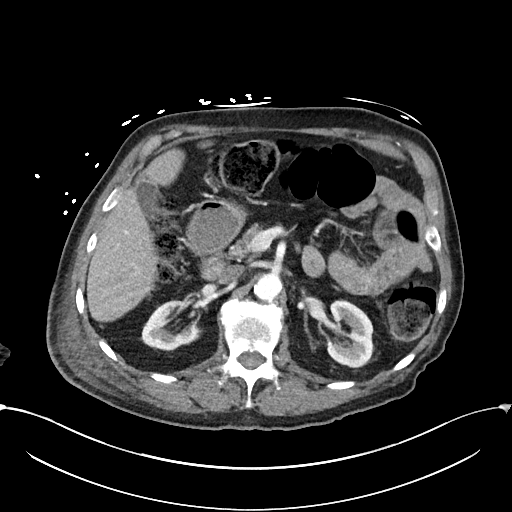
[im 82/133  soft-tissue]
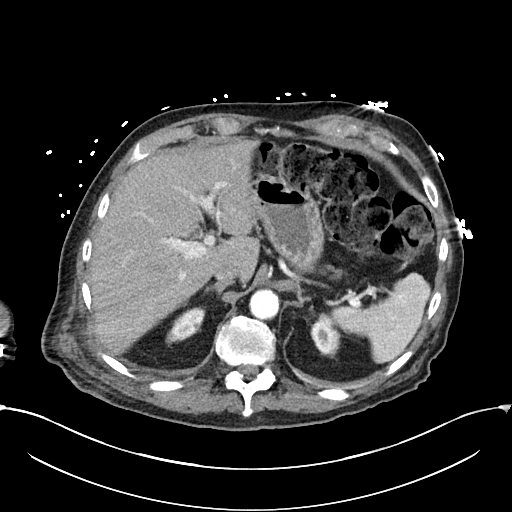
[im 102/133  soft-tissue]
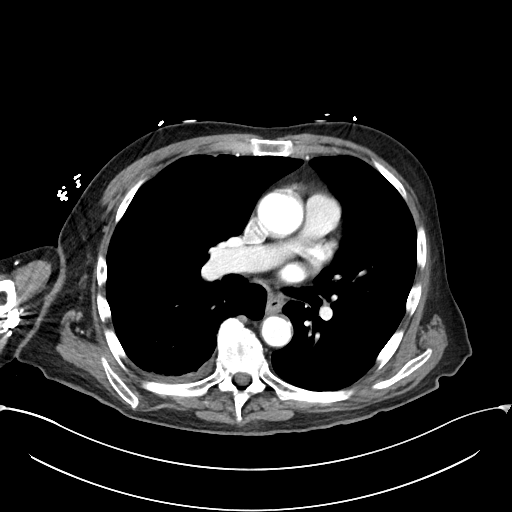
[im 112/133  soft-tissue]
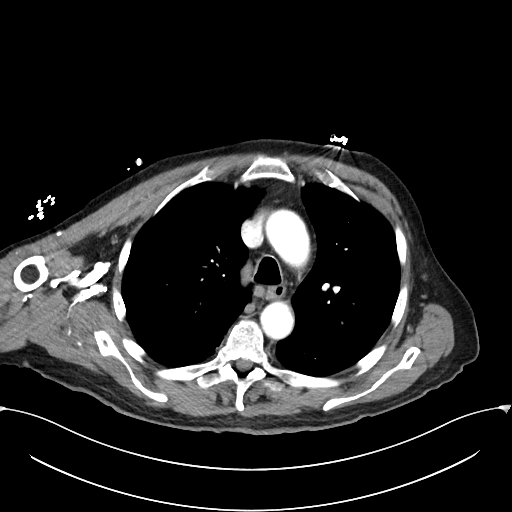
[im 112/133  bone]
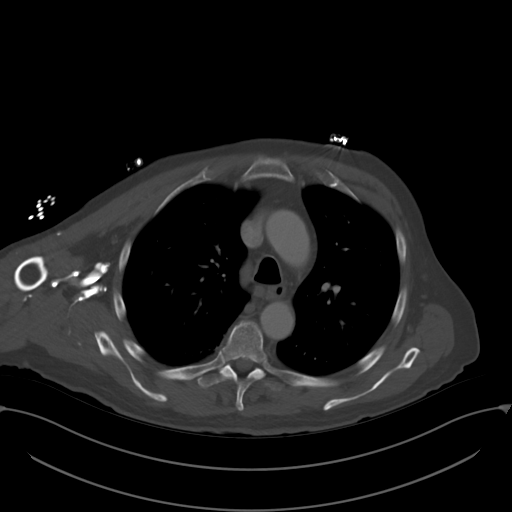
[im 122/133  soft-tissue]
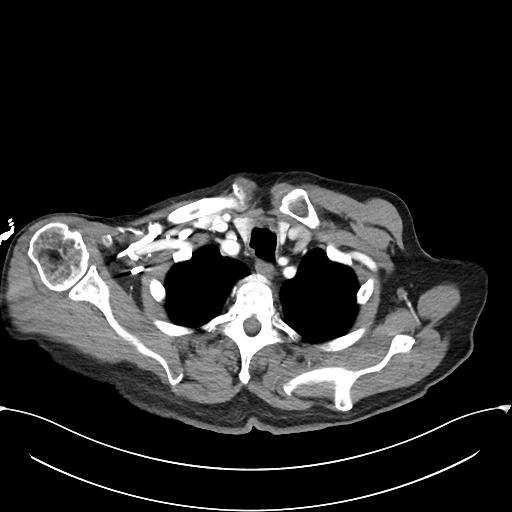

[Series 6: cor · coronal · 0.77mm/px · 3 of 107 slices shown]
[im 36/107  soft-tissue]
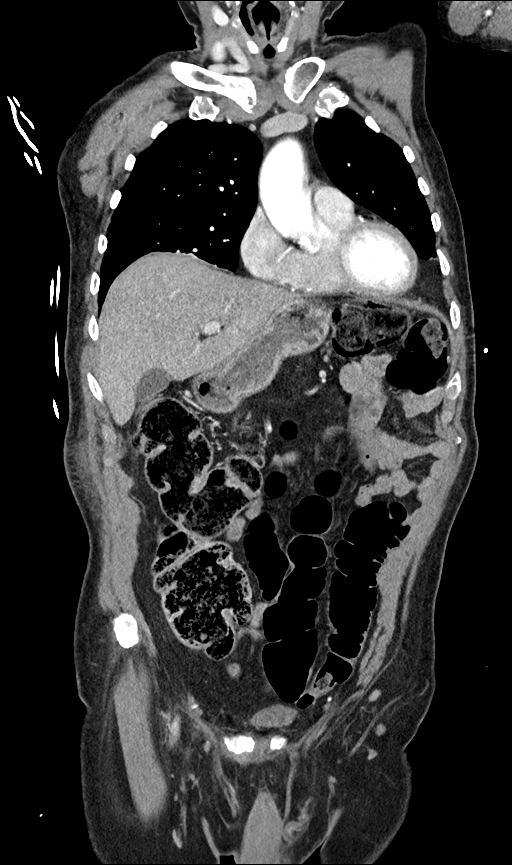
[im 48/107  soft-tissue]
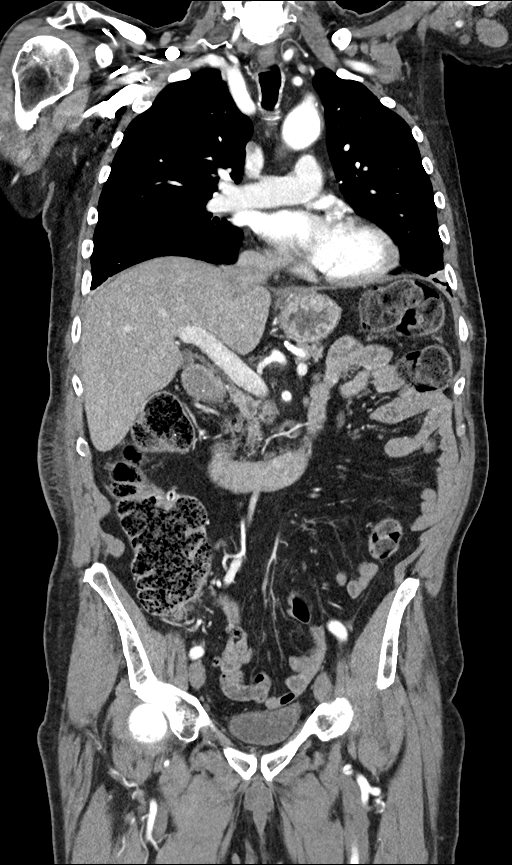
[im 59/107  soft-tissue]
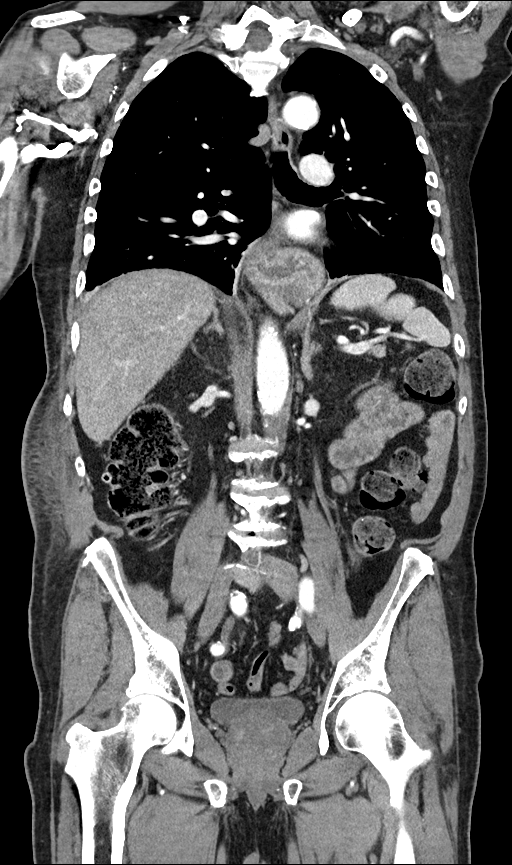

[13 of 46 positions shown; findings below may reference images not displayed]

FINDINGS: CT CHEST FINDINGS

Cardiovascular: The heart size is normal. No substantial pericardial
effusion. Coronary artery calcification is evident. Atherosclerotic
calcification is noted in the wall of the thoracic aorta.

Mediastinum/Nodes: No mediastinal lymphadenopathy. There is no hilar
lymphadenopathy. The esophagus has normal imaging features. Small to
moderate hiatal hernia noted. There is no axillary lymphadenopathy.

Lungs/Pleura: No evidence for pneumothorax. Tiny right pleural
effusion noted. There is atelectasis in the posterior lung bases
bilaterally. No suspicious pulmonary nodule or mass.

Musculoskeletal: Acute fractures identified posterolateral right
ninth rib and there is a segmental fracture of the posterior right
tenth rib. No evidence for thoracic spine fracture. No left rib
fracture evident.

CT ABDOMEN PELVIS FINDINGS

Hepatobiliary: 7 mm hypoattenuating lesion in the medial segment
left liver is too small to characterize but likely benign. Liver
otherwise unremarkable. There is no evidence for gallstones,
gallbladder wall thickening, or pericholecystic fluid. No
intrahepatic or extrahepatic biliary dilation.

Pancreas: No focal mass lesion. No dilatation of the main duct. No
intraparenchymal cyst. No peripancreatic edema.

Spleen: No splenomegaly. No focal mass lesion.

Adrenals/Urinary Tract: No adrenal nodule or mass. Kidneys
unremarkable. Right kidney demonstrates duplicated intrarenal
collecting system with at least partial duplication of the right
ureter. The urinary bladder appears normal for the degree of
distention.

Stomach/Bowel: Small to moderate hiatal hernia. Stomach otherwise
unremarkable. Duodenum is normally positioned as is the ligament of
[place]. No small bowel wall thickening. No small bowel dilatation.
The terminal ileum is normal. The appendix is normal. No gross
colonic mass. No colonic wall thickening. Diverticular changes are
noted in the right colon without diverticulitis. Prominent stool
volume evident.

Vascular/Lymphatic: There is abdominal aortic atherosclerosis
without aneurysm. There is no gastrohepatic or hepatoduodenal
ligament lymphadenopathy. No intraperitoneal or retroperitoneal
lymphadenopathy. Left common iliac artery measures up to 1.7 cm
diameter. No pelvic sidewall lymphadenopathy.

Reproductive: Prostate gland is enlarged.

Other: No intraperitoneal free fluid.

Musculoskeletal: No evidence for an acute fracture in the bony
pelvis. No evidence for lumbar spine fracture. Degenerative disc
disease noted at lower lumbar levels.
IMPRESSION: 1. Acute fracture of the posterolateral right ninth rib and a
segmental fracture of the posterior right tenth rib fifth which
shows about 1 shaft with of inferior displacement of the segmental
fragment on coronal images (see images 87 and 88 of series [DATE]. No pneumothorax but there is a tiny right pleural effusion.
Atelectasis noted in the posterior lower lobes bilaterally.
3. No other acute traumatic abnormality identified in the chest,
abdomen, or pelvis.
4. Small to moderate hiatal hernia.
5. Prostatomegaly.
6.  Aortic Atherosclerois ([id number])

## 2020-07-14 DIAGNOSIS — L821 Other seborrheic keratosis: Secondary | ICD-10-CM | POA: Diagnosis not present

## 2020-07-14 DIAGNOSIS — Z85828 Personal history of other malignant neoplasm of skin: Secondary | ICD-10-CM | POA: Diagnosis not present

## 2020-07-14 DIAGNOSIS — L720 Epidermal cyst: Secondary | ICD-10-CM | POA: Diagnosis not present

## 2020-07-14 DIAGNOSIS — L812 Freckles: Secondary | ICD-10-CM | POA: Diagnosis not present

## 2020-07-14 DIAGNOSIS — D692 Other nonthrombocytopenic purpura: Secondary | ICD-10-CM | POA: Diagnosis not present

## 2020-09-28 DIAGNOSIS — Z7184 Encounter for health counseling related to travel: Secondary | ICD-10-CM | POA: Diagnosis not present

## 2020-11-01 NOTE — Progress Notes (Signed)
Phone [phone] In person visit   Subjective:   Allen Robinson is a 86 y.o. year old very pleasant male patient who presents for/with See problem oriented charting Chief Complaint  Patient presents with  . Gastroesophageal Reflux  . Hyperlipidemia   This visit occurred during the SARS-CoV-2 public health emergency.  Safety protocols were in place, including screening questions prior to the visit, additional usage of staff PPE, and extensive cleaning of exam room while observing appropriate contact time as indicated for disinfecting solutions.   Past Medical History-  Patient Active Problem List   Diagnosis Date Noted  . Gout 03/14/2017    Priority: Medium  . BPH associated with nocturia 03/14/2017    Priority: Medium  . Hyperlipidemia 03/14/2017    Priority: Medium  . GERD (gastroesophageal reflux disease) 03/14/2017    Priority: Medium  . Hyperglycemia 04/28/2019    Priority: Low  . Vitamin D deficiency 03/14/2017    Priority: Low  . Peripheral polyneuropathy 02/16/2016    Priority: Low  . Diplopia 11/19/2015    Priority: Low  . Senile purpura ([place]) 05/03/2020  . Aortic atherosclerosis ([place]) 05/21/2019  . Fall at home, initial encounter 05/15/2019  . Right rib fracture 05/15/2019  . Insect bite of left ankle 04/30/2017    Medications- reviewed and updated Current Outpatient Medications  Medication Sig Dispense Refill  . acetaminophen (TYLENOL) 500 MG tablet Take 1 tablet (500 mg total) by mouth every 6 (six) hours as needed for mild pain or moderate pain (or Fever >/= 101).    [name] allopurinol (ZYLOPRIM) 100 MG tablet Take 100 mg by mouth 2 (two) times daily.     . colchicine 0.6 MG tablet Take 0.6 mg by mouth as needed.    . finasteride (PROSCAR) 5 MG tablet Take 5 mg by mouth daily.    [name] omeprazole (PRILOSEC) 20 MG capsule Take 20 mg by mouth daily as needed.     . simvastatin (ZOCOR) 40 MG tablet Take 20 mg by mouth at bedtime.      No current  facility-administered medications for this visit.     Objective:  BP 119/65   Pulse (!) 57   Temp 97.9 F (36.6 C) (Temporal)   Resp 18   Ht 5\' 10"  (1.778 m)   Wt 169 lb (76.7 kg)   SpO2 99%   BMI 24.25 kg/m  Gen: NAD, resting comfortably CV: RRR no murmurs rubs or gallops Lungs: CTAB no crackles, wheeze, rhonchi Ext: no edema Skin: warm, dry     Assessment and Plan   #balance issues- reported at last visit in June- we had discussed [facility] or private referral if needed. Proprioception issues with neuropathy contribute. Railings on stairs at home really important- could not go up safely without now. Has cane available and had recommended regular use. Feels like slightly worse. Also feels like has more stamina issues compared to baseline - [facility] and [facility] not see improvement - also had a scan to look for parkinsons that was negative. He is reporting PET but wonder about DAT. I think he is referencing DAT scan 06/21/18.  -willing to retry PT at our office  # constipation- trying to eat a lot of salads and get good fiber intake. Tries colace if needed. Miralax if needed- discussed could continue this   # nocturia- gets up 3x a night to urinate. Already on finasteride. I worry about adding flomax with orthostatic issues. Discussed trial double voiding  #  GERD S:Medication: Omeperazole 20mg  perhaps 3x a month B12 levels related to PPI use: Lab Results  Component Value Date   [id number] 03/18/2018  A/P:  reflux controlled with sparing use of PPI. Asked him to see if [facility] could check b12 last visit- he has labs in march and they will trial  #hyperlipidemia/aortic atherosclerosis- incidental finding on prior imaging S: Medication: simvastatin 40Mg  Lab Results  Component Value Date   CHOL 151 12/11/2019   HDL 46 12/11/2019   LDLCALC 88 12/11/2019   TRIG 84 12/11/2019   A/P: LDL above goal of 70 or less on last check. With prediabetes we have opted to hold off on  strengthening statin though. Continue efforts for healthy eating with exercise limited by balance issues- could consider stationary bike  # Hyperglycemia/insulin resistance/prediabetes S:  Medication:  none Exercise and diet- tries to be active in his home Lab Results  Component Value Date   HGBA1C 6.1 12/11/2019   HGBA1C 6.1 05/21/2019   HGBA1C 6.3 01/27/2019    A/P: hopefully controlled- hoping [facility] will update a1c with next labs- see avs  #Gout S: 0 flares in last 6 months on Allopurinol 100Mg  in [place]. Colchicine available Lab Results  Component Value Date   LABURIC 5.2 03/18/2018  A/P: Stable. Continue current medications.   #Vitamin D deficiency S: Medication:  none at present other than in his multivitamin Last vitamin D Lab Results  Component Value Date   VD25OH 36.67 05/21/2019  A/P:  hopefully stable- have encouraged checks at [facility]  Recommended follow up: Return in about 6 months (around 05/03/2021) for follow up- or sooner if needed.  Lab/Order associations:   ICD-10-CM   1. Balance disorder  R26.89 Ambulatory referral to Physical Therapy  2. Hyperlipidemia, unspecified hyperlipidemia type  E78.5   3. BPH associated with nocturia  N40.1    R35.1   4. Chronic idiopathic gout involving toe without tophus, unspecified laterality  M1A.0790   5. Aortic atherosclerosis (HCC)  I70.0   6. Hyperglycemia  R73.9   7. Vitamin D deficiency  E55.9   8. Peripheral polyneuropathy  G62.9 Ambulatory referral to Physical Therapy  9. Gastroesophageal reflux disease without esophagitis  K21.9    No orders of the defined types were placed in this encounter.  Return precautions advised.  Garret Reddish, MD

## 2020-11-02 ENCOUNTER — Other Ambulatory Visit: Payer: Self-pay

## 2020-11-02 ENCOUNTER — Ambulatory Visit (INDEPENDENT_AMBULATORY_CARE_PROVIDER_SITE_OTHER): Payer: Medicare Other | Admitting: Family Medicine

## 2020-11-02 ENCOUNTER — Encounter: Payer: Self-pay | Admitting: Family Medicine

## 2020-11-02 VITALS — BP 119/65 | HR 57 | Temp 97.9°F | Resp 18 | Ht 70.0 in | Wt 169.0 lb

## 2020-11-02 DIAGNOSIS — N401 Enlarged prostate with lower urinary tract symptoms: Secondary | ICD-10-CM | POA: Diagnosis not present

## 2020-11-02 DIAGNOSIS — G629 Polyneuropathy, unspecified: Secondary | ICD-10-CM | POA: Diagnosis not present

## 2020-11-02 DIAGNOSIS — M1A0790 Idiopathic chronic gout, unspecified ankle and foot, without tophus (tophi): Secondary | ICD-10-CM | POA: Diagnosis not present

## 2020-11-02 DIAGNOSIS — R739 Hyperglycemia, unspecified: Secondary | ICD-10-CM

## 2020-11-02 DIAGNOSIS — E785 Hyperlipidemia, unspecified: Secondary | ICD-10-CM | POA: Diagnosis not present

## 2020-11-02 DIAGNOSIS — K219 Gastro-esophageal reflux disease without esophagitis: Secondary | ICD-10-CM

## 2020-11-02 DIAGNOSIS — R2689 Other abnormalities of gait and mobility: Secondary | ICD-10-CM

## 2020-11-02 DIAGNOSIS — R351 Nocturia: Secondary | ICD-10-CM

## 2020-11-02 DIAGNOSIS — E559 Vitamin D deficiency, unspecified: Secondary | ICD-10-CM | POA: Diagnosis not present

## 2020-11-02 DIAGNOSIS — I700 Atherosclerosis of aorta: Secondary | ICD-10-CM | POA: Diagnosis not present

## 2020-11-02 NOTE — Patient Instructions (Addendum)
Please consider scheduling a wellness visit with our nurse specialist Otila Kluver  Schedule a visit with Dr. Kayleen Memos of physical therapy before you leave  I would love for [facility] to check b12 (with reflux medicine use), cbc, cmp, lipid panel, a1c (for prediabetes), uric acid (for gout), vitamin D (vitamin D deficiency) and please as always either drop off or send [place] a copy  Recommended follow up: Return in about 6 months (around 05/03/2021) for follow up- or sooner if needed. with me

## 2020-11-05 ENCOUNTER — Ambulatory Visit (INDEPENDENT_AMBULATORY_CARE_PROVIDER_SITE_OTHER): Payer: Medicare Other

## 2020-11-05 DIAGNOSIS — Z0000 Encounter for general adult medical examination without abnormal findings: Secondary | ICD-10-CM | POA: Diagnosis not present

## 2020-11-05 NOTE — Patient Instructions (Addendum)
Allen Robinson , Thank you for taking time to come for your Medicare Wellness Visit. I appreciate your ongoing commitment to your health goals. Please review the following plan we discussed and let me know if I can assist you in the future.   Screening recommendations/referrals: Colonoscopy: No longer required  Recommended yearly ophthalmology/optometry visit for glaucoma screening and checkup Recommended yearly dental visit for hygiene and checkup  Vaccinations: Influenza vaccine: Done 09/20/20 Up to date Pneumococcal vaccine: Up to date Tdap vaccine: Up to date Shingles vaccine: Shingrix discussed. Please contact your pharmacy for coverage information.    Covid-19: Completed 1/21, 2/25, & 09/20/20  Advanced directives: Please bring a copy of your health care power of attorney and living will to the office at your convenience.  Conditions/risks identified: Start PT and get balance corrected   Next appointment: Follow up in one year for your annual wellness visit.   Preventive Care 84 Years and Older, Male Preventive care refers to lifestyle choices and visits with your health care provider that can promote health and wellness. What does preventive care include?  A yearly physical exam. This is also called an annual well check.  Dental exams once or twice a year.  Routine eye exams. Ask your health care provider how often you should have your eyes checked.  Personal lifestyle choices, including:  Daily care of your teeth and gums.  Regular physical activity.  Eating a healthy diet.  Avoiding tobacco and drug use.  Limiting alcohol use.  Practicing safe sex.  Taking low doses of aspirin every day.  Taking vitamin and mineral supplements as recommended by your health care provider. What happens during an annual well check? The services and screenings done by your health care provider during your annual well check will depend on your age, overall health, lifestyle risk factors,  and family history of disease. Counseling  Your health care provider may ask you questions about your:  Alcohol use.  Tobacco use.  Drug use.  Emotional well-being.  Home and relationship well-being.  Sexual activity.  Eating habits.  History of falls.  Memory and ability to understand (cognition).  Work and work Statistician. Screening  You may have the following tests or measurements:  Height, weight, and BMI.  Blood pressure.  Lipid and cholesterol levels. These may be checked every 5 years, or more frequently if you are over 18 years old.  Skin check.  Lung cancer screening. You may have this screening every year starting at age 84 if you have a 30-pack-year history of smoking and currently smoke or have quit within the past 15 years.  Fecal occult blood test (FOBT) of the stool. You may have this test every year starting at age 84.  Flexible sigmoidoscopy or colonoscopy. You may have a sigmoidoscopy every 5 years or a colonoscopy every 10 years starting at age 18.  Prostate cancer screening. Recommendations will vary depending on your family history and other risks.  Hepatitis C blood test.  Hepatitis B blood test.  Sexually transmitted disease (STD) testing.  Diabetes screening. This is done by checking your blood sugar (glucose) after you have not eaten for a while (fasting). You may have this done every 1-3 years.  Abdominal aortic aneurysm (AAA) screening. You may need this if you are a current or former smoker.  Osteoporosis. You may be screened starting at age 84 if you are at high risk. Talk with your health care provider about your test results, treatment options, and if necessary,  the need for more tests. Vaccines  Your health care provider may recommend certain vaccines, such as:  Influenza vaccine. This is recommended every year.  Tetanus, diphtheria, and acellular pertussis (Tdap, Td) vaccine. You may need a Td booster every 10  years.  Zoster vaccine. You may need this after age 59.  Pneumococcal 13-valent conjugate (PCV13) vaccine. One dose is recommended after age 31.  Pneumococcal polysaccharide (PPSV23) vaccine. One dose is recommended after age 63. Talk to your health care provider about which screenings and vaccines you need and how often you need them. This information is not intended to replace advice given to you by your health care provider. Make sure you discuss any questions you have with your health care provider. Document Released: 12/03/2015 Document Revised: 07/26/2016 Document Reviewed: 09/07/2015 Elsevier Interactive Patient Education  2017 [place] Prevention in the Home Falls can cause injuries. They can happen to people of all ages. There are many things you can do to make your home safe and to help prevent falls. What can I do on the outside of my home?  Regularly fix the edges of walkways and driveways and fix any cracks.  Remove anything that might make you trip as you walk through a door, such as a raised step or threshold.  Trim any bushes or trees on the path to your home.  Use bright outdoor lighting.  Clear any walking paths of anything that might make someone trip, such as rocks or tools.  Regularly check to see if handrails are loose or broken. Make sure that both sides of any steps have handrails.  Any raised decks and porches should have guardrails on the edges.  Have any leaves, snow, or ice cleared regularly.  Use sand or salt on walking paths during winter.  Clean up any spills in your garage right away. This includes oil or grease spills. What can I do in the bathroom?  Use night lights.  Install grab bars by the toilet and in the tub and shower. Do not use towel bars as grab bars.  Use non-skid mats or decals in the tub or shower.  If you need to sit down in the shower, use a plastic, non-slip stool.  Keep the floor dry. Clean up any water that  spills on the floor as soon as it happens.  Remove soap buildup in the tub or shower regularly.  Attach bath mats securely with double-sided non-slip rug tape.  Do not have throw rugs and other things on the floor that can make you trip. What can I do in the bedroom?  Use night lights.  Make sure that you have a light by your bed that is easy to reach.  Do not use any sheets or blankets that are too big for your bed. They should not hang down onto the floor.  Have a firm chair that has side arms. You can use this for support while you get dressed.  Do not have throw rugs and other things on the floor that can make you trip. What can I do in the kitchen?  Clean up any spills right away.  Avoid walking on wet floors.  Keep items that you use a lot in easy-to-reach places.  If you need to reach something above you, use a strong step stool that has a grab bar.  Keep electrical cords out of the way.  Do not use floor polish or wax that makes floors slippery. If you must use  wax, use non-skid floor wax.  Do not have throw rugs and other things on the floor that can make you trip. What can I do with my stairs?  Do not leave any items on the stairs.  Make sure that there are handrails on both sides of the stairs and use them. Fix handrails that are broken or loose. Make sure that handrails are as long as the stairways.  Check any carpeting to make sure that it is firmly attached to the stairs. Fix any carpet that is loose or worn.  Avoid having throw rugs at the top or bottom of the stairs. If you do have throw rugs, attach them to the floor with carpet tape.  Make sure that you have a light switch at the top of the stairs and the bottom of the stairs. If you do not have them, ask someone to add them for you. What else can I do to help prevent falls?  Wear shoes that:  Do not have high heels.  Have rubber bottoms.  Are comfortable and fit you well.  Are closed at the  toe. Do not wear sandals.  If you use a stepladder:  Make sure that it is fully opened. Do not climb a closed stepladder.  Make sure that both sides of the stepladder are locked into place.  Ask someone to hold it for you, if possible.  Clearly mark and make sure that you can see:  Any grab bars or handrails.  First and last steps.  Where the edge of each step is.  Use tools that help you move around (mobility aids) if they are needed. These include:  Canes.  Walkers.  Scooters.  Crutches.  Turn on the lights when you go into a dark area. Replace any light bulbs as soon as they burn out.  Set up your furniture so you have a clear path. Avoid moving your furniture around.  If any of your floors are uneven, fix them.  If there are any pets around you, be aware of where they are.  Review your medicines with your doctor. Some medicines can make you feel dizzy. This can increase your chance of falling. Ask your doctor what other things that you can do to help prevent falls. This information is not intended to replace advice given to you by your health care provider. Make sure you discuss any questions you have with your health care provider. Document Released: 09/02/2009 Document Revised: 04/13/2016 Document Reviewed: 12/11/2014 Elsevier Interactive Patient Education  2017 [employer].

## 2020-11-05 NOTE — Progress Notes (Signed)
Virtual Visit via Telephone Note  I connected with  Allen Robinson on 11/05/20 at  9:30 AM EST by telephone and verified that I am speaking with the correct person using two identifiers.  Medicare Annual Wellness visit completed telephonically due to Covid-19 pandemic.   Persons participating in this call: This Health Coach and this patient.   Location: Patient: Home Provider: Office   I discussed the limitations, risks, security and privacy concerns of performing an evaluation and management service by telephone and the availability of in person appointments. The patient expressed understanding and agreed to proceed.  Unable to perform video visit due to video visit attempted and failed and/or patient does not have video capability.   Some vital signs may be absent or patient reported.   Willette Brace, LPN    Subjective:   Allen Robinson is a 86 y.o. male who presents for Medicare Annual/Subsequent preventive examination.  Review of Systems     Cardiac Risk Factors include: advanced age (>97mn, >>86women);male gender;dyslipidemia     Objective:    There were no vitals filed for this visit. There is no height or weight on file to calculate BMI.  Advanced Directives 11/05/2020 10/28/2019 04/18/2018 03/14/2017  Does Patient Have a Medical Advance Directive? Yes Yes Yes Yes  [place] of [place] Living [place];Healthcare Power of Attorney - -  Does patient want to make changes to medical advance directive? - No - Patient declined - -  Copy of [place]? No - copy requested No - copy requested - -    Current Medications (verified) Outpatient Encounter Medications as of 11/05/2020  Medication Sig   acetaminophen (TYLENOL) 500 MG tablet Take 1 tablet (500 mg total) by mouth every 6 (six) hours as needed for mild pain or moderate pain (or Fever >/= 101).   allopurinol (ZYLOPRIM) 100 MG tablet  Take 100 mg by mouth 2 (two) times daily.    finasteride (PROSCAR) 5 MG tablet Take 5 mg by mouth daily.   omeprazole (PRILOSEC) 20 MG capsule Take 20 mg by mouth daily as needed.    simvastatin (ZOCOR) 40 MG tablet Take 20 mg by mouth at bedtime.    colchicine 0.6 MG tablet Take 0.6 mg by mouth as needed. (Patient not taking: Reported on 11/05/2020)   No facility-administered encounter medications on file as of 11/05/2020.    Allergies (verified) Patient has no known allergies.   History: Past Medical History:  Diagnosis Date   Arthritis    hands   Chicken pox    Hyperlipidemia    Pneumonia    Seasonal allergies    Past Surgical History:  Procedure Laterality Date   TONSILLECTOMY     1936   Family History  Problem Relation Age of Onset   Heart failure Mother    Pneumonia Mother        died 942  Stroke Father        868  Neuropathy Neg Hx    Social History   Socioeconomic History   Marital status: Married    Spouse name: [name]   Number of children: 2   Years of education: 16   Highest education level: Not on file  Occupational History   Not on file  Tobacco Use   Smoking status: Never Smoker   Smokeless tobacco: Never Used  Substance and Sexual Activity   Alcohol use: Yes    Alcohol/week: 0.0  standard drinks    Comment: no longer drinking. prior social   Drug use: No   Sexual activity: Not on file  Other Topics Concern   Not on file  Social History Narrative   Married. Lives with wife. 2 children- daughter Freight forwarder (2 daughters one will be a Restaurant manager, fast food), daughter Lodema Hong and never came back home- still at [employer] (1 grandson) so 3 grandkids total.        Fought in [place] war- wife was also in the war and veteran.       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 Retired at 68 in 1994.    Social Determinants of Health   Financial Resource Strain: Low Risk    Difficulty of Paying Living Expenses: Not hard at all  Food Insecurity: No  Food Insecurity   Worried About Charity fundraiser in the Last Year: Never true   [place] in the Last Year: Never true  Transportation Needs: No Transportation Needs   Lack of Transportation (Medical): No   Lack of Transportation (Non-Medical): No  Physical Activity: Inactive   Days of Exercise per Week: 0 days   Minutes of Exercise per Session: 0 min  Stress: No Stress Concern Present   Feeling of Stress : Not at all  Social Connections: Moderately Integrated   Frequency of Communication with Friends and Family: Twice a week   Frequency of Social Gatherings with Friends and Family: More than three times a week   Attends Religious Services: More than 4 times per year   Active Member of [employer] or Organizations: No   Attends Music therapist: Never   Marital Status: Married    Tobacco Counseling Counseling given: Not Answered   Clinical Intake:  Pre-visit preparation completed: Yes  Pain : No/denies pain     BMI - recorded: 24.25 Nutritional Status: BMI of 19-24  Normal Nutritional Risks: None Diabetes: No  How often do you need to have someone help you when you read instructions, pamphlets, or other written materials from your doctor or pharmacy?: 1 - Never  Diabetic?No  Interpreter Needed?: No  Information entered by :: Charlott Rakes, LPN   Activities of Daily Living In your present state of health, do you have any difficulty performing the following activities: 11/05/2020 11/02/2020  Hearing? Y N  Comment wears hearing aids -  Vision? N N  Difficulty concentrating or making decisions? N N  Walking or climbing stairs? Y N  Comment balance issues -  Dressing or bathing? N N  Doing errands, shopping? N N  Preparing Food and eating ? N -  Using the Toilet? N -  In the past six months, have you accidently leaked urine? N -  Do you have problems with loss of bowel control? N -  Managing your Medications? N -  Managing your  Finances? N -  Housekeeping or managing your Housekeeping? N -  Some recent data might be hidden    Patient Care Team: Marin Olp, MD as PCP - General (Family Medicine) Clinic, Thayer Dallas as Consulting Physician Jarome Matin, MD as Consulting Physician (Dermatology)  Indicate any recent Medical Services you may have received from other than [facility] providers in the past year (date may be approximate).     Assessment:   This is a routine wellness examination for [place].  Hearing/Vision screen  Hearing Screening   '125Hz'  '250Hz'  '500Hz'  '1000Hz'  '2000Hz'  '3000Hz'  '4000Hz'  '6000Hz'  '8000Hz'   Right ear:  Left ear:           Comments: Wears hearing aids   Vision Screening Comments: Pt follows up with Dr Sharyn Dross for annual eye exams, [facility] opthalmology   Dietary issues and exercise activities discussed: Current Exercise Habits: The patient does not participate in regular exercise at present (yard work)  Goals     patient     Stay Vertical  Stay active; exercise will help if strength building  Will take interval breaks working in the yard or doing something otherwise strenuous   May try tai chi         Patient Stated     To be as health next year Keep up the balance exercises      Patient Stated     Stay upright and not lose balance      Depression Screen PHQ 2/9 Scores 11/05/2020 11/02/2020 10/28/2019 10/28/2019 04/28/2019 04/18/2018 03/18/2018  PHQ - 2 Score 0 0 1 0 0 0 0  PHQ- 9 Score - - 5 - - - -    Fall Risk Fall Risk  11/05/2020 05/03/2020 10/28/2019 05/21/2019 04/18/2018  Falls in the past year? 0 0 1 1 Yes  Comment - - - - no more falls  Number falls in past yr: 1 0 1 1 -  Injury with Fall? 1 0 1 1 -  Comment scrapes and bruises - - - -  Risk Factor Category  - - - - -  Risk for fall due to : History of fall(s);Impaired balance/gait;Impaired vision;Impaired mobility - Impaired mobility;History of fall(s) Impaired balance/gait;Impaired  mobility;History of fall(s) -  Follow up Falls prevention discussed - Falls evaluation completed;Education provided;Falls prevention discussed - -    FALL RISK PREVENTION PERTAINING TO THE HOME:  Any stairs in or around the home? Yes  If so, are there any without handrails? No  Home free of loose throw rugs in walkways, pet beds, electrical cords, etc? Yes  Adequate lighting in your home to reduce risk of falls? Yes   ASSISTIVE DEVICES UTILIZED TO PREVENT FALLS:  Life alert? Yes but states doesn't use  Use of a cane, walker or w/c? Yes  Grab bars in the bathroom? Yes  Shower chair or bench in shower? Yes  Elevated toilet seat or a handicapped toilet? No   TIMED UP AND GO:  Was the test performed? No .    Cognitive Function: MMSE - Mini Mental State Exam 04/18/2018 03/14/2017  Not completed: (No Data) (No Data)     6CIT Screen 11/05/2020  What Year? 0 points  What month? 0 points  Count back from 20 0 points  Months in reverse 0 points  Repeat phrase 0 points    Immunizations Immunization History  Administered Date(s) Administered   Fluad Quad(high Dose 65+) 09/20/2020   Influenza, High Dose Seasonal PF 09/04/2017, 09/12/2018   Influenza,inj,Quad PF,6+ Mos 09/08/2016   Moderna Sars-Covid-2 Vaccination 12/11/2019, 01/15/2020, 09/20/2020   Pneumococcal Conjugate-13 01/23/2014   Pneumococcal-Unspecified 08/20/2000   Td 04/30/2017   Tdap 11/21/2010    TDAP status: Up to date  Flu Vaccine status: Up to date  Done 09/20/20 Pneumococcal vaccine status: Up to date  Covid-19 vaccine status: Completed vaccines  Qualifies for Shingles Vaccine? Yes   Zostavax completed No   Shingrix Completed?: Yes  Screening Tests Health Maintenance  Topic Date Due   TETANUS/TDAP  05/01/2027   INFLUENZA VACCINE  Completed   COVID-19 Vaccine  Completed   PNA vac Low Risk  Adult  Completed    Health Maintenance  There are no preventive care reminders to display for  this patient.  Colorectal cancer screening: No longer required.    Additional Screening:  Vision Screening: Recommended annual ophthalmology exams for early detection of glaucoma and other disorders of the eye. Is the patient up to date with their annual eye exam?  Yes  Who is the provider or what is the name of the office in which the patient attends annual eye exams? Dr Maurie Boettcher   Dental Screening: Recommended annual dental exams for proper oral hygiene  Community Resource Referral / Chronic Care Management: CRR required this visit?  No   CCM required this visit?  No      Plan:     I have personally reviewed and noted the following in the patients chart:    Medical and social history  Use of alcohol, tobacco or illicit drugs   Current medications and supplements  Functional ability and status  Nutritional status  Physical activity  Advanced directives  List of other physicians  Hospitalizations, surgeries, and ER visits in previous 12 months  Vitals  Screenings to include cognitive, depression, and falls  Referrals and appointments  In addition, I have reviewed and discussed with patient certain preventive protocols, quality metrics, and best practice recommendations. A written personalized care plan for preventive services as well as general preventive health recommendations were provided to patient.     Willette Brace, LPN   90/30/1499   Nurse Notes: None

## 2020-11-10 ENCOUNTER — Encounter: Payer: Self-pay | Admitting: Physical Therapy

## 2020-11-10 ENCOUNTER — Other Ambulatory Visit: Payer: Self-pay

## 2020-11-10 ENCOUNTER — Ambulatory Visit (INDEPENDENT_AMBULATORY_CARE_PROVIDER_SITE_OTHER): Payer: Medicare Other | Admitting: Physical Therapy

## 2020-11-10 DIAGNOSIS — R2689 Other abnormalities of gait and mobility: Secondary | ICD-10-CM | POA: Diagnosis not present

## 2020-11-10 DIAGNOSIS — M6281 Muscle weakness (generalized): Secondary | ICD-10-CM

## 2020-11-17 ENCOUNTER — Other Ambulatory Visit: Payer: Self-pay

## 2020-11-17 ENCOUNTER — Ambulatory Visit (INDEPENDENT_AMBULATORY_CARE_PROVIDER_SITE_OTHER): Payer: Medicare Other | Admitting: Physical Therapy

## 2020-11-17 DIAGNOSIS — M6281 Muscle weakness (generalized): Secondary | ICD-10-CM | POA: Diagnosis not present

## 2020-11-17 DIAGNOSIS — R2689 Other abnormalities of gait and mobility: Secondary | ICD-10-CM | POA: Diagnosis not present

## 2020-11-22 ENCOUNTER — Encounter: Payer: Medicare Other | Admitting: Physical Therapy

## 2020-11-22 ENCOUNTER — Encounter: Payer: Self-pay | Admitting: Physical Therapy

## 2020-11-22 NOTE — Therapy (Signed)
[facility] [address] [place], [place], [address] Phone: [phone]   Fax:  [phone]  Physical Therapy Treatment  Patient Details  Name: Allen Robinson MRN: [id number] Date of Birth: 1926-09-24 Referring Provider (PT): Tana Conch   Encounter Date: 11/17/2020   PT End of Session - 11/22/20 0842    Visit Number 2    Number of Visits 12    Date for PT Re-Evaluation 12/22/20    Authorization Type Medicare    PT Start Time 0931    PT Stop Time 1014    PT Time Calculation (min) 43 min    Equipment Utilized During Treatment Gait belt    Activity Tolerance Patient tolerated treatment well    Behavior During Therapy [facility] for tasks assessed/performed           Past Medical History:  Diagnosis Date  . Arthritis    hands  . Chicken pox   . Hyperlipidemia   . Pneumonia   . Seasonal allergies     Past Surgical History:  Procedure Laterality Date  . TONSILLECTOMY     1936    There were no vitals filed for this visit.   Subjective Assessment - 11/22/20 0840    Subjective Pt had fall over weekend, was getting up from table, and tripped on chair leg. Family present was able to help him up, states no injuries.    Currently in Pain? No/denies                             [facility] Adult PT Treatment/Exercise - 11/22/20 0850      Ambulation/Gait   Gait Comments 35 ft x 6 with practice for safe direction changes and optimal use of SPC      Exercises   Exercises Knee/Hip      Knee/Hip Exercises: Stretches   Gastroc Stretch 3 reps;30 seconds    Gastroc Stretch Limitations standing at counter      Knee/Hip Exercises: Standing   Hip Flexion 20 reps;Knee bent    Hip ADduction 10 reps;Both    Forward Step Up 10 reps;Both    Stairs up/down 5 steps with HR, 6 in,  x 4;    Other Standing Knee Exercises L/R and A/P wight shifts (CGA) x 20 ; staggered stance wight shift w same side reach x 10 bil;     Other Standing Knee Exercises Walking around cones and stepping up over cones with SPC, 10 ft x 6;      Knee/Hip Exercises: Seated   Long Arc Quad 20 reps;Both                    PT Short Term Goals - 11/22/20 0824      PT SHORT TERM GOAL #1   Title Pt to be independent wtih initial HEP    Time 2    Period Weeks    Status New    Target Date 11/24/20      PT SHORT TERM GOAL #2   Title Pt to demo proper use of SPC, and report using at least 75 % of time.    Time 2    Period Weeks    Status New    Target Date 11/24/20             PT Long Term Goals - 11/22/20 0830      PT LONG TERM GOAL #1   Title  Pt to be independent with final HEP    Time 6    Period Weeks    Status New    Target Date 12/22/20      PT LONG TERM GOAL #2   Title Pt to demo safe use of SPC at all times, to improve safety and fall risk    Time 6    Period Weeks    Status New    Target Date 12/22/20      PT LONG TERM GOAL #3   Title Pt to demo improved score on BERG by at least 3 points.    Time 6    Period Weeks    Status New    Target Date 12/22/20                 Plan - 11/22/20 0843    Clinical Impression Statement Pt educated on initial HEP for LE strength today. Challenged with balance activities today. Tendency for posterior lean/ lob. Education on safe/proper use and sequencing with SPC, recommneded pt use at all times.    Personal Factors and Comorbidities Age    Examination-Activity Limitations Stand;Lift;Locomotion Level;Bend;Reach Overhead;Transfers;Carry;Squat;Dressing;Stairs    Examination-Participation Restrictions Cleaning;Community Activity;Laundry;Shop    Stability/Clinical Decision Making Stable/Uncomplicated    Rehab Potential Good    PT Frequency 2x / week    PT Duration 6 weeks    PT Treatment/Interventions ADLs/Self Care Home Management;Cryotherapy;Electrical Stimulation;Iontophoresis 4mg /ml Dexamethasone;Moist Heat;Ultrasound;Therapeutic  exercise;Therapeutic activities;Functional mobility training;Stair training;Gait training;Balance training;Neuromuscular re-education;Patient/family education;Orthotic Fit/Training;Manual techniques;Passive range of motion;Dry needling;Energy conservation;Vasopneumatic Device;Joint Manipulations;Spinal Manipulations    Consulted and Agree with Plan of Care Patient           Patient will benefit from skilled therapeutic intervention in order to improve the following deficits and impairments:  Abnormal gait,Decreased mobility,Decreased activity tolerance,Decreased endurance,Decreased strength,Hypomobility,Impaired flexibility,Difficulty walking,Decreased safety awareness,Decreased balance  Visit Diagnosis: Other abnormalities of gait and mobility  Muscle weakness (generalized)     Problem List Patient Active Problem List   Diagnosis Date Noted  . Senile purpura ([place]) 05/03/2020  . Aortic atherosclerosis ([place]) 05/21/2019  . Fall at home, initial encounter 05/15/2019  . Right rib fracture 05/15/2019  . Hyperglycemia 04/28/2019  . Insect bite of left ankle 04/30/2017  . Gout 03/14/2017  . Vitamin D deficiency 03/14/2017  . BPH associated with nocturia 03/14/2017  . Hyperlipidemia 03/14/2017  . GERD (gastroesophageal reflux disease) 03/14/2017  . Peripheral polyneuropathy 02/16/2016  . Diplopia 11/19/2015    Lyndee Hensen, PT, DPT 8:57 AM  11/22/20    [facility] [place], [place], [address] Phone: [phone]   Fax:  [phone]  Name: Allen Robinson MRN: [id number] Date of Birth: August 11, 1926

## 2020-11-22 NOTE — Patient Instructions (Signed)
Access Code: [email or web address] URL: [email or web address] Date: 11/22/2020 Prepared by: Sedalia Muta  Exercises Standing Gastroc Stretch - 2 x daily - 3 reps - 20 hold Seated Knee Extension AROM - 1 x daily - 2 sets - 10 reps Seated Heel Raise - 1 x daily - 2 sets - 10 reps Standing March with Counter Support - 1 x daily - 2 sets - 10 reps Standing Weight Shift Side to Side - 1 x daily - 2 sets - 10 reps

## 2020-11-22 NOTE — Therapy (Signed)
[facility] [address] [place], [place], [address] Phone: [phone]   Fax:  [phone]  Physical Therapy Evaluation  Patient Details  Name: Allen Robinson MRN: [id number] Date of Birth: 05/20/1926 Referring Provider (PT): Garret Reddish   Encounter Date: 11/10/2020   PT End of Session - 11/22/20 0818    Visit Number 1    Number of Visits 12    Date for PT Re-Evaluation 12/22/20    Authorization Type Medicare    PT Start Time 1017    PT Stop Time 1059    PT Time Calculation (min) 42 min    Equipment Utilized During Treatment Gait belt    Activity Tolerance Patient tolerated treatment well    Behavior During Therapy [facility] for tasks assessed/performed           Past Medical History:  Diagnosis Date  . Arthritis    hands  . Chicken pox   . Hyperlipidemia   . Pneumonia   . Seasonal allergies     Past Surgical History:  Procedure Laterality Date  . TONSILLECTOMY     1936    There were no vitals filed for this visit.    Subjective Assessment - 11/22/20 0812    Subjective Pt reports decreasing balance. Thinks he has 7-10 falls in last year. Feels like he looses balance outside and on uneven ground. States decreased sensation in feet. Has to use railings on stairs.    Limitations Lifting;Standing;Walking;House hold activities    Patient Stated Goals improve balance, decrease falls    Currently in Pain? No/denies              [facility] PT Assessment - 11/22/20 0001      Assessment   Medical Diagnosis Gait/balance    Referring Provider (PT) Garret Reddish    Prior Therapy no      Precautions   Precautions Fall      Balance Screen   Has the patient fallen in the past 6 months Yes    How many times? 4    Has the patient had a decrease in activity level because of a fear of falling?  Yes    Is the patient reluctant to leave their home because of a fear of falling?  No      Home Manufacturing systems engineer residence    Living Arrangements Spouse/significant other      Prior Function   Level of Independence Independent      Cognition   Overall Cognitive Status Within Functional Limits for tasks assessed      Posture/Postural Control   Posture Comments Standing: increased pressue in ball and heel of foot, toes up off ground trying to stabilize, bilaterally R>L.      ROM / Strength   AROM / PROM / Strength AROM;Strength      AROM   Overall AROM Comments HIps: WFL, Knees: WFL, Ankle: decreased DF bilaterally      Strength   Overall Strength Comments Hips: 4-/5, KNee: 4/5,      Palpation   Palpation comment Tight gastrocs, decreased DF bilaterally.      Ambulation/Gait   Gait Comments poor stability with gait, mod-high variance in step width and height, not using AD today      Standardized Balance Assessment   Standardized Balance Assessment Berg Balance Test      Berg Balance Test   Sit to Stand Able to stand without using hands and stabilize  independently    Standing Unsupported Able to stand safely 2 minutes    Sitting with Back Unsupported but Feet Supported on Floor or Stool Able to sit safely and securely 2 minutes    Stand to Sit Sits safely with minimal use of hands    Transfers Able to transfer safely, minor use of hands    Standing Unsupported with Eyes Closed Able to stand 10 seconds safely    Standing Unsupported with Feet Together Able to place feet together independently and stand for 1 minute with supervision    From Standing, Reach Forward with Outstretched Arm Can reach forward >12 cm safely (5")    From Standing Position, Pick up Object from Floor Able to pick up shoe safely and easily    From Standing Position, Turn to Look Behind Over each Shoulder Turn sideways only but maintains balance    Turn 360 Degrees Able to turn 360 degrees safely but slowly    Standing Unsupported, Alternately Place Feet on Step/Stool Able to complete 4 steps  without aid or supervision    Standing Unsupported, One Foot in Front Able to take small step independently and hold 30 seconds    Standing on One Leg Tries to lift leg/unable to hold 3 seconds but remains standing independently    Total Score 43                      Objective measurements completed on examination: See above findings.               PT Education - 11/22/20 0817    Education Details PT POC, home safety,    Person(s) Educated Patient    Methods Explanation;Demonstration;Verbal cues    Comprehension Verbalized understanding;Returned demonstration;Verbal cues required;Need further instruction            PT Short Term Goals - 11/22/20 0824      PT SHORT TERM GOAL #1   Title Pt to be independent wtih initial HEP    Time 2    Period Weeks    Status New    Target Date 11/24/20      PT SHORT TERM GOAL #2   Title Pt to demo proper use of SPC, and report using at least 75 % of time.    Time 2    Period Weeks    Status New    Target Date 11/24/20             PT Long Term Goals - 11/22/20 0830      PT LONG TERM GOAL #1   Title Pt to be independent with final HEP    Time 6    Period Weeks    Status New    Target Date 12/22/20      PT LONG TERM GOAL #2   Title Pt to demo safe use of SPC at all times, to improve safety and fall risk    Time 6    Period Weeks    Status New    Target Date 12/22/20      PT LONG TERM GOAL #3   Title Pt to demo improved score on BERG by at least 3 points.    Time 6    Period Weeks    Status New    Target Date 12/22/20                  Plan - 11/22/20 0819    Clinical Impression Statement Pt  presents with primary complaint of decreased balance and increased falls. Pt with poor dynamic balance with mobility. Decreased scoring on balance tests today. Pt has fallen several times, and is fall risk. Recommended pt use cane at all times. Pt to benefit from skilled PT to improve defiicts and improve  safety, reduce falls.    Personal Factors and Comorbidities Age    Examination-Activity Limitations Stand;Lift;Locomotion Level;Bend;Reach Overhead;Transfers;Carry;Squat;Dressing;Stairs    Examination-Participation Restrictions Cleaning;Community Activity;Laundry;Shop    Stability/Clinical Decision Making Stable/Uncomplicated    Clinical Decision Making Low    Rehab Potential Good    PT Frequency 2x / week    PT Duration 6 weeks    PT Treatment/Interventions ADLs/Self Care Home Management;Cryotherapy;Electrical Stimulation;Iontophoresis 4mg /ml Dexamethasone;Moist Heat;Ultrasound;Therapeutic exercise;Therapeutic activities;Functional mobility training;Stair training;Gait training;Balance training;Neuromuscular re-education;Patient/family education;Orthotic Fit/Training;Manual techniques;Passive range of motion;Dry needling;Energy conservation;Vasopneumatic Device;Joint Manipulations;Spinal Manipulations    Consulted and Agree with Plan of Care Patient           Patient will benefit from skilled therapeutic intervention in order to improve the following deficits and impairments:  Abnormal gait,Decreased mobility,Decreased activity tolerance,Decreased endurance,Decreased strength,Hypomobility,Impaired flexibility,Difficulty walking,Decreased safety awareness,Decreased balance  Visit Diagnosis: Other abnormalities of gait and mobility  Muscle weakness (generalized)     Problem List Patient Active Problem List   Diagnosis Date Noted  . Senile purpura (HCC) 05/03/2020  . Aortic atherosclerosis (HCC) 05/21/2019  . Fall at home, initial encounter 05/15/2019  . Right rib fracture 05/15/2019  . Hyperglycemia 04/28/2019  . Insect bite of left ankle 04/30/2017  . Gout 03/14/2017  . Vitamin D deficiency 03/14/2017  . BPH associated with nocturia 03/14/2017  . Hyperlipidemia 03/14/2017  . GERD (gastroesophageal reflux disease) 03/14/2017  . Peripheral polyneuropathy 02/16/2016  . Diplopia  11/19/2015    11/21/2015, PT, DPT 8:38 AM  11/22/20    [facility] [address] [place], [place], [place] Phone: [phone]   Fax:  [phone]  Name: Allen Robinson MRN: Waymond Cera Date of Birth: [birth year]

## 2020-11-24 ENCOUNTER — Ambulatory Visit (INDEPENDENT_AMBULATORY_CARE_PROVIDER_SITE_OTHER): Payer: Medicare Other | Admitting: Physical Therapy

## 2020-11-24 ENCOUNTER — Other Ambulatory Visit: Payer: Self-pay

## 2020-11-24 ENCOUNTER — Encounter: Payer: Self-pay | Admitting: Physical Therapy

## 2020-11-24 DIAGNOSIS — R2689 Other abnormalities of gait and mobility: Secondary | ICD-10-CM | POA: Diagnosis not present

## 2020-11-24 DIAGNOSIS — M6281 Muscle weakness (generalized): Secondary | ICD-10-CM

## 2020-11-24 NOTE — Therapy (Signed)
[facility] [address] [place], [place], [address] Phone: [phone]   Fax:  [phone]  Physical Therapy Treatment  Patient Details  Name: Allen Robinson MRN: [id number] Date of Birth: 1926/03/15 Referring Provider (PT): Tana Conch   Encounter Date: 11/24/2020   PT End of Session - 11/24/20 0938    Visit Number 3    Number of Visits 12    Date for PT Re-Evaluation 12/22/20    Authorization Type Medicare    PT Start Time 0932    PT Stop Time 1020    PT Time Calculation (min) 48 min    Equipment Utilized During Treatment Gait belt    Activity Tolerance Patient tolerated treatment well    Behavior During Therapy [facility] for tasks assessed/performed           Past Medical History:  Diagnosis Date  . Arthritis    hands  . Chicken pox   . Hyperlipidemia   . Pneumonia   . Seasonal allergies     Past Surgical History:  Procedure Laterality Date  . TONSILLECTOMY     1936    There were no vitals filed for this visit.   Subjective Assessment - 11/24/20 0938    Subjective no new complaints.    Currently in Pain? No/denies              [facility] PT Assessment - 11/24/20 0001      Standardized Balance Assessment   Standardized Balance Assessment Dynamic Gait Index      Dynamic Gait Index   Level Surface Mild Impairment    Change in Gait Speed Moderate Impairment    Gait with Horizontal Head Turns Severe Impairment    Gait with Vertical Head Turns Moderate Impairment    Gait and Pivot Turn Moderate Impairment    Step Over Obstacle Moderate Impairment    Step Around Obstacles Mild Impairment    Steps Moderate Impairment    Total Score 9                         [facility] Adult PT Treatment/Exercise - 11/24/20 0001      Knee/Hip Exercises: Stretches   Gastroc Stretch 4 reps;30 seconds;Both    Gastroc Stretch Limitations standing at counter      Knee/Hip Exercises: Aerobic   Recumbent Bike L1 x  8 min      Knee/Hip Exercises: Standing   Heel Raises 10 reps    Hip Flexion 20 reps;Knee bent    Hip ADduction 10 reps;2 sets    Stairs up/down 5 steps with HR, 6 in,  x 2;    Other Standing Knee Exercises L/R and A/P wight shifts (CGA) x 20 ;Post stepping w weight shift x 10 bil;    Other Standing Knee Exercises Toe Taps on 6 in step x10 no UE support; ; L and R head turns while walking 25 ft x 4; CGA      Knee/Hip Exercises: Seated   Long Arc Quad 20 reps;Both                    PT Short Term Goals - 11/22/20 0824      PT SHORT TERM GOAL #1   Title Pt to be independent wtih initial HEP    Time 2    Period Weeks    Status New    Target Date 11/24/20      PT SHORT TERM  GOAL #2   Title Pt to demo proper use of SPC, and report using at least 75 % of time.    Time 2    Period Weeks    Status New    Target Date 11/24/20             PT Long Term Goals - 11/22/20 0830      PT LONG TERM GOAL #1   Title Pt to be independent with final HEP    Time 6    Period Weeks    Status New    Target Date 12/22/20      PT LONG TERM GOAL #2   Title Pt to demo safe use of SPC at all times, to improve safety and fall risk    Time 6    Period Weeks    Status New    Target Date 12/22/20      PT LONG TERM GOAL #3   Title Pt to demo improved score on BERG by at least 3 points.    Time 6    Period Weeks    Status New    Target Date 12/22/20                 Plan - 11/24/20 1030    Clinical Impression Statement Pt with low score on DGI, and much difficulty with tasks in DGI, Increased body movment with head turns. Pt will benefit from continued practice with these activities, as well as stairs next visit. Reviewed HEP in detai, pt req max cuing for correct performance.    Personal Factors and Comorbidities Age    Examination-Activity Limitations Stand;Lift;Locomotion Level;Bend;Reach Overhead;Transfers;Carry;Squat;Dressing;Stairs    Examination-Participation  Restrictions Cleaning;Community Activity;Laundry;Shop    Stability/Clinical Decision Making Stable/Uncomplicated    Rehab Potential Good    PT Frequency 2x / week    PT Duration 6 weeks    PT Treatment/Interventions ADLs/Self Care Home Management;Cryotherapy;Electrical Stimulation;Iontophoresis 4mg /ml Dexamethasone;Moist Heat;Ultrasound;Therapeutic exercise;Therapeutic activities;Functional mobility training;Stair training;Gait training;Balance training;Neuromuscular re-education;Patient/family education;Orthotic Fit/Training;Manual techniques;Passive range of motion;Dry needling;Energy conservation;Vasopneumatic Device;Joint Manipulations;Spinal Manipulations    Consulted and Agree with Plan of Care Patient           Patient will benefit from skilled therapeutic intervention in order to improve the following deficits and impairments:  Abnormal gait,Decreased mobility,Decreased activity tolerance,Decreased endurance,Decreased strength,Hypomobility,Impaired flexibility,Difficulty walking,Decreased safety awareness,Decreased balance  Visit Diagnosis: Other abnormalities of gait and mobility  Muscle weakness (generalized)     Problem List Patient Active Problem List   Diagnosis Date Noted  . Senile purpura ([place]) 05/03/2020  . Aortic atherosclerosis ([place]) 05/21/2019  . Fall at home, initial encounter 05/15/2019  . Right rib fracture 05/15/2019  . Hyperglycemia 04/28/2019  . Insect bite of left ankle 04/30/2017  . Gout 03/14/2017  . Vitamin D deficiency 03/14/2017  . BPH associated with nocturia 03/14/2017  . Hyperlipidemia 03/14/2017  . GERD (gastroesophageal reflux disease) 03/14/2017  . Peripheral polyneuropathy 02/16/2016  . Diplopia 11/19/2015    Lyndee Hensen, PT, DPT 10:31 AM  11/24/20    [facility] [place], [place], [address] Phone: [phone]   Fax:  [phone]  Name: Leanord Deitrich MRN:  [id number] Date of Birth: May 16, 1926

## 2020-11-29 ENCOUNTER — Encounter: Payer: Self-pay | Admitting: Physical Therapy

## 2020-11-29 ENCOUNTER — Other Ambulatory Visit: Payer: Self-pay

## 2020-11-29 ENCOUNTER — Ambulatory Visit (INDEPENDENT_AMBULATORY_CARE_PROVIDER_SITE_OTHER): Payer: Medicare Other | Admitting: Physical Therapy

## 2020-11-29 DIAGNOSIS — R2689 Other abnormalities of gait and mobility: Secondary | ICD-10-CM

## 2020-11-29 DIAGNOSIS — M6281 Muscle weakness (generalized): Secondary | ICD-10-CM

## 2020-11-30 NOTE — Therapy (Signed)
[facility] [address] [place], [place], [address] Phone: [phone]   Fax:  [phone]  Physical Therapy Treatment  Patient Details  Name: Allen Robinson MRN: [id number] Date of Birth: June 10, 1926 Referring Provider (PT): Garret Reddish   Encounter Date: 11/29/2020   PT End of Session - 11/30/20 0833    Visit Number 4    Number of Visits 12    Date for PT Re-Evaluation 12/22/20    Authorization Type Medicare    PT Start Time 0932    PT Stop Time 1014    PT Time Calculation (min) 42 min    Equipment Utilized During Treatment Gait belt    Activity Tolerance Patient tolerated treatment well    Behavior During Therapy [facility] for tasks assessed/performed           Past Medical History:  Diagnosis Date  . Arthritis    hands  . Chicken pox   . Hyperlipidemia   . Pneumonia   . Seasonal allergies     Past Surgical History:  Procedure Laterality Date  . TONSILLECTOMY     1936    There were no vitals filed for this visit.   Subjective Assessment - 11/29/20 0936    Subjective Pt states he has been practicing head turns while walking.                             [facility] Adult PT Treatment/Exercise - 11/30/20 0001      Knee/Hip Exercises: Stretches   Gastroc Stretch 30 seconds;Both;3 reps    Gastroc Stretch Limitations standing at counter      Knee/Hip Exercises: Standing   Hip Flexion 20 reps;Knee bent    Forward Step Up 10 reps;Both;Step Height: 6";Hand Hold: 1    Stairs up/down 5 steps with HR, 6 in,  x 6 using SPC    Other Standing Knee Exercises Toe Taps on 6 in step x10 no UE support; ; L and R head turns while walking 25 ft x 8; CGA;  posterior stepping w weight shifting x 10 bil; Staggered stance weight shift with same side reach x 10 bil;      Knee/Hip Exercises: Seated   Long Arc Quad 20 reps;Both    Long Arc Quad Weight 3 lbs.    Sit to [employer] 10 reps                    PT  Short Term Goals - 11/22/20 0824      PT SHORT TERM GOAL #1   Title Pt to be independent wtih initial HEP    Time 2    Period Weeks    Status New    Target Date 11/24/20      PT SHORT TERM GOAL #2   Title Pt to demo proper use of SPC, and report using at least 75 % of time.    Time 2    Period Weeks    Status New    Target Date 11/24/20             PT Long Term Goals - 11/22/20 0830      PT LONG TERM GOAL #1   Title Pt to be independent with final HEP    Time 6    Period Weeks    Status New    Target Date 12/22/20      PT LONG TERM GOAL #2  Title Pt to demo safe use of SPC at all times, to improve safety and fall risk    Time 6    Period Weeks    Status New    Target Date 12/22/20      PT LONG TERM GOAL #3   Title Pt to demo improved score on BERG by at least 3 points.    Time 6    Period Weeks    Status New    Target Date 12/22/20                 Plan - 11/30/20 0840    Clinical Impression Statement Pt with improving ability for head turns with increased practice. Focus on safe stair negotiation today, with use of SPC, for when pt does not have railing. Pt eager to perform all exercises at home, but continued to discuss that it is not safe for him to perform balance exercises at home at this time, due to frequent falls.    Personal Factors and Comorbidities Age    Examination-Activity Limitations Stand;Lift;Locomotion Level;Bend;Reach Overhead;Transfers;Carry;Squat;Dressing;Stairs    Examination-Participation Restrictions Cleaning;Community Activity;Laundry;Shop    Stability/Clinical Decision Making Stable/Uncomplicated    Rehab Potential Good    PT Frequency 2x / week    PT Duration 6 weeks    PT Treatment/Interventions ADLs/Self Care Home Management;Cryotherapy;Electrical Stimulation;Iontophoresis 4mg /ml Dexamethasone;Moist Heat;Ultrasound;Therapeutic exercise;Therapeutic activities;Functional mobility training;Stair training;Gait training;Balance  training;Neuromuscular re-education;Patient/family education;Orthotic Fit/Training;Manual techniques;Passive range of motion;Dry needling;Energy conservation;Vasopneumatic Device;Joint Manipulations;Spinal Manipulations    Consulted and Agree with Plan of Care Patient           Patient will benefit from skilled therapeutic intervention in order to improve the following deficits and impairments:  Abnormal gait,Decreased mobility,Decreased activity tolerance,Decreased endurance,Decreased strength,Hypomobility,Impaired flexibility,Difficulty walking,Decreased safety awareness,Decreased balance  Visit Diagnosis: Other abnormalities of gait and mobility  Muscle weakness (generalized)     Problem List Patient Active Problem List   Diagnosis Date Noted  . Senile purpura ([place]) 05/03/2020  . Aortic atherosclerosis ([place]) 05/21/2019  . Fall at home, initial encounter 05/15/2019  . Right rib fracture 05/15/2019  . Hyperglycemia 04/28/2019  . Insect bite of left ankle 04/30/2017  . Gout 03/14/2017  . Vitamin D deficiency 03/14/2017  . BPH associated with nocturia 03/14/2017  . Hyperlipidemia 03/14/2017  . GERD (gastroesophageal reflux disease) 03/14/2017  . Peripheral polyneuropathy 02/16/2016  . Diplopia 11/19/2015    Lyndee Hensen 11/30/2020, 8:42 AM  [facility] [place], [place], [address] Phone: [phone]   Fax:  [phone]  Name: Allen Robinson MRN: [id number] Date of Birth: 06/10/1926

## 2020-12-01 ENCOUNTER — Encounter: Payer: Self-pay | Admitting: Physical Therapy

## 2020-12-01 ENCOUNTER — Ambulatory Visit (INDEPENDENT_AMBULATORY_CARE_PROVIDER_SITE_OTHER): Payer: Medicare Other | Admitting: Physical Therapy

## 2020-12-01 DIAGNOSIS — M6281 Muscle weakness (generalized): Secondary | ICD-10-CM

## 2020-12-01 DIAGNOSIS — R2689 Other abnormalities of gait and mobility: Secondary | ICD-10-CM

## 2020-12-01 NOTE — Therapy (Signed)
[facility] [address] [place], [place], [address] Phone: [phone]   Fax:  [phone]  Physical Therapy Treatment  Patient Details  Name: Allen Robinson MRN: [id number] Date of Birth: 05-24-1926 Referring Provider (PT): Garret Reddish   Encounter Date: 12/01/2020   PT End of Session - 12/01/20 1045    Visit Number 5    Number of Visits 12    Date for PT Re-Evaluation 12/22/20    Authorization Type Medicare    PT Start Time 0934    PT Stop Time 1014    PT Time Calculation (min) 40 min    Equipment Utilized During Treatment Gait belt    Activity Tolerance Patient tolerated treatment well    Behavior During Therapy [facility] for tasks assessed/performed           Past Medical History:  Diagnosis Date  . Arthritis    hands  . Chicken pox   . Hyperlipidemia   . Pneumonia   . Seasonal allergies     Past Surgical History:  Procedure Laterality Date  . TONSILLECTOMY     1936    There were no vitals filed for this visit.   Subjective Assessment - 12/01/20 1045    Subjective No new complaints.    Currently in Pain? No/denies                             [facility] Adult PT Treatment/Exercise - 12/01/20 0001      Knee/Hip Exercises: Stretches   Gastroc Stretch --    Gastroc Stretch Limitations --      Knee/Hip Exercises: Standing   Heel Raises 20 reps    Hip Flexion 20 reps;Knee bent    Hip Abduction 2 sets;10 reps;Both    Forward Step Up --    Stairs up/down 5 steps with 1 HR, recipricol, 6 in,  x 6 ;  up/down 5 steps without HR, using SPC , step to, x 8 (max cuing and education on sequencing with SPC)    Other Standing Knee Exercises L/R and A/P weight shifts x 20;  Side stepping 10 ft x 6; Dynamic walking: around cones, stepping over cones, head turns, and picking up cones 20 ft x 4 each;      Knee/Hip Exercises: Seated   Long Arc Quad 20 reps;Both    Long Arc Quad Weight 3 lbs.    Sit to  [employer] --                    PT Short Term Goals - 11/22/20 0824      PT SHORT TERM GOAL #1   Title Pt to be independent wtih initial HEP    Time 2    Period Weeks    Status New    Target Date 11/24/20      PT SHORT TERM GOAL #2   Title Pt to demo proper use of SPC, and report using at least 75 % of time.    Time 2    Period Weeks    Status New    Target Date 11/24/20             PT Long Term Goals - 11/22/20 0830      PT LONG TERM GOAL #1   Title Pt to be independent with final HEP    Time 6    Period Weeks    Status New  Target Date 12/22/20      PT LONG TERM GOAL #2   Title Pt to demo safe use of SPC at all times, to improve safety and fall risk    Time 6    Period Weeks    Status New    Target Date 12/22/20      PT LONG TERM GOAL #3   Title Pt to demo improved score on BERG by at least 3 points.    Time 6    Period Weeks    Status New    Target Date 12/22/20                 Plan - 12/01/20 1046    Clinical Impression Statement Education and practice, with max cuing for stair negotiation without Hand rails, using SPC. Pt improving with confidence for stepping up over cones today. Updated HEP for safe ther ex.    Personal Factors and Comorbidities Age    Examination-Activity Limitations Stand;Lift;Locomotion Level;Bend;Reach Overhead;Transfers;Carry;Squat;Dressing;Stairs    Examination-Participation Restrictions Cleaning;Community Activity;Laundry;Shop    Stability/Clinical Decision Making Stable/Uncomplicated    Rehab Potential Good    PT Frequency 2x / week    PT Duration 6 weeks    PT Treatment/Interventions ADLs/Self Care Home Management;Cryotherapy;Electrical Stimulation;Iontophoresis 4mg /ml Dexamethasone;Moist Heat;Ultrasound;Therapeutic exercise;Therapeutic activities;Functional mobility training;Stair training;Gait training;Balance training;Neuromuscular re-education;Patient/family education;Orthotic Fit/Training;Manual  techniques;Passive range of motion;Dry needling;Energy conservation;Vasopneumatic Device;Joint Manipulations;Spinal Manipulations    Consulted and Agree with Plan of Care Patient           Patient will benefit from skilled therapeutic intervention in order to improve the following deficits and impairments:  Abnormal gait,Decreased mobility,Decreased activity tolerance,Decreased endurance,Decreased strength,Hypomobility,Impaired flexibility,Difficulty walking,Decreased safety awareness,Decreased balance  Visit Diagnosis: Other abnormalities of gait and mobility  Muscle weakness (generalized)     Problem List Patient Active Problem List   Diagnosis Date Noted  . Senile purpura ([place]) 05/03/2020  . Aortic atherosclerosis ([place]) 05/21/2019  . Fall at home, initial encounter 05/15/2019  . Right rib fracture 05/15/2019  . Hyperglycemia 04/28/2019  . Insect bite of left ankle 04/30/2017  . Gout 03/14/2017  . Vitamin D deficiency 03/14/2017  . BPH associated with nocturia 03/14/2017  . Hyperlipidemia 03/14/2017  . GERD (gastroesophageal reflux disease) 03/14/2017  . Peripheral polyneuropathy 02/16/2016  . Diplopia 11/19/2015    Lyndee Hensen, PT, DPT 10:47 AM  12/01/20    [facility] [place], [place], [address] Phone: [phone]   Fax:  [phone]  Name: Allen Robinson MRN: [id number] Date of Birth: Apr 19, 1926

## 2020-12-07 ENCOUNTER — Encounter: Payer: Medicare Other | Admitting: Physical Therapy

## 2020-12-09 ENCOUNTER — Encounter: Payer: Self-pay | Admitting: Physical Therapy

## 2020-12-09 ENCOUNTER — Ambulatory Visit (INDEPENDENT_AMBULATORY_CARE_PROVIDER_SITE_OTHER): Payer: Medicare Other | Admitting: Physical Therapy

## 2020-12-09 ENCOUNTER — Other Ambulatory Visit: Payer: Self-pay

## 2020-12-09 DIAGNOSIS — R2689 Other abnormalities of gait and mobility: Secondary | ICD-10-CM

## 2020-12-09 DIAGNOSIS — M6281 Muscle weakness (generalized): Secondary | ICD-10-CM | POA: Diagnosis not present

## 2020-12-09 NOTE — Therapy (Signed)
[facility] [address] [place], [place], [address] Phone: [phone]   Fax:  [phone]  Physical Therapy Treatment  Patient Details  Name: Allen Robinson MRN: [id number] Date of Birth: 05-05-1926 Referring Provider (PT): Garret Reddish   Encounter Date: 12/09/2020   PT End of Session - 12/09/20 1309    Visit Number 6    Number of Visits 12    Date for PT Re-Evaluation 12/22/20    Authorization Type Medicare    PT Start Time 0931    PT Stop Time 1013    PT Time Calculation (min) 42 min    Equipment Utilized During Treatment Gait belt    Activity Tolerance Patient tolerated treatment well    Behavior During Therapy [facility] for tasks assessed/performed           Past Medical History:  Diagnosis Date  . Arthritis    hands  . Chicken pox   . Hyperlipidemia   . Pneumonia   . Seasonal allergies     Past Surgical History:  Procedure Laterality Date  . TONSILLECTOMY     1936    There were no vitals filed for this visit.   Subjective Assessment - 12/09/20 0938    Subjective Pt slipped and fell on ice one time, able to get himself up. States no pain.    Currently in Pain? No/denies                             [facility] Adult PT Treatment/Exercise - 12/09/20 0001      Knee/Hip Exercises: Stretches   Gastroc Stretch 30 seconds;Both;3 reps    Gastroc Stretch Limitations standing at counter, also seated with towel, education for HEP      Knee/Hip Exercises: Standing   Heel Raises --    Hip Flexion --    Hip Abduction --    Functional Squat 15 reps    Functional Squat Limitations at mat table    Stairs up/down 5 steps with 1 HR and SPC, recipricol, 6 in,  x 5 ;  up/down 5 steps without HR, using SPC , step to, x 5 (mod cuing and education on sequencing with SPC)    Other Standing Knee Exercises Toe taps 6 in step x 20; no UE support/ CGA    Other Standing Knee Exercises L/R and A/P weight shifts x  20;  Side stepping 10 ft x 6; Dynamic walking: around cones, stepping over cones, head turns, and picking up cones 20 ft x 4 each;      Knee/Hip Exercises: Seated   Long Arc Quad 20 reps;Both    Long Arc Quad Weight 4 lbs.                    PT Short Term Goals - 11/22/20 0824      PT SHORT TERM GOAL #1   Title Pt to be independent wtih initial HEP    Time 2    Period Weeks    Status New    Target Date 11/24/20      PT SHORT TERM GOAL #2   Title Pt to demo proper use of SPC, and report using at least 75 % of time.    Time 2    Period Weeks    Status New    Target Date 11/24/20             PT Long Term  Goals - 11/22/20 0830      PT LONG TERM GOAL #1   Title Pt to be independent with final HEP    Time 6    Period Weeks    Status New    Target Date 12/22/20      PT LONG TERM GOAL #2   Title Pt to demo safe use of SPC at all times, to improve safety and fall risk    Time 6    Period Weeks    Status New    Target Date 12/22/20      PT LONG TERM GOAL #3   Title Pt to demo improved score on BERG by at least 3 points.    Time 6    Period Weeks    Status New    Target Date 12/22/20                 Plan - 12/09/20 1310    Clinical Impression Statement Pt still requires mod/max cueing for sequencing/ cane use on stairs, but is imroving with safety with this. He is also improving with dynamic gait, with improving stability and less cuing with this. Pt to benefit from continued care.    Personal Factors and Comorbidities Age    Examination-Activity Limitations Stand;Lift;Locomotion Level;Bend;Reach Overhead;Transfers;Carry;Squat;Dressing;Stairs    Examination-Participation Restrictions Cleaning;Community Activity;Laundry;Shop    Stability/Clinical Decision Making Stable/Uncomplicated    Rehab Potential Good    PT Frequency 2x / week    PT Duration 6 weeks    PT Treatment/Interventions ADLs/Self Care Home Management;Cryotherapy;Electrical  Stimulation;Iontophoresis 4mg /ml Dexamethasone;Moist Heat;Ultrasound;Therapeutic exercise;Therapeutic activities;Functional mobility training;Stair training;Gait training;Balance training;Neuromuscular re-education;Patient/family education;Orthotic Fit/Training;Manual techniques;Passive range of motion;Dry needling;Energy conservation;Vasopneumatic Device;Joint Manipulations;Spinal Manipulations    Consulted and Agree with Plan of Care Patient           Patient will benefit from skilled therapeutic intervention in order to improve the following deficits and impairments:  Abnormal gait,Decreased mobility,Decreased activity tolerance,Decreased endurance,Decreased strength,Hypomobility,Impaired flexibility,Difficulty walking,Decreased safety awareness,Decreased balance  Visit Diagnosis: Other abnormalities of gait and mobility  Muscle weakness (generalized)     Problem List Patient Active Problem List   Diagnosis Date Noted  . Senile purpura ([place]) 05/03/2020  . Aortic atherosclerosis ([place]) 05/21/2019  . Fall at home, initial encounter 05/15/2019  . Right rib fracture 05/15/2019  . Hyperglycemia 04/28/2019  . Insect bite of left ankle 04/30/2017  . Gout 03/14/2017  . Vitamin D deficiency 03/14/2017  . BPH associated with nocturia 03/14/2017  . Hyperlipidemia 03/14/2017  . GERD (gastroesophageal reflux disease) 03/14/2017  . Peripheral polyneuropathy 02/16/2016  . Diplopia 11/19/2015    Lyndee Hensen, PT, DPT 1:16 PM  12/09/20    [facility] [place], [place], [address] Phone: [phone]   Fax:  [phone]  Name: Allen Robinson MRN: [id number] Date of Birth: 1926/04/27

## 2020-12-14 ENCOUNTER — Encounter: Payer: Self-pay | Admitting: Physical Therapy

## 2020-12-14 ENCOUNTER — Ambulatory Visit (INDEPENDENT_AMBULATORY_CARE_PROVIDER_SITE_OTHER): Payer: Medicare Other | Admitting: *Deleted

## 2020-12-14 ENCOUNTER — Other Ambulatory Visit: Payer: Self-pay

## 2020-12-14 ENCOUNTER — Ambulatory Visit (INDEPENDENT_AMBULATORY_CARE_PROVIDER_SITE_OTHER): Payer: Medicare Other | Admitting: Physical Therapy

## 2020-12-14 DIAGNOSIS — R2689 Other abnormalities of gait and mobility: Secondary | ICD-10-CM | POA: Diagnosis not present

## 2020-12-14 DIAGNOSIS — S51002A Unspecified open wound of left elbow, initial encounter: Secondary | ICD-10-CM | POA: Diagnosis not present

## 2020-12-14 DIAGNOSIS — M6281 Muscle weakness (generalized): Secondary | ICD-10-CM | POA: Diagnosis not present

## 2020-12-14 NOTE — Therapy (Signed)
Allen Robinson [address] [place], [place], [address] Phone: [phone]   Fax:  [phone]  Physical Therapy Treatment  Patient Details  Name: Allen Robinson MRN: [id number] Date of Birth: [birth year] Referring Provider (PT): Garret Reddish   Encounter Date: 12/14/2020   PT End of Session - 12/14/20 0939    Visit Number 7    Number of Visits 12    Date for PT Re-Evaluation 12/22/20    Authorization Type Medicare    PT Start Time 0938    PT Stop Time 1018    PT Time Calculation (min) 40 min    Equipment Utilized During Treatment Gait belt    Activity Tolerance Patient tolerated treatment well    Behavior During Therapy [facility] for tasks assessed/performed           Past Medical History:  Diagnosis Date  . Arthritis    hands  . Chicken pox   . Hyperlipidemia   . Pneumonia   . Seasonal allergies     Past Surgical History:  Procedure Laterality Date  . TONSILLECTOMY     1936    There were no vitals filed for this visit.   Subjective Assessment - 12/14/20 0937    Subjective Pt fell on ice  again, did scrape arm, did not hurt anything else. Nurse able to look at arm today.    Currently in Pain? No/denies                             [facility] Adult PT Treatment/Exercise - 12/14/20 0940      Knee/Hip Exercises: Aerobic   Recumbent Bike L1 x 7 min;      Knee/Hip Exercises: Standing   Heel Raises 15 reps    Hip Flexion 20 reps    Functional Squat 15 reps    Functional Squat Limitations at mat table    Stairs up/down 5 steps with 1 HR , recipricol, 6 in,  x 5 ;  up/down 5 steps without HR, step to, x 5    Other Standing Knee Exercises Toe taps 6 in step x 20; no UE support/ CGA;    Other Standing Knee Exercises A/P weight shifts x 20;  Side stepping and bwd walking 10 ft x 6;  Lateral and fwd stepping with weight shfits x 20 each bil;      Knee/Hip Exercises: Seated   Long Arc Quad 20 reps;Both     Long Arc Quad Weight 4 lbs.    Sit to [employer] 10 reps                    PT Short Term Goals - 11/22/20 0824      PT SHORT TERM GOAL #1   Title Pt to be independent wtih initial HEP    Time 2    Period Weeks    Status New    Target Date 11/24/20      PT SHORT TERM GOAL #2   Title Pt to demo proper use of SPC, and report using at least 75 % of time.    Time 2    Period Weeks    Status New    Target Date 11/24/20             PT Long Term Goals - 11/22/20 0830      PT LONG TERM GOAL #1   Title Pt to be independent with final  HEP    Time 6    Period Weeks    Status New    Target Date 12/22/20      PT LONG TERM GOAL #2   Title Pt to demo safe use of SPC at all times, to improve safety and fall risk    Time 6    Period Weeks    Status New    Target Date 12/22/20      PT LONG TERM GOAL #3   Title Pt to demo improved score on BERG by at least 3 points.    Time 6    Period Weeks    Status New    Target Date 12/22/20                 Plan - 12/14/20 1212    Clinical Impression Statement Cuing today for improving cog, more anteriorly. Pt with tendency for posterior Lob with most activities. Improving ability on stairs, does require use of handrail for safety. Pt to benefit from continued care.    Personal Factors and Comorbidities Age    Examination-Activity Limitations Stand;Lift;Locomotion Level;Bend;Reach Overhead;Transfers;Carry;Squat;Dressing;Stairs    Examination-Participation Restrictions Cleaning;Community Activity;Laundry;Shop    Stability/Clinical Decision Making Stable/Uncomplicated    Rehab Potential Good    PT Frequency 2x / week    PT Duration 6 weeks    PT Treatment/Interventions ADLs/Self Care Home Management;Cryotherapy;Electrical Stimulation;Iontophoresis 4mg /ml Dexamethasone;Moist Heat;Ultrasound;Therapeutic exercise;Therapeutic activities;Functional mobility training;Stair training;Gait training;Balance training;Neuromuscular  re-education;Patient/family education;Orthotic Fit/Training;Manual techniques;Passive range of motion;Dry needling;Energy conservation;Vasopneumatic Device;Joint Manipulations;Spinal Manipulations    Consulted and Agree with Plan of Care Patient           Patient will benefit from skilled therapeutic intervention in order to improve the following deficits and impairments:  Abnormal gait,Decreased mobility,Decreased activity tolerance,Decreased endurance,Decreased strength,Hypomobility,Impaired flexibility,Difficulty walking,Decreased safety awareness,Decreased balance  Visit Diagnosis: Other abnormalities of gait and mobility  Muscle weakness (generalized)     Problem List Patient Active Problem List   Diagnosis Date Noted  . Senile purpura ([place]) 05/03/2020  . Aortic atherosclerosis ([place]) 05/21/2019  . Fall at home, initial encounter 05/15/2019  . Right rib fracture 05/15/2019  . Hyperglycemia 04/28/2019  . Insect bite of left ankle 04/30/2017  . Gout 03/14/2017  . Vitamin D deficiency 03/14/2017  . BPH associated with nocturia 03/14/2017  . Hyperlipidemia 03/14/2017  . GERD (gastroesophageal reflux disease) 03/14/2017  . Peripheral polyneuropathy 02/16/2016  . Diplopia 11/19/2015    Lyndee Hensen, PT, DPT 12:16 PM  12/14/20    [facility] [place], [place], [address] Phone: [phone]   Fax:  [phone]  Name: Allen Robinson MRN: [id number] Date of Birth: October 05, 1926

## 2020-12-14 NOTE — Progress Notes (Signed)
Pt was in clinic for Physical therapy section. He asked if someone can look at his wound.  Pt stated he fell down on icy driveway on 1/61 and scraped his left arm at the elbow area. The area looked clean non-infected, with bruise around it. Pt had nonadhering dressing on it. When I removed the dressing the wound bled for little bit. Consulted with Dr. Yong Channel about the pt and received recommendation to place Bacitracin on it and placed a new dressing.  cleaned the area with NS and pad it dry with sterile 2x2 sponge; places Bacitracin ointment on it and the covered it with Adhesive island dressing.  Provided pt with couple dressing and ointment packets; went over precautions and what to look for in the next day or 2 as far as signs of infection. Pt is scheduled to see Lauren for physical therapy section on Thursday, will look at his wound then and get Dr. Ansel Bong recommendation.

## 2020-12-16 ENCOUNTER — Ambulatory Visit (INDEPENDENT_AMBULATORY_CARE_PROVIDER_SITE_OTHER): Payer: Medicare Other | Admitting: Physical Therapy

## 2020-12-16 ENCOUNTER — Other Ambulatory Visit: Payer: Self-pay

## 2020-12-16 ENCOUNTER — Encounter: Payer: Self-pay | Admitting: Physical Therapy

## 2020-12-16 DIAGNOSIS — M6281 Muscle weakness (generalized): Secondary | ICD-10-CM

## 2020-12-16 DIAGNOSIS — R2689 Other abnormalities of gait and mobility: Secondary | ICD-10-CM | POA: Diagnosis not present

## 2020-12-16 NOTE — Therapy (Signed)
[facility] [address] [place], [place], [address] Phone: [phone]   Fax:  [phone]  Physical Therapy Treatment  Patient Details  Name: Allen Robinson MRN: [id number] Date of Birth: 30-Jan-1926 Referring Provider (PT): Garret Reddish   Encounter Date: 12/16/2020   PT End of Session - 12/16/20 0940    Visit Number 8    Number of Visits 12    Date for PT Re-Evaluation 12/22/20    Authorization Type Medicare    PT Start Time 0930    PT Stop Time 1018    PT Time Calculation (min) 48 min    Equipment Utilized During Treatment Gait belt    Activity Tolerance Patient tolerated treatment well    Behavior During Therapy [facility] for tasks assessed/performed           Past Medical History:  Diagnosis Date  . Arthritis    hands  . Chicken pox   . Hyperlipidemia   . Pneumonia   . Seasonal allergies     Past Surgical History:  Procedure Laterality Date  . TONSILLECTOMY     1936    There were no vitals filed for this visit.   Subjective Assessment - 12/16/20 0940    Subjective Pt states no new complaints. Still has large scrape on arm, nurse did dress it this week.    Currently in Pain? No/denies                             [facility] Adult PT Treatment/Exercise - 12/16/20 0001      Knee/Hip Exercises: Stretches   Active Hamstring Stretch 3 reps;30 seconds    Active Hamstring Stretch Limitations seated    Gastroc Stretch 60 seconds;2 reps    Gastroc Stretch Limitations standing at counter      Knee/Hip Exercises: Aerobic   Recumbent Bike L1 x10  min;      Knee/Hip Exercises: Standing   Hip Flexion 20 reps    Forward Step Up 10 reps;Hand Hold: 0;Step Height: 4";Step Height: 6"    Forward Step Up Limitations 4 and 6 height    Functional Squat 20 reps    Functional Squat Limitations at mat table    Gait Training 35 ft x 8 quick pace    Other Standing Knee Exercises AirEx: L/R weight shifts x 20;   Torso turns x 10, head turns x 10 UE reach x 5 bil; Side stepping and bwd walking 10 ft x 6;      Knee/Hip Exercises: Seated   Long Arc Quad 20 reps;Both    Long Arc Quad Weight 4 lbs.    Sit to [employer] 10 reps                    PT Short Term Goals - 11/22/20 0824      PT SHORT TERM GOAL #1   Title Pt to be independent wtih initial HEP    Time 2    Period Weeks    Status New    Target Date 11/24/20      PT SHORT TERM GOAL #2   Title Pt to demo proper use of SPC, and report using at least 75 % of time.    Time 2    Period Weeks    Status New    Target Date 11/24/20             PT Long Term Goals -  11/22/20 0830      PT LONG TERM GOAL #1   Title Pt to be independent with final HEP    Time 6    Period Weeks    Status New    Target Date 12/22/20      PT LONG TERM GOAL #2   Title Pt to demo safe use of SPC at all times, to improve safety and fall risk    Time 6    Period Weeks    Status New    Target Date 12/22/20      PT LONG TERM GOAL #3   Title Pt to demo improved score on BERG by at least 3 points.    Time 6    Period Weeks    Status New    Target Date 12/22/20                 Plan - 12/16/20 1114    Clinical Impression Statement Pt with improved ability for step up without UE support today, still challenged, but no LOB. Less cueing for posture today, pt able to self correct during standing exercises. Pt still with low confidence with his balance and dynamic movements, although improving with this. Plan to progress stability as tolerated.    Personal Factors and Comorbidities Age    Examination-Activity Limitations Stand;Lift;Locomotion Level;Bend;Reach Overhead;Transfers;Carry;Squat;Dressing;Stairs    Examination-Participation Restrictions Cleaning;Community Activity;Laundry;Shop    Stability/Clinical Decision Making Stable/Uncomplicated    Rehab Potential Good    PT Frequency 2x / week    PT Duration 6 weeks    PT Treatment/Interventions  ADLs/Self Care Home Management;Cryotherapy;Electrical Stimulation;Iontophoresis 4mg /ml Dexamethasone;Moist Heat;Ultrasound;Therapeutic exercise;Therapeutic activities;Functional mobility training;Stair training;Gait training;Balance training;Neuromuscular re-education;Patient/family education;Orthotic Fit/Training;Manual techniques;Passive range of motion;Dry needling;Energy conservation;Vasopneumatic Device;Joint Manipulations;Spinal Manipulations    Consulted and Agree with Plan of Care Patient           Patient will benefit from skilled therapeutic intervention in order to improve the following deficits and impairments:  Abnormal gait,Decreased mobility,Decreased activity tolerance,Decreased endurance,Decreased strength,Hypomobility,Impaired flexibility,Difficulty walking,Decreased safety awareness,Decreased balance  Visit Diagnosis: Other abnormalities of gait and mobility  Muscle weakness (generalized)     Problem List Patient Active Problem List   Diagnosis Date Noted  . Senile purpura ([place]) 05/03/2020  . Aortic atherosclerosis ([place]) 05/21/2019  . Fall at home, initial encounter 05/15/2019  . Right rib fracture 05/15/2019  . Hyperglycemia 04/28/2019  . Insect bite of left ankle 04/30/2017  . Gout 03/14/2017  . Vitamin D deficiency 03/14/2017  . BPH associated with nocturia 03/14/2017  . Hyperlipidemia 03/14/2017  . GERD (gastroesophageal reflux disease) 03/14/2017  . Peripheral polyneuropathy 02/16/2016  . Diplopia 11/19/2015   Lyndee Hensen, PT, DPT 11:17 AM  12/16/20    [facility] [place], [place], [address] Phone: [phone]   Fax:  [phone]  Name: Allen Robinson MRN: [id number] Date of Birth: 1925/12/05

## 2020-12-21 ENCOUNTER — Ambulatory Visit (INDEPENDENT_AMBULATORY_CARE_PROVIDER_SITE_OTHER): Payer: Medicare Other | Admitting: Physical Therapy

## 2020-12-21 ENCOUNTER — Other Ambulatory Visit: Payer: Self-pay

## 2020-12-21 ENCOUNTER — Encounter: Payer: Self-pay | Admitting: Physical Therapy

## 2020-12-21 DIAGNOSIS — R2689 Other abnormalities of gait and mobility: Secondary | ICD-10-CM

## 2020-12-21 DIAGNOSIS — M6281 Muscle weakness (generalized): Secondary | ICD-10-CM

## 2020-12-21 NOTE — Therapy (Signed)
[facility] [address] [place], [place], [address] Phone: [phone]   Fax:  [phone]  Physical Therapy Treatment/Re-Cert   Patient Details  Name: Allen Robinson MRN: [id number] Date of Birth: [birth year] Referring Provider (PT): Garret Reddish   Encounter Date: 12/21/2020   PT End of Session - 12/21/20 1247    Visit Number 9    Number of Visits 18    Date for PT Re-Evaluation 01/18/21    Authorization Type Medicare/ Re-cert done at visit 9    PT Start Time 0930    PT Stop Time 1013    PT Time Calculation (min) 43 min    Equipment Utilized During Treatment Gait belt    Activity Tolerance Patient tolerated treatment well    Behavior During Therapy [facility] for tasks assessed/performed           Past Medical History:  Diagnosis Date  . Arthritis    hands  . Chicken pox   . Hyperlipidemia   . Pneumonia   . Seasonal allergies     Past Surgical History:  Procedure Laterality Date  . TONSILLECTOMY     1936    There were no vitals filed for this visit.   Subjective Assessment - 12/21/20 1246    Subjective States he had a "bad day" on Saturday, felt his balance was "off", listing to the L.    Currently in Pain? No/denies              [facility] PT Assessment - 12/21/20 0001      Strength   Overall Strength Comments hips: 4/5, Knees: 4+/5      Dynamic Gait Index   Level Surface Normal    Change in Gait Speed Mild Impairment    Gait with Horizontal Head Turns Mild Impairment    Gait with Vertical Head Turns Mild Impairment    Gait and Pivot Turn Mild Impairment    Step Over Obstacle Moderate Impairment    Step Around Obstacles Mild Impairment    Steps Moderate Impairment    Total Score 15                         [facility] Adult PT Treatment/Exercise - 12/21/20 0001      Ambulation/Gait   Gait Comments 35 ft x 8 with practice for changing speedsand quick walk      Knee/Hip Exercises: Stretches    Active Hamstring Stretch --    Active Hamstring Stretch Limitations --    Gastroc Stretch 60 seconds;2 reps    Gastroc Stretch Limitations standing at counter      Knee/Hip Exercises: Aerobic   Recumbent Bike L1 x 8  min;      Knee/Hip Exercises: Standing   Forward Step Up 10 reps;Step Height: 6";Hand Hold: 1    Forward Step Up Limitations 4 and 6 height    Step Down 10 reps;Hand Hold: 0;Step Height: 4";Limitations    Step Down Limitations with SPC / practice for curbs    Stairs up/down 5 steps with 1 HR , recipricol, 6 in,  x 5 ;    Other Standing Knee Exercises AirEx: L/R weight shifts x 20;  Torso turns x 10, UE diagonal reaching x 10 bil;  Side stepping 20 ft x 4;      Knee/Hip Exercises: Seated   Long Arc Quad --   Regular chair   Sit to [employer] 10 reps  PT Education - 12/21/20 1247    Education Details Reviewed HEP    Person(s) Educated Patient    Methods Explanation;Demonstration;Tactile cues;Verbal cues    Comprehension Verbalized understanding;Returned demonstration;Verbal cues required;Tactile cues required;Need further instruction            PT Short Term Goals - 12/21/20 1248      PT SHORT TERM GOAL #1   Title Pt to be independent wtih initial HEP    Time 2    Period Weeks    Status Achieved    Target Date 11/24/20      PT SHORT TERM GOAL #2   Title Pt to demo proper use of SPC, and report using at least 75 % of time.    Baseline is using outdoors    Time 2    Period Weeks    Status Partially Met    Target Date 11/24/20             PT Long Term Goals - 12/21/20 1248      PT LONG TERM GOAL #1   Title Pt to be independent with final HEP    Time 6    Period Weeks    Status Partially Met      PT LONG TERM GOAL #2   Title Pt to demo safe use of SPC at all times, to improve safety and fall risk    Time 6    Period Weeks    Status Partially Met      PT LONG TERM GOAL #3   Title Pt to demo improved score on BERG by at  least 3 points.    Time 6    Period Weeks    Status Partially Met      PT LONG TERM GOAL #4   Title Pt to demo safe ability for stair negotiation with 1 hand rail, as well as outdoor curb using SPC, for improved safety with community activity    Time 4    Period Weeks    Status New    Target Date 01/18/21      PT LONG TERM GOAL #5   Title Pt to demo Score on DGI to be at least 17    Baseline 1/5: DGI: 9      2/1: DGI: 15    Time 4    Period Weeks    Status New    Target Date 01/18/21                 Plan - 12/21/20 1258    Clinical Impression Statement Pt has been seen for 9 visits, making progress toward LTGs. Does show improvments in dynamic balance, however still challenged with most activities in sessions. Pt with difficlty and decreased confidence on stairs. Will benefit from outdoor practice on curb with cane next visit. Much improved ability with use of handrail. Pt to benefit from conitnued care to improve stability, balance, safety and strength, and to meet LTGs.    Personal Factors and Comorbidities Age    Examination-Activity Limitations Stand;Lift;Locomotion Level;Bend;Reach Overhead;Transfers;Carry;Squat;Dressing;Stairs    Examination-Participation Restrictions Cleaning;Community Activity;Laundry;Shop    Stability/Clinical Decision Making Stable/Uncomplicated    Rehab Potential Good    PT Frequency 2x / week    PT Duration 6 weeks    PT Treatment/Interventions ADLs/Self Care Home Management;Cryotherapy;Electrical Stimulation;Iontophoresis 44m/ml Dexamethasone;Moist Heat;Ultrasound;Therapeutic exercise;Therapeutic activities;Functional mobility training;Stair training;Gait training;Balance training;Neuromuscular re-education;Patient/family education;Orthotic Fit/Training;Manual techniques;Passive range of motion;Dry needling;Energy conservation;Vasopneumatic Device;Joint Manipulations;Spinal Manipulations    Consulted and Agree with  Plan of Care Patient            Patient will benefit from skilled therapeutic intervention in order to improve the following deficits and impairments:  Abnormal gait,Decreased mobility,Decreased activity tolerance,Decreased endurance,Decreased strength,Hypomobility,Impaired flexibility,Difficulty walking,Decreased safety awareness,Decreased balance  Visit Diagnosis: Other abnormalities of gait and mobility  Muscle weakness (generalized)     Problem List Patient Active Problem List   Diagnosis Date Noted  . Senile purpura ([place]) 05/03/2020  . Aortic atherosclerosis ([place]) 05/21/2019  . Fall at home, initial encounter 05/15/2019  . Right rib fracture 05/15/2019  . Hyperglycemia 04/28/2019  . Insect bite of left ankle 04/30/2017  . Gout 03/14/2017  . Vitamin D deficiency 03/14/2017  . BPH associated with nocturia 03/14/2017  . Hyperlipidemia 03/14/2017  . GERD (gastroesophageal reflux disease) 03/14/2017  . Peripheral polyneuropathy 02/16/2016  . Diplopia 11/19/2015    Lyndee Hensen, PT, DPT 1:01 PM  12/21/20    [facility] [place], [place], [address] Phone: [phone]   Fax:  [phone]  Name: Allen Robinson MRN: [id number] Date of Birth: 1926/08/10

## 2020-12-23 ENCOUNTER — Ambulatory Visit (INDEPENDENT_AMBULATORY_CARE_PROVIDER_SITE_OTHER): Payer: Medicare Other | Admitting: Physical Therapy

## 2020-12-23 ENCOUNTER — Encounter: Payer: Self-pay | Admitting: Physical Therapy

## 2020-12-23 ENCOUNTER — Other Ambulatory Visit: Payer: Self-pay

## 2020-12-23 DIAGNOSIS — M6281 Muscle weakness (generalized): Secondary | ICD-10-CM

## 2020-12-23 DIAGNOSIS — R2689 Other abnormalities of gait and mobility: Secondary | ICD-10-CM | POA: Diagnosis not present

## 2020-12-25 ENCOUNTER — Encounter: Payer: Self-pay | Admitting: Physical Therapy

## 2020-12-25 NOTE — Therapy (Signed)
[facility] [address] [place], [place], [address] Phone: [phone]   Fax:  [phone]  Physical Therapy Treatment  Patient Details  Name: Allen Robinson MRN: [id number] Date of Birth: 01-08-1926 Referring Provider (PT): Garret Reddish   Encounter Date: 12/23/2020   PT End of Session - 12/25/20 1610    Visit Number 10    Number of Visits 18    Date for PT Re-Evaluation 01/18/21    Authorization Type Medicare/ Re-cert done at visit 9    PT Start Time 0930    PT Stop Time 1014    PT Time Calculation (min) 44 min    Equipment Utilized During Treatment Gait belt    Activity Tolerance Patient tolerated treatment well    Behavior During Therapy [facility] for tasks assessed/performed           Past Medical History:  Diagnosis Date  . Arthritis    hands  . Chicken pox   . Hyperlipidemia   . Pneumonia   . Seasonal allergies     Past Surgical History:  Procedure Laterality Date  . TONSILLECTOMY     1936    There were no vitals filed for this visit.   Subjective Assessment - 12/25/20 1610    Subjective Pt with no new complaints.    Currently in Pain? No/denies                             [facility] Adult PT Treatment/Exercise - 12/25/20 0001      Knee/Hip Exercises: Stretches   Active Hamstring Stretch 3 reps;30 seconds    Active Hamstring Stretch Limitations seated    Gastroc Stretch 3 reps;30 seconds    Gastroc Stretch Limitations long sitting with towel      Knee/Hip Exercises: Aerobic   Recumbent Bike L1 x 8  min;      Knee/Hip Exercises: Standing   Heel Raises 20 reps    Hip Flexion 20 reps    Stairs up/down 5 steps with 1 HR , recipricol, 6 in,  x 5 ;    Other Standing Knee Exercises Walk/March in hallway 15 ft x 4; with 1 UE support; Tandem stance 30 sec x 3 bil;    Other Standing Knee Exercises Side stepping and bwd walking 20 ft x 4;      Knee/Hip Exercises: Seated   Sit to Sand 10  reps                    PT Short Term Goals - 12/21/20 1248      PT SHORT TERM GOAL #1   Title Pt to be independent wtih initial HEP    Time 2    Period Weeks    Status Achieved    Target Date 11/24/20      PT SHORT TERM GOAL #2   Title Pt to demo proper use of SPC, and report using at least 75 % of time.    Baseline is using outdoors    Time 2    Period Weeks    Status Partially Met    Target Date 11/24/20             PT Long Term Goals - 12/21/20 1248      PT LONG TERM GOAL #1   Title Pt to be independent with final HEP    Time 6    Period Weeks  Status Partially Met      PT LONG TERM GOAL #2   Title Pt to demo safe use of SPC at all times, to improve safety and fall risk    Time 6    Period Weeks    Status Partially Met      PT LONG TERM GOAL #3   Title Pt to demo improved score on BERG by at least 3 points.    Time 6    Period Weeks    Status Partially Met      PT LONG TERM GOAL #4   Title Pt to demo safe ability for stair negotiation with 1 hand rail, as well as outdoor curb using SPC, for improved safety with community activity    Time 4    Period Weeks    Status New    Target Date 01/18/21      PT LONG TERM GOAL #5   Title Pt to demo Score on DGI to be at least 17    Baseline 1/5: DGI: 9      2/1: DGI: 15    Time 4    Period Weeks    Status New    Target Date 01/18/21                 Plan - 12/25/20 1616    Clinical Impression Statement Pt with improving ability for direction changes and slower speed. He continues to require UE support on stairs for stability. Plan to progress dynamic balance as able.    Personal Factors and Comorbidities Age    Examination-Activity Limitations Stand;Lift;Locomotion Level;Bend;Reach Overhead;Transfers;Carry;Squat;Dressing;Stairs    Examination-Participation Restrictions Cleaning;Community Activity;Laundry;Shop    Stability/Clinical Decision Making Stable/Uncomplicated    Rehab Potential  Good    PT Frequency 2x / week    PT Duration 6 weeks    PT Treatment/Interventions ADLs/Self Care Home Management;Cryotherapy;Electrical Stimulation;Iontophoresis 5m/ml Dexamethasone;Moist Heat;Ultrasound;Therapeutic exercise;Therapeutic activities;Functional mobility training;Stair training;Gait training;Balance training;Neuromuscular re-education;Patient/family education;Orthotic Fit/Training;Manual techniques;Passive range of motion;Dry needling;Energy conservation;Vasopneumatic Device;Joint Manipulations;Spinal Manipulations    Consulted and Agree with Plan of Care Patient           Patient will benefit from skilled therapeutic intervention in order to improve the following deficits and impairments:  Abnormal gait,Decreased mobility,Decreased activity tolerance,Decreased endurance,Decreased strength,Hypomobility,Impaired flexibility,Difficulty walking,Decreased safety awareness,Decreased balance  Visit Diagnosis: Other abnormalities of gait and mobility  Muscle weakness (generalized)     Problem List Patient Active Problem List   Diagnosis Date Noted  . Senile purpura ([facility] 05/03/2020  . [facility] ([facility] 05/21/2019  . Fall at home, initial encounter 05/15/2019  . Right rib fracture 05/15/2019  . Hyperglycemia 04/28/2019  . Insect bite of left ankle 04/30/2017  . Gout 03/14/2017  . Vitamin D deficiency 03/14/2017  . BPH associated with nocturia 03/14/2017  . Hyperlipidemia 03/14/2017  . GERD (gastroesophageal reflux disease) 03/14/2017  . Peripheral polyneuropathy 02/16/2016  . Diplopia 11/19/2015    LLyndee Hensen PT, DPT 4:18 PM  12/25/20    [place] [address]Phone: [phone]  Fax:  [phone] Name: Allen MacomberMRN: [id number] of Birth: 3Dec 30, 1927

## 2020-12-28 ENCOUNTER — Ambulatory Visit (INDEPENDENT_AMBULATORY_CARE_PROVIDER_SITE_OTHER): Payer: Medicare Other | Admitting: Physical Therapy

## 2020-12-28 ENCOUNTER — Encounter: Payer: Self-pay | Admitting: Physical Therapy

## 2020-12-28 ENCOUNTER — Other Ambulatory Visit: Payer: Self-pay

## 2020-12-28 DIAGNOSIS — R2689 Other abnormalities of gait and mobility: Secondary | ICD-10-CM

## 2020-12-28 DIAGNOSIS — M6281 Muscle weakness (generalized): Secondary | ICD-10-CM

## 2020-12-28 NOTE — Therapy (Signed)
[facility] [address] [place], [place], [address] Phone: [phone]   Fax:  [phone]  Physical Therapy Treatment  Patient Details  Name: Allen Robinson MRN: [id number] Date of Birth: January 21, 1926 Referring Provider (PT): Garret Reddish   Encounter Date: 12/28/2020   PT End of Session - 12/28/20 1140    Visit Number 11    Number of Visits 18    Date for PT Re-Evaluation 01/18/21    Authorization Type Medicare/ Re-cert done at visit 9    PT Start Time 0935    PT Stop Time 1018    PT Time Calculation (min) 43 min    Equipment Utilized During Treatment Gait belt    Activity Tolerance Patient tolerated treatment well    Behavior During Therapy [facility] for tasks assessed/performed           Past Medical History:  Diagnosis Date  . Arthritis    hands  . Chicken pox   . Hyperlipidemia   . Pneumonia   . Seasonal allergies     Past Surgical History:  Procedure Laterality Date  . TONSILLECTOMY     1936    There were no vitals filed for this visit.   Subjective Assessment - 12/28/20 1139    Subjective Pt reports a fall in his garage, caught toe on rug/runner. States no injuries, was able to get hiimself up.    Currently in Pain? No/denies                                       PT Short Term Goals - 12/21/20 1248      PT SHORT TERM GOAL #1   Title Pt to be independent wtih initial HEP    Time 2    Period Weeks    Status Achieved    Target Date 11/24/20      PT SHORT TERM GOAL #2   Title Pt to demo proper use of SPC, and report using at least 75 % of time.    Baseline is using outdoors    Time 2    Period Weeks    Status Partially Met    Target Date 11/24/20             PT Long Term Goals - 12/21/20 1248      PT LONG TERM GOAL #1   Title Pt to be independent with final HEP    Time 6    Period Weeks    Status Partially Met      PT LONG TERM GOAL #2   Title Pt to demo  safe use of SPC at all times, to improve safety and fall risk    Time 6    Period Weeks    Status Partially Met      PT LONG TERM GOAL #3   Title Pt to demo improved score on BERG by at least 3 points.    Time 6    Period Weeks    Status Partially Met      PT LONG TERM GOAL #4   Title Pt to demo safe ability for stair negotiation with 1 hand rail, as well as outdoor curb using [facility], for improved safety with community activity    Time 4    Period Weeks    Status New    Target Date 01/18/21      PT LONG  TERM GOAL #5   Title Pt to demo Score on DGI to be at least 17    Baseline 1/5: DGI: 9      2/1: DGI: 15    Time 4    Period Weeks    Status New    Target Date 01/18/21                 Plan - 12/28/20 1145    Clinical Impression Statement Pt did very well today with dynamic balance. Improving with ability for head turns, stepping over cones, and changing directions. Practiced outside on curb with SPC, pt with imprving safety with this. Still challenged with 6 in step height with no UE support. Pt doing well in sessions, but continues to have falls intermittently at home. Plan to work towards d/c in next 1-2 weeks.    Personal Factors and Comorbidities Age    Examination-Activity Limitations Stand;Lift;Locomotion Level;Bend;Reach Overhead;Transfers;Carry;Squat;Dressing;Stairs    Examination-Participation Restrictions Cleaning;Community Activity;Laundry;Shop    Stability/Clinical Decision Making Stable/Uncomplicated    Rehab Potential Good    PT Frequency 2x / week    PT Duration 6 weeks    PT Treatment/Interventions ADLs/Self Care Home Management;Cryotherapy;Electrical Stimulation;Iontophoresis 15m/ml Dexamethasone;Moist Heat;Ultrasound;Therapeutic exercise;Therapeutic activities;Functional mobility training;Stair training;Gait training;Balance training;Neuromuscular re-education;Patient/family education;Orthotic Fit/Training;Manual techniques;Passive range of motion;Dry  needling;Energy conservation;Vasopneumatic Device;Joint Manipulations;Spinal Manipulations    Consulted and Agree with Plan of Care Patient           Patient will benefit from skilled therapeutic intervention in order to improve the following deficits and impairments:  Abnormal gait,Decreased mobility,Decreased activity tolerance,Decreased endurance,Decreased strength,Hypomobility,Impaired flexibility,Difficulty walking,Decreased safety awareness,Decreased balance  Visit Diagnosis: Other abnormalities of gait and mobility  Muscle weakness (generalized)     Problem List Patient Active Problem List   Diagnosis Date Noted  . Senile purpura ([facility] 05/03/2020  . Aortic atherosclerosis ([facility] [place] 05/21/2019  . Fall at home, initial encounter 05/15/2019  . Right rib fracture 05/15/2019  . Hyperglycemia 04/28/2019  . Insect bite of left ankle 04/30/2017  . Gout 03/14/2017  . Vitamin D deficiency 03/14/2017  . BPH associated with nocturia 03/14/2017  . Hyperlipidemia 03/14/2017  . GERD (gastroesophageal reflux disease) 03/14/2017  . Peripheral polyneuropathy 02/16/2016  . Diplopia 11/19/2015    LLyndee Hensen PT, DPT 11:48 AM  12/28/20    [facility] [address] [place] [address]Phone: [phone]  Fax:  [phone] Name: Allen WeilMRN: [id number] of Birth: 3Jan 10, 1927

## 2020-12-30 ENCOUNTER — Ambulatory Visit (INDEPENDENT_AMBULATORY_CARE_PROVIDER_SITE_OTHER): Payer: Medicare Other | Admitting: Physical Therapy

## 2020-12-30 ENCOUNTER — Other Ambulatory Visit: Payer: Self-pay

## 2020-12-30 DIAGNOSIS — M6281 Muscle weakness (generalized): Secondary | ICD-10-CM | POA: Diagnosis not present

## 2020-12-30 DIAGNOSIS — R2689 Other abnormalities of gait and mobility: Secondary | ICD-10-CM

## 2020-12-30 NOTE — Patient Instructions (Signed)
Access Code: [email or web address] URL: [email or web address] Date: 12/30/2020 Prepared by: Lyndee Hensen  Exercises Standing Gastroc Stretch - 2 x daily - 3 reps - 20 hold Seated Knee Extension AROM - 1 x daily - 2 sets - 10 reps Seated Heel Raise - 1 x daily - 2 sets - 10 reps Standing March with Counter Support - 1 x daily - 2 sets - 10 reps Standing Hip Abduction with Counter Support - 1 x daily - 2 sets - 10 reps Heel rises with counter support - 1 x daily - 2 sets - 10 reps Side Stepping with Counter Support - 1 x daily - 5 reps Standing Tandem Balance with Counter Support - 1 x daily - 3 reps - 30 hold Standing Weight Shift Side to Side - 1 x daily - 2 sets - 10 reps Standing Balance in Corner - 1 x daily - 1 sets - 10 reps

## 2020-12-31 ENCOUNTER — Encounter: Payer: Self-pay | Admitting: Physical Therapy

## 2020-12-31 NOTE — Therapy (Signed)
[facility] [address] [place], [place], [address] Phone: [phone]   Fax:  [phone]  Physical Therapy Treatment  Patient Details  Name: Allen Robinson MRN: [id number] Date of Birth: 09-02-1926 Referring Provider (PT): Garret Reddish   Encounter Date: 12/30/2020   PT End of Session - 12/31/20 1506    Visit Number 12    Number of Visits 18    Date for PT Re-Evaluation 01/18/21    Authorization Type Medicare/ Re-cert done at visit 9    PT Start Time 0938    PT Stop Time 1016    PT Time Calculation (min) 38 min    Equipment Utilized During Treatment Gait belt    Activity Tolerance Patient tolerated treatment well    Behavior During Therapy [facility] for tasks assessed/performed           Past Medical History:  Diagnosis Date  . Arthritis    hands  . Chicken pox   . Hyperlipidemia   . Pneumonia   . Seasonal allergies     Past Surgical History:  Procedure Laterality Date  . TONSILLECTOMY     1936    There were no vitals filed for this visit.   Subjective Assessment - 12/31/20 1506    Subjective Pt with no new complaints.    Currently in Pain? No/denies                             [facility] Adult PT Treatment/Exercise - 12/31/20 0001      Transfers   Comments floor transfers x 2;      Knee/Hip Exercises: Standing   Heel Raises 20 reps    Hip Flexion 20 reps    Hip Abduction 20 reps;Both    Forward Step Up 5 reps;Hand Hold: 0;Step Height: 6"    Other Standing Knee Exercises Corner balance: tandem stance, L/R and A/P weight shifts, L/R and up/down head turns, torso turns, all x10 each; with education for HEP.    Other Standing Knee Exercises Side stepping and bwd walking 20 ft x 4;                  PT Education - 12/31/20 1506    Education Details Final HEP reviewed    Person(s) Educated Patient    Methods Explanation;Demonstration;Tactile cues;Verbal cues;Handout     Comprehension Returned demonstration;Verbal cues required;Verbalized understanding;Tactile cues required;Need further instruction            PT Short Term Goals - 12/21/20 1248      PT SHORT TERM GOAL #1   Title Pt to be independent wtih initial HEP    Time 2    Period Weeks    Status Achieved    Target Date 11/24/20      PT SHORT TERM GOAL #2   Title Pt to demo proper use of SPC, and report using at least 75 % of time.    Baseline is using outdoors    Time 2    Period Weeks    Status Partially Met    Target Date 11/24/20             PT Long Term Goals - 12/21/20 1248      PT LONG TERM GOAL #1   Title Pt to be independent with final HEP    Time 6    Period Weeks    Status Partially Met  PT LONG TERM GOAL #2   Title Pt to demo safe use of SPC at all times, to improve safety and fall risk    Time 6    Period Weeks    Status Partially Met      PT LONG TERM GOAL #3   Title Pt to demo improved score on BERG by at least 3 points.    Time 6    Period Weeks    Status Partially Met      PT LONG TERM GOAL #4   Title Pt to demo safe ability for stair negotiation with 1 hand rail, as well as outdoor curb using SPC, for improved safety with community activity    Time 4    Period Weeks    Status New    Target Date 01/18/21      PT LONG TERM GOAL #5   Title Pt to demo Score on DGI to be at least 17    Baseline 1/5: DGI: 9      2/1: DGI: 15    Time 4    Period Weeks    Status New    Target Date 01/18/21                 Plan - 12/31/20 1513    Clinical Impression Statement Pt showing good improvmets with dynamic gait. Still challenged with tandem stance and stairs. Educated on corner balance for safe practice at home. Pt concerned with floor transfer, and requsted to practice due to recent falls, but pt did very well getting up without UE support from mat table or chair. Will continue to progress stability and safety awareness.    Personal Factors and  Comorbidities Age    Examination-Activity Limitations Stand;Lift;Locomotion Level;Bend;Reach Overhead;Transfers;Carry;Squat;Dressing;Stairs    Examination-Participation Restrictions Cleaning;Community Activity;Laundry;Shop    Stability/Clinical Decision Making Stable/Uncomplicated    Rehab Potential Good    PT Frequency 2x / week    PT Duration 6 weeks    PT Treatment/Interventions ADLs/Self Care Home Management;Cryotherapy;Electrical Stimulation;Iontophoresis 26m/ml Dexamethasone;Moist Heat;Ultrasound;Therapeutic exercise;Therapeutic activities;Functional mobility training;Stair training;Gait training;Balance training;Neuromuscular re-education;Patient/family education;Orthotic Fit/Training;Manual techniques;Passive range of motion;Dry needling;Energy conservation;Vasopneumatic Device;Joint Manipulations;Spinal Manipulations    Consulted and Agree with Plan of Care Patient           Patient will benefit from skilled therapeutic intervention in order to improve the following deficits and impairments:  Abnormal gait,Decreased mobility,Decreased activity tolerance,Decreased endurance,Decreased strength,Hypomobility,Impaired flexibility,Difficulty walking,Decreased safety awareness,Decreased balance  Visit Diagnosis: Other abnormalities of gait and mobility  Muscle weakness (generalized)     Problem List Patient Active Problem List   Diagnosis Date Noted  . Senile purpura ([facility] 05/03/2020  . Aortic atherosclerosis ([place] 05/21/2019  . Fall at home, initial encounter 05/15/2019  . Right rib fracture 05/15/2019  . Hyperglycemia 04/28/2019  . Insect bite of left ankle 04/30/2017  . Gout 03/14/2017  . Vitamin D deficiency 03/14/2017  . BPH associated with nocturia 03/14/2017  . Hyperlipidemia 03/14/2017  . GERD (gastroesophageal reflux disease) 03/14/2017  . Peripheral polyneuropathy 02/16/2016  . Diplopia 11/19/2015    LLyndee Hensen PT, DPT 3:22 PM  12/31/20    [facility]  [place] [address]Phone: [phone]  Fax:  [phone] Name: Allen OuchMRN: [id number] of Birth: 307/08/1926

## 2021-01-03 ENCOUNTER — Encounter: Payer: Medicare Other | Admitting: Physical Therapy

## 2021-01-04 DIAGNOSIS — H10413 Chronic giant papillary conjunctivitis, bilateral: Secondary | ICD-10-CM | POA: Diagnosis not present

## 2021-01-04 DIAGNOSIS — Z961 Presence of intraocular lens: Secondary | ICD-10-CM | POA: Diagnosis not present

## 2021-01-04 DIAGNOSIS — H5203 Hypermetropia, bilateral: Secondary | ICD-10-CM | POA: Diagnosis not present

## 2021-01-06 ENCOUNTER — Encounter: Payer: Medicare Other | Admitting: Physical Therapy

## 2021-01-11 ENCOUNTER — Other Ambulatory Visit: Payer: Self-pay

## 2021-01-11 ENCOUNTER — Ambulatory Visit (INDEPENDENT_AMBULATORY_CARE_PROVIDER_SITE_OTHER): Payer: Medicare Other | Admitting: Physical Therapy

## 2021-01-11 DIAGNOSIS — M6281 Muscle weakness (generalized): Secondary | ICD-10-CM

## 2021-01-11 DIAGNOSIS — R2689 Other abnormalities of gait and mobility: Secondary | ICD-10-CM

## 2021-01-13 ENCOUNTER — Other Ambulatory Visit: Payer: Self-pay

## 2021-01-13 ENCOUNTER — Ambulatory Visit (INDEPENDENT_AMBULATORY_CARE_PROVIDER_SITE_OTHER): Payer: Medicare Other | Admitting: Physical Therapy

## 2021-01-13 ENCOUNTER — Encounter: Payer: Self-pay | Admitting: Physical Therapy

## 2021-01-13 DIAGNOSIS — R2689 Other abnormalities of gait and mobility: Secondary | ICD-10-CM

## 2021-01-13 DIAGNOSIS — M6281 Muscle weakness (generalized): Secondary | ICD-10-CM | POA: Diagnosis not present

## 2021-01-13 NOTE — Patient Instructions (Signed)
Access Code: [email or web address] URL: [email or web address] Date: 01/13/2021 Prepared by: Lyndee Hensen  Exercises Standing Gastroc Stretch - 2 x daily - 3 reps - 20 hold Seated Knee Extension AROM - 1 x daily - 2 sets - 10 reps Sit to Stand - 1 x daily - 1-2 sets - 10 reps Heel rises with counter support - 1 x daily - 2 sets - 10 reps Standing March with Counter Support - 1 x daily - 2 sets - 10 reps Standing Hip Abduction with Counter Support - 1 x daily - 2 sets - 10 reps Side Stepping with Counter Support - 1 x daily - 5 reps Step Up - 1 x daily - 1 sets - 10 reps Standing Tandem Balance with Counter Support - 1 x daily - 3 reps - 30 hold Standing Weight Shift Side to Side - 1 x daily - 2 sets - 10 reps Standing Balance in Corner - 1 x daily - 1 sets - 10 reps

## 2021-01-15 ENCOUNTER — Encounter: Payer: Self-pay | Admitting: Physical Therapy

## 2021-01-15 NOTE — Therapy (Signed)
[facility] [address] [place], [place], [address] Phone: [phone]   Fax:  [phone]  Physical Therapy Treatment  Patient Details  Name: Allen Robinson MRN: [id number] Date of Birth: Sep 30, 1926 Referring Provider (PT): Garret Reddish   Encounter Date: 01/11/2021   PT End of Session - 01/15/21 1334    Visit Number 13    Number of Visits 18    Date for PT Re-Evaluation 01/18/21    Authorization Type Medicare/ Re-cert done at visit 9    PT Start Time 0932    PT Stop Time 1015    PT Time Calculation (min) 43 min    Equipment Utilized During Treatment Gait belt    Activity Tolerance Patient tolerated treatment well    Behavior During Therapy [facility] for tasks assessed/performed           Past Medical History:  Diagnosis Date  . Arthritis    hands  . Chicken pox   . Hyperlipidemia   . Pneumonia   . Seasonal allergies     Past Surgical History:  Procedure Laterality Date  . TONSILLECTOMY     1936    There were no vitals filed for this visit.   Subjective Assessment - 01/15/21 1334    Subjective Pt reports no new falls, has been doing HEP    Currently in Pain? No/denies                             [facility] Adult PT Treatment/Exercise - 01/15/21 0001      Knee/Hip Exercises: Aerobic   Recumbent Bike L1 x 8  min;      Knee/Hip Exercises: Standing   Heel Raises 20 reps    Hip Flexion 20 reps    Forward Step Up 5 reps;Hand Hold: 0;Step Height: 6"    Stairs up/down 5 steps with 1 HR , recipricol, 6 in,  x 5 ;    Other Standing Knee Exercises Corner balance: tandem stance, L/R and A/P weight shifts, L/R and up/down head turns, torso turns, all x10 each;   fwd stepping with reach, same side x 10 bil; Tandem stance 30 sec x 2 bil;    Other Standing Knee Exercises Side stepping and bwd walking 20 ft x 6;      Knee/Hip Exercises: Seated   Sit to Sand 15 reps                    PT Short  Term Goals - 12/21/20 1248      PT SHORT TERM GOAL #1   Title Pt to be independent wtih initial HEP    Time 2    Period Weeks    Status Achieved    Target Date 11/24/20      PT SHORT TERM GOAL #2   Title Pt to demo proper use of SPC, and report using at least 75 % of time.    Baseline is using outdoors    Time 2    Period Weeks    Status Partially Met    Target Date 11/24/20             PT Long Term Goals - 12/21/20 1248      PT LONG TERM GOAL #1   Title Pt to be independent with final HEP    Time 6    Period Weeks    Status Partially Met  PT LONG TERM GOAL #2   Title Pt to demo safe use of SPC at all times, to improve safety and fall risk    Time 6    Period Weeks    Status Partially Met      PT LONG TERM GOAL #3   Title Pt to demo improved score on BERG by at least 3 points.    Time 6    Period Weeks    Status Partially Met      PT LONG TERM GOAL #4   Title Pt to demo safe ability for stair negotiation with 1 hand rail, as well as outdoor curb using SPC, for improved safety with community activity    Time 4    Period Weeks    Status New    Target Date 01/18/21      PT LONG TERM GOAL #5   Title Pt to demo Score on DGI to be at least 17    Baseline 1/5: DGI: 9      2/1: DGI: 15    Time 4    Period Weeks    Status New    Target Date 01/18/21                 Plan - 01/15/21 1336    Clinical Impression Statement Pt progressing well with dynamic balance. Pt reports not feeling as confident as he did in the past (a year or so ago) with his balance, but is showing good improvments, and has not had any lob during sessions lately. Pt progressing towards goals, will d/c next visit.    Personal Factors and Comorbidities Age    Examination-Activity Limitations Stand;Lift;Locomotion Level;Bend;Reach Overhead;Transfers;Carry;Squat;Dressing;Stairs    Examination-Participation Restrictions Cleaning;Community Activity;Laundry;Shop    Stability/Clinical  Decision Making Stable/Uncomplicated    Rehab Potential Good    PT Frequency 2x / week    PT Duration 6 weeks    PT Treatment/Interventions ADLs/Self Care Home Management;Cryotherapy;Electrical Stimulation;Iontophoresis 79m/ml Dexamethasone;Moist Heat;Ultrasound;Therapeutic exercise;Therapeutic activities;Functional mobility training;Stair training;Gait training;Balance training;Neuromuscular re-education;Patient/family education;Orthotic Fit/Training;Manual techniques;Passive range of motion;Dry needling;Energy conservation;Vasopneumatic Device;Joint Manipulations;Spinal Manipulations    Consulted and Agree with Plan of Care Patient           Patient will benefit from skilled therapeutic intervention in order to improve the following deficits and impairments:  Abnormal gait,Decreased mobility,Decreased activity tolerance,Decreased endurance,Decreased strength,Hypomobility,Impaired flexibility,Difficulty walking,Decreased safety awareness,Decreased balance  Visit Diagnosis: Other abnormalities of gait and mobility  Muscle weakness (generalized)     Problem List Patient Active Problem List   Diagnosis Date Noted  . Senile purpura ([place] 05/03/2020  . Aortic atherosclerosis ([place] 05/21/2019  . Fall at home, initial encounter 05/15/2019  . Right rib fracture 05/15/2019  . Hyperglycemia 04/28/2019  . Insect bite of left ankle 04/30/2017  . Gout 03/14/2017  . Vitamin D deficiency 03/14/2017  . BPH associated with nocturia 03/14/2017  . Hyperlipidemia 03/14/2017  . GERD (gastroesophageal reflux disease) 03/14/2017  . Peripheral polyneuropathy 02/16/2016  . Diplopia 11/19/2015    LLyndee Hensen PT, DPT 1:39 PM  01/15/21    [place] [address]Phone: [phone]  Fax:  [phone] Name: WGarvey WestcottMRN: [id number] of Birth: 329-Dec-1927

## 2021-01-15 NOTE — Therapy (Signed)
[facility] [address] [place], [place], [address] Phone: [phone]   Fax:  [phone]  Physical Therapy Treatment/Discharged   Patient Details  Name: Allen Robinson MRN: [id number] Date of Birth: Apr 12, 1926 Referring Provider (PT): Garret Reddish   Encounter Date: 01/13/2021   PT End of Session - 01/15/21 1344    Visit Number 14    Number of Visits 18    Date for PT Re-Evaluation 01/18/21    Authorization Type Medicare/ Re-cert done at visit 9    PT Start Time 0847    PT Stop Time 0929    PT Time Calculation (min) 42 min    Equipment Utilized During Treatment Gait belt    Activity Tolerance Patient tolerated treatment well    Behavior During Therapy [facility] for tasks assessed/performed           Past Medical History:  Diagnosis Date  . Arthritis    hands  . Chicken pox   . Hyperlipidemia   . Pneumonia   . Seasonal allergies     Past Surgical History:  Procedure Laterality Date  . TONSILLECTOMY     1936    There were no vitals filed for this visit.   Subjective Assessment - 01/15/21 1344    Subjective Pt with no new complaints.    Currently in Pain? No/denies              [facility] PT Assessment - 01/15/21 0001      Berg Balance Test   Sit to Stand Able to stand without using hands and stabilize independently    Standing Unsupported Able to stand safely 2 minutes    Sitting with Back Unsupported but Feet Supported on Floor or Stool Able to sit safely and securely 2 minutes    Stand to Sit Sits safely with minimal use of hands    Transfers Able to transfer safely, minor use of hands    Standing Unsupported with Eyes Closed Able to stand 10 seconds safely    Standing Unsupported with Feet Together Able to place feet together independently and stand for 1 minute with supervision    From Standing, Reach Forward with Outstretched Arm Can reach confidently >25 cm (10")    From Standing Position, Pick up  Object from Floor Able to pick up shoe safely and easily    From Standing Position, Turn to Look Behind Over each Shoulder Looks behind one side only/other side shows less weight shift    Turn 360 Degrees Able to turn 360 degrees safely one side only in 4 seconds or less    Standing Unsupported, Alternately Place Feet on Step/Stool Able to stand independently and complete 8 steps >20 seconds    Standing Unsupported, One Foot in Front Able to take small step independently and hold 30 seconds    Standing on One Leg Able to lift leg independently and hold equal to or more than 3 seconds    Total Score 48      Dynamic Gait Index   Level Surface Normal    Change in Gait Speed Normal    Gait with Horizontal Head Turns Mild Impairment    Gait with Vertical Head Turns Normal    Gait and Pivot Turn Normal    Step Over Obstacle Mild Impairment    Step Around Obstacles Normal    Steps Mild Impairment    Total Score 21  [place] Adult PT Treatment/Exercise - 01/15/21 1354      Knee/Hip Exercises: Aerobic   Recumbent Bike L1 x 8  min;      Knee/Hip Exercises: Standing   Heel Raises 20 reps    Hip Flexion 20 reps    Forward Step Up 5 reps;Hand Hold: 0;Step Height: 6"    Other Standing Knee Exercises Corner balance: tandem stance, L/R and A/P weight shifts, L/R and up/down head turns, torso turns, all x10 each;   Tandem stance 30 sec x 2 bil;    Other Standing Knee Exercises Side stepping and bwd walking 20 ft x 6;      Knee/Hip Exercises: Seated   Long Arc Quad 20 reps    Long Arc Quad Weight 4 lbs.                  PT Education - 01/15/21 1344    Education Details Final HEP reviewd in detial.    Person(s) Educated Patient    Methods Explanation;Demonstration;Tactile cues;Verbal cues;Handout    Comprehension Verbalized understanding;Returned demonstration;Verbal cues required;Tactile cues required            PT Short Term Goals - 01/15/21  1345      PT SHORT TERM GOAL #1   Title Pt to be independent wtih initial HEP    Time 2    Period Weeks    Status Achieved    Target Date 11/24/20      PT SHORT TERM GOAL #2   Title Pt to demo proper use of SPC, and report using at least 75 % of time.    Baseline is using outdoors    Time 2    Period Weeks    Status Achieved    Target Date 11/24/20             PT Long Term Goals - 01/15/21 1345      PT LONG TERM GOAL #1   Title Pt to be independent with final HEP    Time 6    Period Weeks    Status Achieved      PT LONG TERM GOAL #2   Title Pt to demo safe use of SPC at all times, to improve safety and fall risk    Time 6    Period Weeks    Status Achieved      PT LONG TERM GOAL #3   Title Pt to demo improved score on BERG by at least 3 points.    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 Improved scores on BERG and DGI, as well as improved safety awareness with all dynamic activity. Pt  with improved ability and safety on stairs and curbs. He does still require use of 1 handrail for safety. Pt with reduced fall risk since evaluation. Recommended he continue to use [facility] for outdoor and longer distance walking. Final HEP reviewed in detail today for strength and balance.    Personal Factors and Comorbidities Age    Examination-Activity Limitations Stand;Lift;Locomotion Level;Bend;Reach Overhead;Transfers;Carry;Squat;Dressing;Stairs    Examination-Participation Restrictions  Cleaning;Community Activity;Laundry;Shop    Stability/Clinical Decision Making Stable/Uncomplicated    Rehab Potential Good    PT Frequency 2x / week    PT Duration 6 weeks    PT Treatment/Interventions ADLs/Self Care Home Management;Cryotherapy;Electrical Stimulation;Iontophoresis 107m/ml Dexamethasone;Moist Heat;Ultrasound;Therapeutic exercise;Therapeutic activities;Functional mobility training;Stair training;Gait training;Balance training;Neuromuscular re-education;Patient/family education;Orthotic Fit/Training;Manual techniques;Passive range of motion;Dry needling;Energy conservation;Vasopneumatic Device;Joint Manipulations;Spinal Manipulations    Consulted and Agree with Plan of Care Patient           Patient will benefit from skilled therapeutic intervention in order to improve the following deficits and impairments:  Abnormal gait,Decreased mobility,Decreased activity tolerance,Decreased endurance,Decreased strength,Hypomobility,Impaired flexibility,Difficulty walking,Decreased safety awareness,Decreased balance  Visit Diagnosis: Other abnormalities of gait and mobility  Muscle weakness (generalized)     Problem List Patient Active Problem List   Diagnosis Date Noted  . [place] ([place] 05/03/2020  . Aortic atherosclerosis ([place] 05/21/2019  . Fall at home, initial encounter 05/15/2019  . Right rib fracture 05/15/2019  . Hyperglycemia 04/28/2019  . Insect bite of left ankle 04/30/2017  . Gout 03/14/2017  . Vitamin D deficiency 03/14/2017  . BPH associated with nocturia 03/14/2017  . Hyperlipidemia 03/14/2017  . GERD (gastroesophageal reflux disease) 03/14/2017  . Peripheral polyneuropathy 02/16/2016  . Diplopia 11/19/2015    LLyndee Hensen PT, DPT 2:00 PM  01/15/21    [address] [place] [address]Phone: [phone]  Fax:  [phone] Name: Allen NickersonMRN: [id number] of  Birth: [birth year]   PHYSICAL THERAPY DISCHARGE SUMMARY Visits:  14   Plan: Patient agrees to discharge.  Patient goals were not met. Patient is being discharged due to meeting the stated rehab goals.  ?????     LLyndee Hensen PT, DPT 2:00 PM  01/15/21

## 2021-01-24 LAB — PSA: PSA: 0.919

## 2021-01-24 LAB — LIPID PANEL
Cholesterol: 143 (ref 0–200)
HDL: 43 (ref 35–70)
LDL Cholesterol: 79
Triglycerides: 107 (ref 40–160)

## 2021-01-24 LAB — HEPATIC FUNCTION PANEL
ALT: 28 (ref 10–40)
AST: 23 (ref 14–40)
Alkaline Phosphatase: 92 (ref 25–125)
Bilirubin, Direct: 0.2 (ref 0.01–0.4)
Bilirubin, Total: 0.7

## 2021-01-24 LAB — CBC: RBC: 4.3 (ref 3.87–5.11)

## 2021-01-24 LAB — HEMOGLOBIN A1C: Hemoglobin A1C: 6.2

## 2021-01-24 LAB — BASIC METABOLIC PANEL
BUN: 29 — AB (ref 4–21)
CO2: 27 — AB (ref 13–22)
Chloride: 108 (ref 99–108)
Creatinine: 1.1 (ref 0.6–1.3)
Glucose: 96
Potassium: 4.1 (ref 3.4–5.3)
Sodium: 140 (ref 137–147)

## 2021-01-24 LAB — VITAMIN B12: Vitamin B-12: 578

## 2021-01-24 LAB — CBC AND DIFFERENTIAL
HCT: 40 — AB (ref 41–53)
Hemoglobin: 13.2 — AB (ref 13.5–17.5)
Platelets: 158 (ref 150–399)
WBC: 7.4

## 2021-01-24 LAB — TSH: TSH: 3.78 (ref 0.41–5.90)

## 2021-01-24 LAB — COMPREHENSIVE METABOLIC PANEL
Albumin: 3.6 (ref 3.5–5.0)
Calcium: 8.7 (ref 8.7–10.7)

## 2021-01-24 LAB — VITAMIN D 25 HYDROXY (VIT D DEFICIENCY, FRACTURES): Vit D, 25-Hydroxy: 27.55

## 2021-03-07 ENCOUNTER — Telehealth: Payer: Self-pay | Admitting: Family Medicine

## 2021-03-07 NOTE — Progress Notes (Signed)
  Chronic Care Management   Note  03/07/2021 Name: Ein Rijo MRN: [id number] DOB: [birth year]  Akif Weldy is a 87 y.o. year old male who is a primary care patient of Marin Olp, MD. I reached out to [employer] by phone today in response to a referral sent by Mr. Herbert Seta Gilman's PCP, Yong Channel Brayton Mars, MD.   Mr. Peplinski was given information about Chronic Care Management services today including:  1. CCM service includes personalized support from designated clinical staff supervised by his physician, including individualized plan of care and coordination with other care providers 2. 24/7 contact phone numbers for assistance for urgent and routine care needs. 3. Service will only be billed when office clinical staff spend 20 minutes or more in a month to coordinate care. 4. Only one practitioner may furnish and bill the service in a calendar month. 5. The patient may stop CCM services at any time (effective at the end of the month) by phone call to the office staff.   Patient agreed to services and verbal consent obtained.   Follow up plan:   Lauretta Grill Upstream Scheduler

## 2021-03-07 NOTE — Chronic Care Management (AMB) (Signed)
  Chronic Care Management   Note  03/07/2021 Name: Jas Betten MRN: [id number] DOB: Jun 10, 1926  Yuriel Lopezmartinez is a 87 y.o. year old male who is a primary care patient of Marin Olp, MD. I reached out to [employer] by phone today in response to a referral sent by Mr. Herbert Seta Reetz's PCP, Yong Channel Brayton Mars, MD.   Mr. Canavan was given information about Chronic Care Management services today including:  1. CCM service includes personalized support from designated clinical staff supervised by his physician, including individualized plan of care and coordination with other care providers 2. 24/7 contact phone numbers for assistance for urgent and routine care needs. 3. Service will only be billed when office clinical staff spend 20 minutes or more in a month to coordinate care. 4. Only one practitioner may furnish and bill the service in a calendar month. 5. The patient may stop CCM services at any time (effective at the end of the month) by phone call to the office staff.   Patient agreed to services and verbal consent obtained.   Follow up plan:   Lauretta Grill Upstream Scheduler

## 2021-03-25 ENCOUNTER — Telehealth: Payer: Self-pay

## 2021-03-25 NOTE — Chronic Care Management (AMB) (Signed)
Chronic Care Management Pharmacy Assistant   Name: Allen Robinson  MRN: [id number] DOB: [birth year]  Allen Robinson is an 87 y.o. year old male who presents for his initial CCM visit with the clinical pharmacist.  Reason for Encounter: Chart Prep/ IQ  Recent office visits:  11/02/20- Allen Reddish, MD- seen for balance disorder, referral to PT, follow up 6 months  Recent consult visits:  No visits noted  Hospital visits:  None in previous 6 months  Medications: Outpatient Encounter Medications as of 03/25/2021  Medication Sig Note  . acetaminophen (TYLENOL) 500 MG tablet Take 1 tablet (500 mg total) by mouth every 6 (six) hours as needed for mild pain or moderate pain (or Fever >/= 101).   [name] allopurinol (ZYLOPRIM) 100 MG tablet Take 100 mg by mouth 2 (two) times daily.    . colchicine 0.6 MG tablet Take 0.6 mg by mouth as needed. (Patient not taking: Reported on 11/05/2020) 11/05/2020: As needed   . finasteride (PROSCAR) 5 MG tablet Take 5 mg by mouth daily.   [name] omeprazole (PRILOSEC) 20 MG capsule Take 20 mg by mouth daily as needed.    . simvastatin (ZOCOR) 40 MG tablet Take 20 mg by mouth at bedtime.     No facility-administered encounter medications on file as of 03/25/2021.   Current Documented Medications: Patient receives from [place]  Acetaminophen 500 mg- Patient reported not taking  Allopurinol 100 mg-PRN- Patient reported not taking  Colchicine 0.6 mg-PRN- Patient reported not taking  Finasteride 5 mg Omeprazole 20 mg-PRN  Simvastatin 20 mg Vesicare 5 mg  Vitamin Multivitamin Centrum Silver Men 50+  Have you seen any other providers since your last visit? Patient stated he saw urology  Dr Burnis Medin at  [facility] 05/05  Any changes in your medications or health?  Patient stated he was prescribed Vesicare 5 mg daily   Any side effects from any medications?  Patient stated no current side effects  Do you have an symptoms or problems not managed  by your medications? Patient stated there are no unmanaged symptoms  Any concerns about your health right now?  Allen Robinson stated he is only concerned about his balance problem. He was going to PT twice a week, but says the instructor was unable to help him so PT was discontinued. He is afraid that he will eventually fall and seriously injure himself   Has your provider asked that you check blood pressure, blood sugar, or follow special diet at home?  Patient has not been asked to adhere to special non drug regimens  Do you get any type of exercise on a regular basis?  Patient state he is a very active person. His balance leads to falls which hinders his activities. Nonetheless, he enjoys being outdoors, doing yard work, and running errands  Can you think of a goal you would like to reach for your health? Patient only had a goal of being able to lessen his fall risk   Do you have any problems getting your medications?  Patient stated there is a two week delay between the time he is prescribed a medication to the time he actually begins taking them. He uses [facility] for emergency fills when he submits the prescription to the [place]. He does not have an emergency fill for Vesicare and stated he would like help getting one because he anticipates the 2 week wait for his medications.  Is there anything that you would like to  discuss during the appointment?  Patient would like to discuss his balance skills, a fill for Vesicare, and billing information  Please bring medications and supplements to appointment Reminded patient of initial office visit with CPP on 05/09 at 2 pm   [place]

## 2021-03-28 ENCOUNTER — Ambulatory Visit (INDEPENDENT_AMBULATORY_CARE_PROVIDER_SITE_OTHER): Payer: Medicare Other

## 2021-03-28 DIAGNOSIS — R351 Nocturia: Secondary | ICD-10-CM

## 2021-03-28 DIAGNOSIS — N401 Enlarged prostate with lower urinary tract symptoms: Secondary | ICD-10-CM | POA: Diagnosis not present

## 2021-03-28 DIAGNOSIS — E785 Hyperlipidemia, unspecified: Secondary | ICD-10-CM

## 2021-03-28 DIAGNOSIS — K219 Gastro-esophageal reflux disease without esophagitis: Secondary | ICD-10-CM

## 2021-03-28 NOTE — Progress Notes (Signed)
Chronic Care Management Pharmacy Note  03/28/2021 Name:  Allen Robinson MRN:  [id number] DOB:  Dec 05, 1925  Subjective: Allen Robinson is an 87 y.o. year old male who is a primary patient of Hunter, Brayton Mars, MD.  The CCM team was consulted for assistance with disease management and care coordination needs.    Engaged with patient by telephone for initial visit in response to provider referral for pharmacy case management and/or care coordination services.   Consent to Services:  The patient was given the following information about Chronic Care Management services today, agreed to services, and gave verbal consent: 1. CCM service includes personalized support from designated clinical staff supervised by the primary care provider, including individualized plan of care and coordination with other care providers 2. 24/7 contact phone numbers for assistance for urgent and routine care needs. 3. Service will only be billed when office clinical staff spend 20 minutes or more in a month to coordinate care. 4. Only one practitioner may furnish and bill the service in a calendar month. 5.The patient may stop CCM services at any time (effective at the end of the month) by phone call to the office staff. 6. The patient will be responsible for cost sharing (co-pay) of up to 20% of the service fee (after annual deductible is met). Patient agreed to services and consent obtained.  Patient Care Team: Marin Olp, MD as PCP - General (Family Medicine) Clinic, Thayer Dallas as Consulting Physician Jarome Matin, MD as Consulting Physician (Dermatology) Madelin Rear, [facility] as Pharmacist (Pharmacist)  Recent office visits:  11/02/20- Garret Reddish, MD- seen for balance disorder, referral to PT, follow up 6 months  Recent consult visits:  No visits noted  Hospital visits:  None in previous 6 months Objective:  Lab Results  Component Value Date   CREATININE 1.0 12/11/2019    CREATININE 0.93 05/21/2019   CREATININE 1.02 05/16/2019    Lab Results  Component Value Date   HGBA1C 6.1 12/11/2019   Last diabetic Eye exam: No results found for: HMDIABEYEEXA  Last diabetic Foot exam: No results found for: HMDIABFOOTEX      Component Value Date/Time   CHOL 151 12/11/2019 0000   TRIG 84 12/11/2019 0000   HDL 46 12/11/2019 0000   LDLCALC 88 12/11/2019 0000    Hepatic Function Latest Ref Rng & Units 12/11/2019 05/21/2019 01/27/2019  Total Protein 6.0 - 8.3 g/dL - 6.2 -  Albumin 3.5 - 5.0 3.7 4.0 -  AST 14 - 40 '18 19 26  ' ALT 10 - 40 23 19 59(A)  Alk Phosphatase 25 - 125 101 98 134(A)  Total Bilirubin 0.2 - 1.2 mg/dL - 0.6 -  Bilirubin, Direct 0.01 - 0.4 0.1 - -    Lab Results  Component Value Date/Time   TSH 4.110 02/16/2016 10:24 AM    CBC Latest Ref Rng & Units 12/11/2019 05/21/2019 05/16/2019  WBC - 6.6 7.4 7.0  Hemoglobin 13.5 - 17.5 14.0 12.9(L) 12.4(L)  Hematocrit 41 - 53 43 38.3(L) 37.5(L)  Platelets 150 - 399 149(A) 167.0 135(L)    Lab Results  Component Value Date/Time   VD25OH 36.67 05/21/2019 11:23 AM   VD25OH 31.73 01/27/2019 12:00 AM   VD25OH 34.89 01/18/2017 12:00 AM   Clinical ASCVD: No  The ASCVD Risk score Mikey Bussing DC Jr., et al., 2013) failed to calculate for the following reasons:   The 2013 ASCVD risk score is only valid for ages 3 to 62  Social History   Tobacco Use  Smoking Status Never Smoker  Smokeless Tobacco Never Used   BP Readings from Last 3 Encounters:  11/02/20 119/65  05/03/20 118/60  10/28/19 124/62   Pulse Readings from Last 3 Encounters:  11/02/20 (!) 57  05/03/20 61  10/28/19 64   Wt Readings from Last 3 Encounters:  11/02/20 169 lb (76.7 kg)  05/03/20 168 lb 6.4 oz (76.4 kg)  10/28/19 167 lb (75.8 kg)    Assessment: Review of patient past medical history, allergies, medications, health status, including review of consultants reports, laboratory and other test data, was performed as part of  comprehensive evaluation and provision of chronic care management services.   SDOH:  (Social Determinants of Health) assessments and interventions performed: Yes.  CCM Care Plan  No Known Allergies  Medications Reviewed Today    Reviewed by Madelin Rear, [facility] (Pharmacist) on 03/28/21 at 1510  Med List Status: <None>  Medication Order Taking? Sig Documenting Provider Last Dose Status Informant  acetaminophen (TYLENOL) 500 MG tablet [id number]  Take 1 tablet (500 mg total) by mouth every 6 (six) hours as needed for mild pain or moderate pain (or Fever >/= 101). Domenic Polite, MD  Active   allopurinol (ZYLOPRIM) 100 MG tablet [id number]  Take 100 mg by mouth 2 (two) times daily.  [provider]  Active Self  colchicine 0.6 MG tablet [id number]  Take 0.6 mg by mouth as needed.  Patient not taking: Reported on 11/05/2020   [provider]  Active Self           Med Note Willette Brace   Fri Nov 05, 2020  9:37 AM) As needed   finasteride (PROSCAR) 5 MG tablet [id number] Yes Take 5 mg by mouth daily. [provider] Taking Active Self  omeprazole (PRILOSEC) 20 MG capsule [id number]  Take 20 mg by mouth daily as needed.  [provider]  Active Self  simvastatin (ZOCOR) 40 MG tablet [id number] Yes Take 20 mg by mouth at bedtime.  [provider] Taking Active Self  solifenacin (VESICARE) 10 MG tablet [id number] Yes Take 5 mg by mouth daily. [provider]  Active Self           Med Note Ermalinda Memos Mar 28, 2021  3:09 PM) Has not started - will start once rec'd by [facility] mail order.           Patient Active Problem List   Diagnosis Date Noted  . Senile purpura ([employer]) 05/03/2020  . Aortic atherosclerosis ([place]) 05/21/2019  . Fall at home, initial encounter 05/15/2019  . Right rib fracture 05/15/2019  . Hyperglycemia 04/28/2019  . Insect bite of left ankle 04/30/2017  . Gout 03/14/2017  . Vitamin D deficiency 03/14/2017  . BPH  associated with nocturia 03/14/2017  . Hyperlipidemia 03/14/2017  . GERD (gastroesophageal reflux disease) 03/14/2017  . Peripheral polyneuropathy 02/16/2016  . Diplopia 11/19/2015    Immunization History  Administered Date(s) Administered  . Fluad Quad(high Dose 65+) 09/20/2020  . Influenza, High Dose Seasonal PF 09/04/2017, 09/12/2018  . Influenza,inj,Quad PF,6+ Mos 09/08/2016  . Moderna Sars-Covid-2 Vaccination 12/11/2019, 01/15/2020, 09/20/2020  . Pneumococcal Conjugate-13 01/23/2014  . Pneumococcal-Unspecified 08/20/2000  . Td 04/30/2017  . Tdap 11/21/2010    Conditions to be addressed/monitored:  HLD, Aortic Atherosclerosis, Gout, BPH, Hyperglycemia, Vitamin D deficiency   Care Plan : [place]  Updates made by Madelin Rear, [facility] since 03/28/2021 12:00 AM  Problem: Aortic Atherosclerosis, Gout, BPH, Hyperglycemia, Vitamin D deficiency   Priority: High    Long-Range Goal: Disease Management   Start Date: 03/28/2021  Expected End Date: 03/28/2022  This Visit's Progress: On track  Priority: High  Note:   Current Barriers:  [name] Medication related questions - BPH HLD  Pharmacist Clinical Goal(s):  [name] Patient will contact provider office for questions/concerns as evidenced notation of same in electronic health record through collaboration with PharmD and provider.   Interventions: . 1:1 collaboration with Marin Olp, MD regarding development and update of comprehensive plan of care as evidenced by provider attestation and co-signature . Inter-disciplinary care team collaboration (see longitudinal plan of care) . Comprehensive medication review performed; medication list updated in electronic medical record . No medication changes this visit  Hyperlipidemia: (LDL goal < 70) -Reasonably controlled due to age and LDL <100  -04/2019 CT - abdominal aortic atherosclerosis without aneurysm  -Current treatment: . Simvastatin 40 mg once daily -Medications  previously tried: simvastatin 20 mg once daily   -Current dietary patterns: enjoys eating - meat and potatoes. Portion control -Current exercise habits: PT exercises, stationary bike, yard work.  -Educated on Cholesterol goals; answered questions related to simvastatin and aspirin  -Recommended to continue current medication  GERD (Goal: minimize symptoms) -Controlled -Triggers might be tomato sauce or spicy foods, omeprazole works when taken proactively on as needed basis. Is happy with overall acid reflux control  -Current treatment  . Omeprazole 20 mg once daily - takes as needed -Reviewed triggers  -Recommended to continue current medication due to infrequent use  BPH (Goal: minimize symptoms, optimize medication safety) -Controlled -Hx of falling at at home  -Current treatment  . Finasteride 5 mg once daily . Vesicare 5 mg once daily (plans to start somepoint 03/2021 - coordinated care through [place] - Dr Gaynelle Arabian [facility]) -Clarified questions related to [facility], which he will be getting through the [place], is ok with getting through mail and understands this does not necessarily need to be started right away. Counseled on potential side effects - expresses understanding of anticholingeric related side effects  Gout (Goal: minimize symptoms) -Controlled -Gout has rarely been an issue for patient  -Current treatment  . Allopurinol 100 mg - two tabs daily  . Colchicine 0.6 mg - cuts in half (0.44m) as needed -Reviewed diet - no specific activity to limit purine rich foods -Recommended to continue current medication  Patient Goals/Self-Care Activities . Patient will:  - take medications as prescribed  Medication Assistance: None required.  Patient affirms current coverage meets needs. [facility] [place] [address] [place]: [phone]: [phone] Follow Up:   Patient agrees to Care Plan and Follow-up. [facility]    Future Appointments  Date Time Provider DHollister 04/27/2021  9:40 AM HMarin Olp MD [facility]  10/31/2021  3:30 PM [facility] CCM PHARMACIST [facility]  11/11/2021  9:30 AM [facility] HEALTH COACH [facility]   JMadelin Rear PharmD, CPP Clinical Pharmacist Practitioner  [facility]  [phone]

## 2021-03-28 NOTE — Patient Instructions (Addendum)
Allen Robinson,  Thank you for talking with me today. I have included our care plan/goals in the following pages.   Please review and call me at [phone] with any questions.  Thanks! Ellin Mayhew, Pharm.D., BCGP Clinical Pharmacist [facility] [phone] Patient Care Plan: Cash Plan    Problem Identified: Aortic Atherosclerosis, Gout, BPH, Hyperglycemia, Vitamin D deficiency   Priority: High    Long-Range Goal: Disease Management   Start Date: 03/28/2021  Expected End Date: 03/28/2022  This Visit's Progress: On track  Priority: High  Note:   Current Barriers:  [name] Medication related questions - BPH HLD  Pharmacist Clinical Goal(s):  [name] Patient will contact provider office for questions/concerns as evidenced notation of same in electronic health record through collaboration with PharmD and provider.   Interventions: . 1:1 collaboration with Marin Olp, MD regarding development and update of comprehensive plan of care as evidenced by provider attestation and co-signature . Inter-disciplinary care team collaboration (see longitudinal plan of care) . Comprehensive medication review performed; medication list updated in electronic medical record . No medication changes this visit  Hyperlipidemia: (LDL goal < 70) -Reasonably controlled due to age and LDL <100  -04/2019 CT - abdominal aortic atherosclerosis without aneurysm  -Current treatment: . Simvastatin 40 mg once daily -Medications previously tried: simvastatin 20 mg once daily   -Current dietary patterns: enjoys eating - meat and potatoes. Portion control -Current exercise habits: PT exercises, stationary bike, yard work.  -Educated on Cholesterol goals; answered questions related to simvastatin and aspirin  -Recommended to continue current medication  GERD (Goal: minimize symptoms) -Controlled -Triggers might be tomato sauce or spicy foods,  omeprazole works when taken proactively on as needed basis. Is happy with overall acid reflux control  -Current treatment  . Omeprazole 20 mg once daily - takes as needed -Reviewed triggers  -Recommended to continue current medication due to infrequent use  BPH (Goal: minimize symptoms, optimize medication safety) -Controlled -Hx of falling at at home  -Current treatment  . Finasteride 5 mg once daily . Vesicare 5 mg once daily (plans to start somepoint 03/2021 - coordinated care through [place] - Dr Gaynelle Arabian at [facility]) -Clarified questions related to [facility], which he will be getting through the [place], is ok with getting through mail and understands this does not necessarily need to be started right away. Counseled on potential side effects - expresses understanding of anticholingeric related side effects  Gout (Goal: minimize symptoms) -Controlled -Gout has rarely been an issue for patient  -Current treatment  . Allopurinol 100 mg - two tabs daily  . Colchicine 0.6 mg - cuts in half (0.61m) as needed -Reviewed diet - no specific activity to limit purine rich foods -Recommended to continue current medication  Patient Goals/Self-Care Activities . Patient will:  - take medications as prescribed  Medication Assistance: None required.  Patient affirms current coverage meets needs. [facility] [place] [address] [id number]: [phone]: [phone] Follow Up:  Patient agrees to Care Plan and Follow-up. Plan: [facility] f/u 6-8 months      The patient was given the following information about Chronic Care Management services today, agreed to services, and gave verbal consent: 1. CCM service includes personalized support from designated clinical staff supervised by the primary care provider, including individualized plan of care and coordination with other care providers 2.  24/7  contact phone numbers for assistance for urgent and routine care needs. 3. Service will only be billed when office clinical staff spend 20 minutes or more in a month to coordinate care. 4. Only one practitioner may furnish and bill the service in a calendar month. 5.The patient may stop CCM services at any time (effective at the end of the month) by phone call to the office staff. 6. The patient will be responsible for cost sharing (co-pay) of up to 20% of the service fee (after annual deductible is met). Patient agreed to services and consent obtained.  The patient verbalized understanding of instructions provided today and agreed to receive a MyChart copy of patient instruction and/or educational materials. Telephone follow up appointment with pharmacy team member scheduled for: See next appointment with "Care Management Staff" under "What's Next" below.  High Cholesterol  High cholesterol is a condition in which the blood has high levels of a white, waxy substance similar to fat (cholesterol). The liver makes all the cholesterol that the body needs. The human body needs small amounts of cholesterol to help build cells. A person gets extra or excess cholesterol from the food that he or she eats. The blood carries cholesterol from the liver to the rest of the body. If you have high cholesterol, deposits (plaques) may build up on the walls of your arteries. Arteries are the blood vessels that carry blood away from your heart. These plaques make the arteries narrow and stiff. Cholesterol plaques increase your risk for heart attack and stroke. Work with your health care provider to keep your cholesterol levels in a healthy range. What increases the risk? The following factors may make you more likely to develop this condition:  Eating foods that are high in animal fat (saturated fat) or cholesterol.  Being overweight.  Not getting enough exercise.  A family history of high cholesterol (familial  hypercholesterolemia).  Use of tobacco products.  Having diabetes. What are the signs or symptoms? There are no symptoms of this condition. How is this diagnosed? This condition may be diagnosed based on the results of a blood test.  If you are older than 85 years of age, your health care provider may check your cholesterol levels every 4-6 years.  You may be checked more often if you have high cholesterol or other risk factors for heart disease. The blood test for cholesterol measures:  "Bad" cholesterol, or LDL cholesterol. This is the main type of cholesterol that causes heart disease. The desired level is less than 100 mg/dL.  "Good" cholesterol, or HDL cholesterol. HDL helps protect against heart disease by cleaning the arteries and carrying the LDL to the liver for processing. The desired level for HDL is 60 mg/dL or higher.  Triglycerides. These are fats that your body can store or burn for energy. The desired level is less than 150 mg/dL.  Total cholesterol. This measures the total amount of cholesterol in your blood and includes LDL, HDL, and triglycerides. The desired level is less than 200 mg/dL. How is this treated? This condition may be treated with:  Diet changes. You may be asked to eat foods that have more fiber and less saturated fats or added sugar.  Lifestyle changes. These may include regular exercise, maintaining a healthy weight, and quitting use of tobacco products.  Medicines. These are given when diet and lifestyle changes have not worked. You may be prescribed a statin medicine to help lower your cholesterol levels. Follow these instructions at  home: Eating and drinking  Eat a healthy, balanced diet. This diet includes: ? Daily servings of a variety of fresh, frozen, or canned fruits and vegetables. ? Daily servings of whole grain foods that are rich in fiber. ? Foods that are low in saturated fats and trans fats. These include poultry and fish without  skin, lean cuts of meat, and low-fat dairy products. ? A variety of fish, especially oily fish that contain omega-3 fatty acids. Aim to eat fish at least 2 times a week.  Avoid foods and drinks that have added sugar.  Use healthy cooking methods, such as roasting, grilling, broiling, baking, poaching, steaming, and stir-frying. Do not fry your food except for stir-frying.   Lifestyle  Get regular exercise. Aim to exercise for a total of 150 minutes a week. Increase your activity level by doing activities such as gardening, walking, and taking the stairs.  Do not use any products that contain nicotine or tobacco, such as cigarettes, e-cigarettes, and chewing tobacco. If you need help quitting, ask your health care provider.   General instructions  Take over-the-counter and prescription medicines only as told by your health care provider.  Keep all follow-up visits as told by your health care provider. This is important. Where to find more information  American Heart Association: www.heart.org  National Heart, Lung, and Blood Institute: https://wilson-eaton.com/ Contact a health care provider if:  You have trouble achieving or maintaining a healthy diet or weight.  You are starting an exercise program.  You are unable to stop smoking. Get help right away if:  You have chest pain.  You have trouble breathing.  You have any symptoms of a stroke. "BE FAST" is an easy way to remember the main warning signs of a stroke: ? B - Balance. Signs are dizziness, sudden trouble walking, or loss of balance. ? E - Eyes. Signs are trouble seeing or a sudden change in vision. ? F - Face. Signs are sudden weakness or numbness of the face, or the face or eyelid drooping on one side. ? A - Arms. Signs are weakness or numbness in an arm. This happens suddenly and usually on one side of the body. ? S - Speech. Signs are sudden trouble speaking, slurred speech, or trouble understanding what people say. ? T -  Time. Time to call emergency services. Write down what time symptoms started.  You have other signs of a stroke, such as: ? A sudden, severe headache with no known cause. ? Nausea or vomiting. ? Seizure. These symptoms may represent a serious problem that is an emergency. Do not wait to see if the symptoms will go away. Get medical help right away. Call your local emergency services (911 in the [place].S.). Do not drive yourself to the hospital. Summary  Cholesterol plaques increase your risk for heart attack and stroke. Work with your health care provider to keep your cholesterol levels in a healthy range.  Eat a healthy, balanced diet, get regular exercise, and maintain a healthy weight.  Do not use any products that contain nicotine or tobacco, such as cigarettes, e-cigarettes, and chewing tobacco.  Get help right away if you have any symptoms of a stroke. This information is not intended to replace advice given to you by your health care provider. Make sure you discuss any questions you have with your health care provider. Document Revised: 10/06/2019 Document Reviewed: 10/06/2019 Elsevier Patient Education  2021 [employer].

## 2021-04-11 ENCOUNTER — Other Ambulatory Visit (HOSPITAL_BASED_OUTPATIENT_CLINIC_OR_DEPARTMENT_OTHER): Payer: Self-pay

## 2021-04-11 ENCOUNTER — Ambulatory Visit: Payer: Medicare Other | Attending: Internal Medicine

## 2021-04-11 ENCOUNTER — Other Ambulatory Visit: Payer: Self-pay

## 2021-04-11 DIAGNOSIS — Z23 Encounter for immunization: Secondary | ICD-10-CM

## 2021-04-11 MED ORDER — COVID-19 MRNA VACC (MODERNA) 100 MCG/0.5ML IM SUSP
INTRAMUSCULAR | 0 refills | Status: DC
  Filled 2021-04-11: qty 0.25, 1d supply, fill #0

## 2021-04-11 NOTE — Progress Notes (Signed)
   Covid-19 Vaccination Clinic  Name:  Linda Biehn    MRN: [id number] DOB: December 01, 1925  04/11/2021  Mr. Arata was observed post Covid-19 immunization for 15 minutes without incident. He was provided with Vaccine Information Sheet and instruction to access the V-Safe system.   Mr. Chirico was instructed to call 911 with any severe reactions post vaccine: [name] Difficulty breathing  . Swelling of face and throat  . A fast heartbeat  . A bad rash all over body  . Dizziness and weakness   Immunizations Administered    Name Date Dose VIS Date Route   PFIZER Comrnaty(Gray TOP) Covid-19 Vaccine 04/11/2021  3:08 PM 0.3 mL 10/28/2020 Intramuscular   Manufacturer: [place]   Lot: [id number]   NDC: [phone]

## 2021-04-26 NOTE — Patient Instructions (Addendum)
Health Maintenance Due  Topic Date Due  . Zoster Vaccines- Shingrix (1 of 2) consider at the [place]- likely cheapest there since you have told me in the past do not have part D insurance Never done   [place] to see you today! Please use your cane at all times! We desperately want to avoid falls. If you have falls with cane we need to advance to a walker  Recommended follow up: 35-month follow-up or sooner if needed

## 2021-04-27 ENCOUNTER — Other Ambulatory Visit: Payer: Self-pay

## 2021-04-27 ENCOUNTER — Encounter: Payer: Self-pay | Admitting: Family Medicine

## 2021-04-27 ENCOUNTER — Ambulatory Visit (INDEPENDENT_AMBULATORY_CARE_PROVIDER_SITE_OTHER): Payer: Medicare Other | Admitting: Family Medicine

## 2021-04-27 VITALS — BP 126/74 | HR 70 | Temp 97.7°F | Ht 70.0 in | Wt 169.0 lb

## 2021-04-27 DIAGNOSIS — M1A0790 Idiopathic chronic gout, unspecified ankle and foot, without tophus (tophi): Secondary | ICD-10-CM | POA: Diagnosis not present

## 2021-04-27 DIAGNOSIS — R351 Nocturia: Secondary | ICD-10-CM

## 2021-04-27 DIAGNOSIS — E785 Hyperlipidemia, unspecified: Secondary | ICD-10-CM | POA: Diagnosis not present

## 2021-04-27 DIAGNOSIS — D692 Other nonthrombocytopenic purpura: Secondary | ICD-10-CM | POA: Diagnosis not present

## 2021-04-27 DIAGNOSIS — K219 Gastro-esophageal reflux disease without esophagitis: Secondary | ICD-10-CM | POA: Diagnosis not present

## 2021-04-27 DIAGNOSIS — N401 Enlarged prostate with lower urinary tract symptoms: Secondary | ICD-10-CM | POA: Diagnosis not present

## 2021-04-27 DIAGNOSIS — I700 Atherosclerosis of aorta: Secondary | ICD-10-CM

## 2021-04-27 MED ORDER — VITAMIN D (ERGOCALCIFEROL) 1.25 MG (50000 UNIT) PO CAPS: 50000.0000 [IU] | ORAL_CAPSULE | ORAL | 0 refills | Status: DC

## 2021-04-27 NOTE — Progress Notes (Signed)
Phone [phone] In person visit   Subjective:   Allen Robinson is a 87 y.o. year old very pleasant male patient who presents for/with See problem oriented charting Chief Complaint  Patient presents with  . Hyperlipidemia  . Balance Disorder  . Medicaions    Patient wants to discuss a new script that he has received from the [place]    This visit occurred during the SARS-CoV-2 public health emergency.  Safety protocols were in place, including screening questions prior to the visit, additional usage of staff PPE, and extensive cleaning of exam room while observing appropriate contact time as indicated for disinfecting solutions.   Past Medical History-  Patient Active Problem List   Diagnosis Date Noted  . Aortic atherosclerosis ([place]) 05/21/2019    Priority: Medium  . Gout 03/14/2017    Priority: Medium  . BPH associated with nocturia 03/14/2017    Priority: Medium  . Hyperlipidemia 03/14/2017    Priority: Medium  . GERD (gastroesophageal reflux disease) 03/14/2017    Priority: Medium  . Senile purpura ([place]) 05/03/2020    Priority: Low  . Right rib fracture 05/15/2019    Priority: Low  . Hyperglycemia 04/28/2019    Priority: Low  . Vitamin D deficiency 03/14/2017    Priority: Low  . Peripheral polyneuropathy 02/16/2016    Priority: Low  . Diplopia 11/19/2015    Priority: Low    Medications- reviewed and updated Current Outpatient Medications  Medication Sig Dispense Refill  . allopurinol (ZYLOPRIM) 100 MG tablet Take 100 mg by mouth 2 (two) times daily.     . colchicine 0.6 MG tablet Take 0.6 mg by mouth as needed.    . finasteride (PROSCAR) 5 MG tablet Take 5 mg by mouth daily.    . Multiple Vitamin (MULTIVITAMIN ADULT PO) Take by mouth.    [name] omeprazole (PRILOSEC) 20 MG capsule Take 20 mg by mouth daily as needed.     . simvastatin (ZOCOR) 40 MG tablet Take 20 mg by mouth at bedtime.     . Vitamin D, Ergocalciferol, (DRISDOL) 1.25 MG (50000 UNIT) CAPS capsule  Take 1 capsule (50,000 Units total) by mouth every 7 (seven) days. 12 capsule 0  . acetaminophen (TYLENOL) 500 MG tablet Take 1 tablet (500 mg total) by mouth every 6 (six) hours as needed for mild pain or moderate pain (or Fever >/= 101). (Patient not taking: Reported on 04/27/2021)    . solifenacin (VESICARE) 10 MG tablet Take 5 mg by mouth daily.     No current facility-administered medications for this visit.     Objective:  BP 126/74   Pulse 70   Temp 97.7 F (36.5 C) (Temporal)   Ht 5\' 10"  (1.778 m)   Wt 169 lb (76.7 kg)   SpO2 97%   BMI 24.25 kg/m  Gen: NAD, resting comfortably CV: RRR no murmurs rubs or gallops HEENT: TMs and canals normal Lungs: CTAB no crackles, wheeze, rhonchi Abdomen: soft/nontender/nondistended/normal bowel sounds. No rebound or guarding.  Ext: trace edema Skin: multiple bruises on bilateral forearms, healed abrasion on the right elbow approximately 2 x 1/2 cm FOOT: Fungus noted in right great toenail, intact with monofilament bilaterally, intact distal pulses bilaterally- decreased proprioception perception with movement of toes     Assessment and Plan   #[facility] will be abstracted by team  # Balance Issues  S:4-5 falls since his last visit.  All of these occur when he is not using his  walker-we reinforced the importance of always using his cane.  He has 2 walkers available and is not using.  He has an unsteady gait and question what cane be done to control his balance. Physical therapy has not helped either [facility] or at the [facility]-denies ever being dizziness instead simply an imbalance. Going up the stairs and pivoting left to right or right to left is difficult for him.  Seems to do the best with straightaway's.  He has stopped mowing his grass since last visit-did okay with mowing but was having difficulty with bagging. -[facility] to [facility] for Parkinson's- sounds like DAT from June 21, 2018 A/P: Patient with continued balance issues- poor proprioception of toes on exam-likely from neuropathy- we discussed possible referral to neurology but he declines for now- I was honest I was not sure that much could be done for this especially since PT has not been helpful.  The key thing is being consistent with pain use  # Hyperlipidemia/aortic atherosclerosis - incidental finding on prior imaging with ideal LDL goal under 70 S:Medication - Simvastatin 40 mg LDL was mildly elevated above 70 on March 7 th 2022  A/P: Mild poor control but would not want to increase prediabetes risk with statin   #Gout-uric acid has been below 6 S: No flares on Allopurinol 100 mg tablet.  Has colchicine available if needed A/P: Stable. Continue current medications.  Do not have recent uric acid level but since he has not had flares I think this is less necessary  # GERD S:Medication - Omeprazole 20 mg-tolerating well with no recurrent flare ups unless he eats certain foods that are triggers-occasional breakthrough A/P: With occasional breakthrough I do not think we can reduce medication-continue omeprazole-B12 level was noted and [facility]  #BPH S: Medication:- Finasteride 5 mg from the [place], -Nocturia - gets up 3x a night to urinate causing poor sleep. A/P: Patient was prescribed Vesicare but read the directions about potential confusion and increased fall risk- due to his concern about falls opted not to take this- I think that is reasonable  # Hyperglycemia/insulin resistance/prediabetes-peak recent A1c 6.3 in 2020 S: Exercise and diet - Able to walk 1.25 miles daily and reasonably healthy diet.  Lab Results  Component Value Date   HGBA1C 6.1 12/11/2019   HGBA1C 6.1 05/21/2019   HGBA1C 6.3 01/27/2019  A/P: Most recent A1c was 6. 2-team l will abstract this.  Continue without medications-continue efforts for exercise and healthy eating  #Vitamin D deficiency S: Medication: None  currently.  On recent labs was below 28 [facility] A/P: Vitamin D poorly controlled start 50000 units per week than convert to 1000 units per day   # Constipation-sparing MiraLAX if needed.  Colace if needed.   Recommended follow up: 32-month follow-up or sooner if needed Future Appointments  Date Time Provider [place]  10/31/2021  3:30 PM [facility] CCM PHARMACIST [facility]  11/11/2021  9:30 AM [facility] HEALTH COACH [facility]    Lab/Order associations:   ICD-10-CM   1. Hyperlipidemia, unspecified hyperlipidemia type  E78.5   2. Gastroesophageal reflux disease without esophagitis  K21.9   3. BPH associated with nocturia  N40.1    R35.1   4. Chronic idiopathic gout involving toe without tophus, unspecified laterality  M1A.0790   5. Aortic atherosclerosis (HCC)  I70.0   6. Senile purpura (HCC) Chronic.  Noted/stable- discussed using cane to avoid falls D69.2  Meds ordered this encounter  Medications  . Vitamin D, Ergocalciferol, (DRISDOL) 1.25 MG (50000 UNIT) CAPS capsule    Sig: Take 1 capsule (50,000 Units total) by mouth every 7 (seven) days.    Dispense:  12 capsule    Refill:  0   I,Jordan Kelly,acting as a scribe for Garret Reddish, MD.,have documented all relevant documentation on the behalf of Garret Reddish, MD,as directed by  Garret Reddish, MD while in the presence of Garret Reddish, MD.   I, Garret Reddish, MD, have reviewed all documentation for this visit. The documentation on 04/27/21 for the exam, diagnosis, procedures, and orders are all accurate and complete.   Return precautions advised. Garret Reddish, MD

## 2021-07-08 ENCOUNTER — Telehealth: Payer: Self-pay | Admitting: Pharmacist

## 2021-07-08 NOTE — Chronic Care Management (AMB) (Addendum)
    Chronic Care Management Pharmacy Assistant   Name: [name]  MRN: [id number] DOB: [birth year]   Reason for Encounter: General Adherence Call    Recent office visits:  04/27/2021 [facility] (PCP) Marin Olp, MD; start Vitamin D 50000 units per week then convert to 1000 units per day.  Recent consult visits:  None  Hospital visits:  None in previous 6 months  Medications: Outpatient Encounter Medications as of 07/08/2021  Medication Sig Note   acetaminophen (TYLENOL) 500 MG tablet Take 1 tablet (500 mg total) by mouth every 6 (six) hours as needed for mild pain or moderate pain (or Fever >/= 101). (Patient not taking: Reported on 04/27/2021)    allopurinol (ZYLOPRIM) 100 MG tablet Take 100 mg by mouth 2 (two) times daily.     colchicine 0.6 MG tablet Take 0.6 mg by mouth as needed. 11/05/2020: As needed    finasteride (PROSCAR) 5 MG tablet Take 5 mg by mouth daily.    Multiple Vitamin (MULTIVITAMIN ADULT PO) Take by mouth.    omeprazole (PRILOSEC) 20 MG capsule Take 20 mg by mouth daily as needed.     simvastatin (ZOCOR) 40 MG tablet Take 20 mg by mouth at bedtime.     solifenacin (VESICARE) 10 MG tablet Take 5 mg by mouth daily. 03/28/2021: Has not started - will start once rec'd by [facility] mail order.    Vitamin D, Ergocalciferol, (DRISDOL) 1.25 MG (50000 UNIT) CAPS capsule Take 1 capsule (50,000 Units total) by mouth every 7 (seven) days.    No facility-administered encounter medications on file as of 07/08/2021.    Patient Questions: Have you had any problems recently with your health? Patient stated "I am up and about, able to take nourishments. I am having trouble with balance. I have to be really careful about falling down."  Have you had any problems with your pharmacy? Patient receives his medications from the [place].  What issues or side effects are you having with your medications? Patient states he is not currently having any issues or side effects with any of his  medications.  What would you like me to pass along to Leata Mouse, CPP for him to help you with?  Patients wife would like to know if the patient would benefit from having another booster vaccine for covid. Patient states he has had 4 covid vaccinations so far. He would like to know if he should have 5?  What can we do to take care of you better? Patient did not have any suggestions.  Future Appointments  Date Time Provider [place]  10/31/2021  3:30 PM [facility] CCM PHARMACIST [facility]  11/03/2021 10:40 AM Marin Olp, MD [facility]  11/11/2021  9:30 AM [facility] HEALTH COACH [facility]     Star Rating Drugs: Patient receives medications from the [place] [place] [facility] [place] Pharmacist Assistant [phone]   10 minutes spent in review, coordination, and documentation. Currently two boosters after the first series is the recommendation for immunocompromised.  Reviewed by: Beverly Milch, PharmD Clinical Pharmacist [phone]

## 2021-07-14 DIAGNOSIS — C4441 Basal cell carcinoma of skin of scalp and neck: Secondary | ICD-10-CM | POA: Diagnosis not present

## 2021-07-14 DIAGNOSIS — D1801 Hemangioma of skin and subcutaneous tissue: Secondary | ICD-10-CM | POA: Diagnosis not present

## 2021-07-14 DIAGNOSIS — L812 Freckles: Secondary | ICD-10-CM | POA: Diagnosis not present

## 2021-07-14 DIAGNOSIS — Z85828 Personal history of other malignant neoplasm of skin: Secondary | ICD-10-CM | POA: Diagnosis not present

## 2021-07-14 DIAGNOSIS — D485 Neoplasm of uncertain behavior of skin: Secondary | ICD-10-CM | POA: Diagnosis not present

## 2021-07-14 DIAGNOSIS — L821 Other seborrheic keratosis: Secondary | ICD-10-CM | POA: Diagnosis not present

## 2021-07-14 DIAGNOSIS — L723 Sebaceous cyst: Secondary | ICD-10-CM | POA: Diagnosis not present

## 2021-08-24 ENCOUNTER — Other Ambulatory Visit (HOSPITAL_BASED_OUTPATIENT_CLINIC_OR_DEPARTMENT_OTHER): Payer: Self-pay

## 2021-08-24 ENCOUNTER — Ambulatory Visit: Payer: Medicare Other | Attending: Internal Medicine

## 2021-08-24 DIAGNOSIS — Z23 Encounter for immunization: Secondary | ICD-10-CM

## 2021-08-24 NOTE — Progress Notes (Signed)
   Covid-19 Vaccination Clinic  Name:  Alexandr Yaworski    MRN: [id number] DOB: 1926-10-08  08/24/2021  Mr. Wakeman was observed post Covid-19 immunization for 15 minutes without incident. He was provided with Vaccine Information Sheet and instruction to access the V-Safe system.   Mr. Gutridge was instructed to call 911 with any severe reactions post vaccine: Difficulty breathing  Swelling of face and throat  A fast heartbeat  A bad rash all over body  Dizziness and weakness

## 2021-09-15 DIAGNOSIS — Z23 Encounter for immunization: Secondary | ICD-10-CM | POA: Diagnosis not present

## 2021-10-25 NOTE — Progress Notes (Deleted)
Chronic Care Management Pharmacy Note  10/25/2021 Name:  Allen Robinson MRN:  [id number] DOB:  Dec 17, 1925  Subjective: Allen Robinson is an 87 y.o. year old male who is a primary patient of Hunter, Brayton Mars, MD.  The CCM team was consulted for assistance with disease management and care coordination needs.    Engaged with patient by telephone for initial visit in response to provider referral for pharmacy case management and/or care coordination services.   Consent to Services:  The patient was given the following information about Chronic Care Management services today, agreed to services, and gave verbal consent: 1. CCM service includes personalized support from designated clinical staff supervised by the primary care provider, including individualized plan of care and coordination with other care providers 2. 24/7 contact phone numbers for assistance for urgent and routine care needs. 3. Service will only be billed when office clinical staff spend 20 minutes or more in a month to coordinate care. 4. Only one practitioner may furnish and bill the service in a calendar month. 5.The patient may stop CCM services at any time (effective at the end of the month) by phone call to the office staff. 6. The patient will be responsible for cost sharing (co-pay) of up to 20% of the service fee (after annual deductible is met). Patient agreed to services and consent obtained.  Patient Care Team: Marin Olp, MD as PCP - General (Family Medicine) Clinic, Thayer [place] as Consulting Physician Jarome Matin, MD as Consulting Physician (Dermatology) Madelin Rear, [facility] as Pharmacist (Pharmacist)  Recent office visits:  04/27/2021 OV (PCP) Marin Olp, MD; start Vitamin D 50000 units per week then convert to 1000 units per day.   Recent consult visits:  None   Hospital visits:  None in previous 6 months  Lab Results  Component Value Date   CREATININE 1.1 01/24/2021    CREATININE 1.0 12/11/2019   CREATININE 0.93 05/21/2019    Lab Results  Component Value Date   HGBA1C 6.2 01/24/2021   Last diabetic Eye exam: No results found for: HMDIABEYEEXA  Last diabetic Foot exam: No results found for: HMDIABFOOTEX      Component Value Date/Time   CHOL 143 01/24/2021 0000   TRIG 107 01/24/2021 0000   HDL 43 01/24/2021 0000   LDLCALC 79 01/24/2021 0000    Hepatic Function Latest Ref Rng & Units 01/24/2021 12/11/2019 05/21/2019  Total Protein 6.0 - 8.3 g/dL - - 6.2  Albumin 3.5 - 5.0 3.6 3.7 4.0  AST 14 - 40 '23 18 19  ' ALT 10 - 40 '28 23 19  ' Alk Phosphatase 25 - 125 92 101 98  Total Bilirubin 0.2 - 1.2 mg/dL - - 0.6  Bilirubin, Direct 0.01 - 0.4 0.2 0.1 -    Lab Results  Component Value Date/Time   TSH 3.78 01/24/2021 12:00 AM   TSH 4.110 02/16/2016 10:24 AM    CBC Latest Ref Rng & Units 01/24/2021 12/11/2019 05/21/2019  WBC - 7.4 6.6 7.4  Hemoglobin 13.5 - 17.5 13.2(A) 14.0 12.9(L)  Hematocrit 41 - 53 40(A) 43 38.3(L)  Platelets 150 - 399 158 149(A) 167.0    Lab Results  Component Value Date/Time   VD25OH 27.55 01/24/2021 12:00 AM   VD25OH 36.67 05/21/2019 11:23 AM   VD25OH 31.73 01/27/2019 12:00 AM   Clinical ASCVD: No  The ASCVD Risk score (Arnett DK, et al., 2019) failed to calculate for the following reasons:   The 2019 ASCVD risk score  is only valid for ages 48 to 94    Social History   Tobacco Use  Smoking Status Never  Smokeless Tobacco Never   BP Readings from Last 3 Encounters:  04/27/21 126/74  11/02/20 119/65  05/03/20 118/60   Pulse Readings from Last 3 Encounters:  04/27/21 70  11/02/20 (!) 57  05/03/20 61   Wt Readings from Last 3 Encounters:  04/27/21 169 lb (76.7 kg)  11/02/20 169 lb (76.7 kg)  05/03/20 168 lb 6.4 oz (76.4 kg)    Assessment: Review of patient past medical history, allergies, medications, health status, including review of consultants reports, laboratory and other test data, was performed as part of  comprehensive evaluation and provision of chronic care management services.   SDOH:  (Social Determinants of Health) assessments and interventions performed: Yes.  CCM Care Plan  No Known Allergies  Medications Reviewed Today     Reviewed by Linton Ham, CMA (Certified Medical Assistant) on 04/27/21 at [phone]  Med List Status: <None>   Medication Order Taking? Sig Documenting Provider Last Dose Status Informant  acetaminophen (TYLENOL) 500 MG tablet [id number] No Take 1 tablet (500 mg total) by mouth every 6 (six) hours as needed for mild pain or moderate pain (or Fever >/= 101).  Patient not taking: Reported on 04/27/2021   Domenic Polite, MD Not Taking Active   allopurinol (ZYLOPRIM) 100 MG tablet [id number] Yes Take 100 mg by mouth 2 (two) times daily.  [provider] Taking Active Self  colchicine 0.6 MG tablet [id number] Yes Take 0.6 mg by mouth as needed. [provider] Taking Active            Med Note Willette Brace   Fri Nov 05, 2020  9:37 AM) As needed   finasteride (PROSCAR) 5 MG tablet [id number] Yes Take 5 mg by mouth daily. [provider] Taking Active Self  Multiple Vitamin (MULTIVITAMIN ADULT PO) [id number] Yes Take by mouth. [provider] Taking Active   omeprazole (PRILOSEC) 20 MG capsule [id number] Yes Take 20 mg by mouth daily as needed.  [provider] Taking Active Self  simvastatin (ZOCOR) 40 MG tablet [id number] Yes Take 20 mg by mouth at bedtime.  [provider] Taking Active Self  solifenacin (VESICARE) 10 MG tablet [id number]  Take 5 mg by mouth daily. [provider]  Active Self           Med Note Ermalinda Memos Mar 28, 2021  3:09 PM) Has not started - will start once rec'd by [facility] mail order.             Patient Active Problem List   Diagnosis Date Noted   Senile purpura ([place]) 05/03/2020   Aortic atherosclerosis ([place]) 05/21/2019   Right rib fracture 05/15/2019    Hyperglycemia 04/28/2019   Gout 03/14/2017   Vitamin D deficiency 03/14/2017   BPH associated with nocturia 03/14/2017   Hyperlipidemia 03/14/2017   GERD (gastroesophageal reflux disease) 03/14/2017   Peripheral polyneuropathy 02/16/2016   Diplopia 11/19/2015    Immunization History  Administered Date(s) Administered   Fluad Quad(high Dose 65+) 09/20/2020   Influenza, High Dose Seasonal PF 09/04/2017, 09/12/2018   Influenza,inj,Quad PF,6+ Mos 09/08/2016   Moderna Covid-19 Vaccine Bivalent Booster 18yr & up 08/24/2021   Moderna Sars-Covid-2 Vaccination 12/11/2019, 01/15/2020, 09/20/2020   PFIZER Comirnaty(Gray Top)Covid-19 Tri-Sucrose Vaccine 04/11/2021   Pneumococcal Conjugate-13 01/23/2014   Pneumococcal-Unspecified 08/20/2000   Td 04/30/2017   Tdap 11/21/2010  Conditions to be addressed/monitored:   HLD, Aortic Atherosclerosis, Gout, BPH, Hyperglycemia, Vitamin D deficiency   There are no care plans that you recently modified to display for this patient.  Future Appointments  Date Time Provider [place]  10/31/2021  3:30 PM [facility] CCM PHARMACIST [facility]  11/03/2021 10:40 AM Marin Olp, MD [facility]  11/25/2021 10:15 AM [facility] HEALTH COACH [facility] [place], PharmD Clinical Pharmacist  [place] [phone]   Current Barriers:  Medication related questions - BPH HLD  Pharmacist Clinical Goal(s):  Patient will contact provider office for questions/concerns as evidenced notation of same in electronic health record through collaboration with PharmD and provider.   Interventions: 1:1 collaboration with Marin Olp, MD regarding development and update of comprehensive plan of care as evidenced by provider attestation and co-signature Inter-disciplinary care team collaboration (see longitudinal plan of care) Comprehensive medication review performed; medication list updated in electronic medical record No  medication changes this visit  Hyperlipidemia: (LDL goal < 70) -Reasonably controlled due to age and LDL <100  -04/2019 CT - abdominal aortic atherosclerosis without aneurysm  -Current treatment: Simvastatin 40 mg once daily -Medications previously tried: simvastatin 20 mg once daily   -Current dietary patterns: enjoys eating - meat and potatoes. Portion control -Current exercise habits: PT exercises, stationary bike, yard work.  -Educated on Cholesterol goals; answered questions related to simvastatin and aspirin  -Recommended to continue current medication  GERD (Goal: minimize symptoms) -Controlled -Triggers might be tomato sauce or spicy foods, omeprazole works when taken proactively on as needed basis. Is happy with overall acid reflux control  -Current treatment  Omeprazole 20 mg once daily - takes as needed -Reviewed triggers  -Recommended to continue current medication due to infrequent use  BPH (Goal: minimize symptoms, optimize medication safety) -Controlled -Hx of falling at at home  -Current treatment  Finasteride 5 mg once daily Vesicare 5 mg once daily (plans to start somepoint 03/2021 - coordinated care through [place] - Dr Gaynelle Arabian at [facility]) -Clarified questions related to [place], which he will be getting through the [place], is ok with getting through mail and understands this does not necessarily need to be started right away. Counseled on potential side effects - expresses understanding of anticholingeric related side effects  Gout (Goal: minimize symptoms) -Controlled -Gout has rarely been an issue for patient  -Current treatment  Allopurinol 100 mg - two tabs daily  Colchicine 0.6 mg - cuts in half (0.41m) as needed -Reviewed diet - no specific activity to limit purine rich foods -Recommended to continue current medication  Patient Goals/Self-Care Activities Patient will:  - take medications as prescribed  Medication Assistance: None required.  Patient affirms  current coverage meets needs. [facility] [place] [place]Phone: [phone]: [phone] Follow Up:  Patient agrees to Care Plan and Follow-up. Plan: [facility] f/u 6-8 months

## 2021-10-31 ENCOUNTER — Telehealth: Payer: Medicare Other

## 2021-10-31 NOTE — Progress Notes (Signed)
Chronic Care Management Pharmacy Note  10/25/2021 Name:  Allen Robinson:  [id number] DOB:  February 01, 1926  Subjective: Allen Robinson is an 87 y.o. year old male who is a primary patient of Hunter, Brayton Mars, MD.  The CCM team was consulted for assistance with disease management and care coordination needs.    Engaged with patient by telephone for follow up in response to provider referral for pharmacy case management and/or care coordination services.   Consent to Services:  The patient was given the following information about Chronic Care Management services today, agreed to services, and gave verbal consent: 1. CCM service includes personalized support from designated clinical staff supervised by the primary care provider, including individualized plan of care and coordination with other care providers 2. 24/7 contact phone numbers for assistance for urgent and routine care needs. 3. Service will only be billed when office clinical staff spend 20 minutes or more in a month to coordinate care. 4. Only one practitioner may furnish and bill the service in a calendar month. 5.The patient may stop CCM services at any time (effective at the end of the month) by phone call to the office staff. 6. The patient will be responsible for cost sharing (co-pay) of up to 20% of the service fee (after annual deductible is met). Patient agreed to services and consent obtained.  Patient Care Team: Marin Olp, MD as PCP - General (Family Medicine) Clinic, [facility] [place] as Consulting Physician Jarome Matin, MD as Consulting Physician (Dermatology) Madelin Rear, [facility] as Pharmacist (Pharmacist)  Recent office visits:  04/27/2021 OV (PCP) Marin Olp, MD; start Vitamin D 50000 units per week then convert to 1000 units per day.   Recent consult visits:  None   Hospital visits:  None in previous 6 months  Lab Results  Component Value Date   CREATININE 1.1 01/24/2021   CREATININE  1.0 12/11/2019   CREATININE 0.93 05/21/2019    Lab Results  Component Value Date   HGBA1C 6.2 01/24/2021   Last diabetic Eye exam: No results found for: HMDIABEYEEXA  Last diabetic Foot exam: No results found for: HMDIABFOOTEX      Component Value Date/Time   CHOL 143 01/24/2021 0000   TRIG 107 01/24/2021 0000   HDL 43 01/24/2021 0000   LDLCALC 79 01/24/2021 0000    Hepatic Function Latest Ref Rng & Units 01/24/2021 12/11/2019 05/21/2019  Total Protein 6.0 - 8.3 g/dL - - 6.2  Albumin 3.5 - 5.0 3.6 3.7 4.0  AST 14 - 40 '23 18 19  ' ALT 10 - 40 '28 23 19  ' Alk Phosphatase 25 - 125 92 101 98  Total Bilirubin 0.2 - 1.2 mg/dL - - 0.6  Bilirubin, Direct 0.01 - 0.4 0.2 0.1 -    Lab Results  Component Value Date/Time   TSH 3.78 01/24/2021 12:00 AM   TSH 4.110 02/16/2016 10:24 AM    CBC Latest Ref Rng & Units 01/24/2021 12/11/2019 05/21/2019  WBC - 7.4 6.6 7.4  Hemoglobin 13.5 - 17.5 13.2(A) 14.0 12.9(L)  Hematocrit 41 - 53 40(A) 43 38.3(L)  Platelets 150 - 399 158 149(A) 167.0    Lab Results  Component Value Date/Time   VD25OH 27.55 01/24/2021 12:00 AM   VD25OH 36.67 05/21/2019 11:23 AM   VD25OH 31.73 01/27/2019 12:00 AM   Clinical ASCVD: No  The ASCVD Risk score (Arnett DK, et al., 2019) failed to calculate for the following reasons:   The 2019 ASCVD risk score is  only valid for ages 23 to 39    Social History   Tobacco Use  Smoking Status Never  Smokeless Tobacco Never   BP Readings from Last 3 Encounters:  04/27/21 126/74  11/02/20 119/65  05/03/20 118/60   Pulse Readings from Last 3 Encounters:  04/27/21 70  11/02/20 (!) 57  05/03/20 61   Wt Readings from Last 3 Encounters:  04/27/21 169 lb (76.7 kg)  11/02/20 169 lb (76.7 kg)  05/03/20 168 lb 6.4 oz (76.4 kg)    Assessment: Review of patient past medical history, allergies, medications, health status, including review of consultants reports, laboratory and other test data, was performed as part of comprehensive  evaluation and provision of chronic care management services.   SDOH:  (Social Determinants of Health) assessments and interventions performed: Yes.  CCM Care Plan  No Known Allergies  Medications Reviewed Today     Reviewed by Linton Ham, CMA (Certified Medical Assistant) on 04/27/21 at [phone]  Med List Status: <None>   Medication Order Taking? Sig Documenting Provider Last Dose Status Informant  acetaminophen (TYLENOL) 500 MG tablet [id number] No Take 1 tablet (500 mg total) by mouth every 6 (six) hours as needed for mild pain or moderate pain (or Fever >/= 101).  Patient not taking: Reported on 04/27/2021   Allen Polite, MD Not Taking Active   allopurinol (ZYLOPRIM) 100 MG tablet [id number] Yes Take 100 mg by mouth 2 (two) times daily.  [provider] Taking Active Self  colchicine 0.6 MG tablet [id number] Yes Take 0.6 mg by mouth as needed. [provider] Taking Active            Med Note Willette Brace   Fri Nov 05, 2020  9:37 AM) As needed   finasteride (PROSCAR) 5 MG tablet [id number] Yes Take 5 mg by mouth daily. [provider] Taking Active Self  Multiple Vitamin (MULTIVITAMIN ADULT PO) [id number] Yes Take by mouth. [provider] Taking Active   omeprazole (PRILOSEC) 20 MG capsule [id number] Yes Take 20 mg by mouth daily as needed.  [provider] Taking Active Self  simvastatin (ZOCOR) 40 MG tablet [id number] Yes Take 20 mg by mouth at bedtime.  [provider] Taking Active Self  solifenacin (VESICARE) 10 MG tablet [id number]  Take 5 mg by mouth daily. [provider]  Active Self           Med Note Ermalinda Memos Mar 28, 2021  3:09 PM) Has not started - will start once rec'd by [facility] mail order.             Patient Active Problem List   Diagnosis Date Noted   Senile purpura ([place]) 05/03/2020   Aortic atherosclerosis ([place]) 05/21/2019   Right rib fracture 05/15/2019   Hyperglycemia 04/28/2019    Gout 03/14/2017   Vitamin D deficiency 03/14/2017   BPH associated with nocturia 03/14/2017   Hyperlipidemia 03/14/2017   GERD (gastroesophageal reflux disease) 03/14/2017   Peripheral polyneuropathy 02/16/2016   Diplopia 11/19/2015    Immunization History  Administered Date(s) Administered   Fluad Quad(high Dose 65+) 09/20/2020   Influenza, High Dose Seasonal PF 09/04/2017, 09/12/2018   Influenza,inj,Quad PF,6+ Mos 09/08/2016   Moderna Covid-19 Vaccine Bivalent Booster 42yr & up 08/24/2021   Moderna Sars-Covid-2 Vaccination 12/11/2019, 01/15/2020, 09/20/2020   PFIZER Comirnaty(Gray Top)Covid-19 Tri-Sucrose Vaccine 04/11/2021   Pneumococcal Conjugate-13 01/23/2014   Pneumococcal-Unspecified 08/20/2000   Td 04/30/2017   Tdap 11/21/2010  Conditions to be addressed/monitored:  HLD, Aortic Atherosclerosis, Gout, BPH, Hyperglycemia, Vitamin D deficiency   Patient Care Plan: CCM Pharmacy Care Plan     Problem Identified: Aortic Atherosclerosis, Gout, BPH, Hyperglycemia, Vitamin D deficiency   Priority: High     Long-Range Goal: Disease Management   Start Date: 03/28/2021  Expected End Date: 03/28/2022  Recent Progress: On track  Priority: High  Note:   Current Barriers:  Medication related questions - BPH HLD  Pharmacist Clinical Goal(s):  Patient will contact provider office for questions/concerns as evidenced notation of same in electronic health record through collaboration with PharmD and provider.   Interventions: 1:1 collaboration with Marin Olp, MD regarding development and update of comprehensive plan of care as evidenced by provider attestation and co-signature Inter-disciplinary care team collaboration (see longitudinal plan of care) Comprehensive medication review performed; medication list updated in electronic medical record No medication changes this visit  Hyperlipidemia: (LDL goal < 70) -Reasonably controlled due to age and LDL <100  -04/2019 CT  - abdominal aortic atherosclerosis without aneurysm  -Current treatment: Simvastatin 40 mg once daily -Medications previously tried: simvastatin 20 mg once daily   -Current dietary patterns: enjoys eating - meat and potatoes. Portion control -Current exercise habits: PT exercises, stationary bike, yard work.  -Educated on Cholesterol goals; answered questions related to simvastatin and aspirin  -Recommended to continue current medication  Update 11/07/21 Patient continues to take simvastatin as directed, still no adverse effects. Same exercise as previous. Most recent LDL is 69 which is reasonable for his age. No changes needed at this time continue routine screening.  GERD (Goal: minimize symptoms) -Controlled -Triggers might be tomato sauce or spicy foods, omeprazole works when taken proactively on as needed basis. Is happy with overall acid reflux control  -Current treatment  Omeprazole 20 mg once daily - takes as needed -Reviewed triggers  -Recommended to continue current medication due to infrequent use  Update 11/07/21 Continues to use prn. No recent symptoms. I think he is fine to continue as is due to age and prn use. Work on triggers that may be causing any symptoms. HC follow up in 3 months to check on symptoms.  BPH (Goal: minimize symptoms, optimize medication safety) -Controlled -Hx of falling at at home  -Current treatment  Finasteride 5 mg once daily Vesicare 5 mg once daily (plans to start somepoint 03/2021 - coordinated care through [place] - Dr Gaynelle Arabian at [facility]) -Clarified questions related to [place], which he will be getting through the [place], is ok with getting through mail and understands this does not necessarily need to be started right away. Counseled on potential side effects - expresses understanding of anticholingeric related side effects  Gout (Goal: minimize symptoms) -Controlled -Gout has rarely been an issue for patient  -Current treatment   Allopurinol 100 mg - two tabs daily  Colchicine 0.6 mg - cuts in half (0.77m) as needed -Reviewed diet - no specific activity to limit purine rich foods -Recommended to continue current medication  Patient Goals/Self-Care Activities Patient will:  - take medications as prescribed  Medication Assistance: None required.  Patient affirms current coverage meets needs. [facility] [place] [place]Phone: [phone]: [phone] Follow Up:  Patient agrees to Care Plan and Follow-up. [facility]         Future Appointments  Date Time Provider [place] 10/31/2021  3:30  PM [facility] CCM PHARMACIST [facility]  11/03/2021 10:40 AM Marin Olp, MD [facility]  11/25/2021 10:15 AM [facility] HEALTH COACH [facility] [place], PharmD Clinical Pharmacist  [facility] [phone]

## 2021-11-03 ENCOUNTER — Ambulatory Visit (INDEPENDENT_AMBULATORY_CARE_PROVIDER_SITE_OTHER): Payer: Medicare Other | Admitting: Family Medicine

## 2021-11-03 ENCOUNTER — Other Ambulatory Visit: Payer: Self-pay

## 2021-11-03 ENCOUNTER — Encounter: Payer: Self-pay | Admitting: Family Medicine

## 2021-11-03 VITALS — BP 120/60 | HR 65 | Temp 97.3°F | Ht 70.0 in | Wt 166.2 lb

## 2021-11-03 DIAGNOSIS — E559 Vitamin D deficiency, unspecified: Secondary | ICD-10-CM | POA: Diagnosis not present

## 2021-11-03 DIAGNOSIS — I700 Atherosclerosis of aorta: Secondary | ICD-10-CM

## 2021-11-03 DIAGNOSIS — K219 Gastro-esophageal reflux disease without esophagitis: Secondary | ICD-10-CM

## 2021-11-03 DIAGNOSIS — R739 Hyperglycemia, unspecified: Secondary | ICD-10-CM | POA: Diagnosis not present

## 2021-11-03 DIAGNOSIS — E785 Hyperlipidemia, unspecified: Secondary | ICD-10-CM | POA: Diagnosis not present

## 2021-11-03 DIAGNOSIS — R351 Nocturia: Secondary | ICD-10-CM | POA: Diagnosis not present

## 2021-11-03 DIAGNOSIS — N401 Enlarged prostate with lower urinary tract symptoms: Secondary | ICD-10-CM

## 2021-11-03 DIAGNOSIS — M1A0790 Idiopathic chronic gout, unspecified ankle and foot, without tophus (tophi): Secondary | ICD-10-CM | POA: Diagnosis not present

## 2021-11-03 LAB — CBC WITH DIFFERENTIAL/PLATELET
Basophils Absolute: 0 10*3/uL (ref 0.0–0.1)
Basophils Relative: 0.5 % (ref 0.0–3.0)
Eosinophils Absolute: 0.1 10*3/uL (ref 0.0–0.7)
Eosinophils Relative: 2 % (ref 0.0–5.0)
HCT: 39.4 % (ref 39.0–52.0)
Hemoglobin: 13.1 g/dL (ref 13.0–17.0)
Lymphocytes Relative: 21.6 % (ref 12.0–46.0)
Lymphs Abs: 1.3 10*3/uL (ref 0.7–4.0)
MCHC: 33.3 g/dL (ref 30.0–36.0)
MCV: 93.9 fl (ref 78.0–100.0)
Monocytes Absolute: 0.6 10*3/uL (ref 0.1–1.0)
Monocytes Relative: 10.4 % (ref 3.0–12.0)
Neutro Abs: 4.1 10*3/uL (ref 1.4–7.7)
Neutrophils Relative %: 65.5 % (ref 43.0–77.0)
Platelets: 148 10*3/uL — ABNORMAL LOW (ref 150.0–400.0)
RBC: 4.2 Mil/uL — ABNORMAL LOW (ref 4.22–5.81)
RDW: 14.3 % (ref 11.5–15.5)
WBC: 6.2 10*3/uL (ref 4.0–10.5)

## 2021-11-03 LAB — COMPREHENSIVE METABOLIC PANEL
ALT: 20 U/L (ref 0–53)
AST: 24 U/L (ref 0–37)
Albumin: 4 g/dL (ref 3.5–5.2)
Alkaline Phosphatase: 84 U/L (ref 39–117)
BUN: 22 mg/dL (ref 6–23)
CO2: 27 mEq/L (ref 19–32)
Calcium: 9.1 mg/dL (ref 8.4–10.5)
Chloride: 107 mEq/L (ref 96–112)
Creatinine, Ser: 1.02 mg/dL (ref 0.40–1.50)
GFR: 63.27 mL/min (ref >60.00)
Glucose, Bld: 94 mg/dL (ref 70–99)
Potassium: 4.8 mEq/L (ref 3.5–5.1)
Sodium: 142 mEq/L (ref 135–145)
Total Bilirubin: 0.7 mg/dL (ref 0.2–1.2)
Total Protein: 6.1 g/dL (ref 6.0–8.3)

## 2021-11-03 LAB — VITAMIN D 25 HYDROXY (VIT D DEFICIENCY, FRACTURES): VITD: 35.87 ng/mL (ref 30.00–100.00)

## 2021-11-03 LAB — LIPID PANEL
Cholesterol: 129 mg/dL (ref 0–200)
HDL: 38.4 mg/dL — ABNORMAL LOW (ref >39.00)
LDL Cholesterol: 74 mg/dL (ref 0–99)
NonHDL: 90.84
Total CHOL/HDL Ratio: 3
Triglycerides: 83 mg/dL (ref 0.0–149.0)
VLDL: 16.6 mg/dL (ref 0.0–40.0)

## 2021-11-03 LAB — HEMOGLOBIN A1C: Hgb A1c MFr Bld: 6.3 % (ref 4.6–6.5)

## 2021-11-03 LAB — URIC ACID: Uric Acid, Serum: 4.3 mg/dL (ref 4.0–7.8)

## 2021-11-03 NOTE — Addendum Note (Signed)
Addended by: Marin Olp on: 11/03/2021 11:51 AM   Modules accepted: Level of Service

## 2021-11-03 NOTE — Progress Notes (Addendum)
Patient fell after exiting the building. States he missed the step stepping down off the side walk. He did not bring his cane today as he has been advised to use at all times- encouraged again after fall. Returned to the office with abrasions on the nose, hands, elbow and left knee.   Did have a C-shaped skin tear on the upper bridge of his nose.  Described some pain over the nose but no headaches or blurry vision.  Discussed if develops headaches or blurry vision or weakness should seek care at the hospital-would need CT scan.  He agrees to follow-up if not improving.  Up-to-date on tetanus shot within 5 years.  Discussed using Dermabond on his nose (could have potential fracture) but that there would be associated procedure charge-he declines this.  We opted for Steri-Strips instead.  Discussed follow-up indications for skin surrounding any wounds as well.  Discussed aftercare.  I thanked him for returning into the building so that we could address his fall and make sure he was safe.  He states he has not had a fall since last summer which is encouraging but once again advised using a cane to avoid future falls-stressed the importance of avoiding falls.  Even though he fell on his hands he did not indicate significant hand or wrist pain-we discussed following up and potential imaging if worsening pain in the future.  Time Spent: 48 minutes of total time (10:40- 11 AM, 11: 20 AM- 11:48 AM) was spent on the date of the encounter performing the following actions: chart review prior to seeing the patient, obtaining history, performing a medically necessary exam, counseling on the treatment plan, placing orders, and documenting in our EHR.  Return precautions advised.   Garret Reddish, MD

## 2021-11-03 NOTE — Progress Notes (Addendum)
Phone [phone] In person visit   Subjective:   Allen Robinson is a 87 y.o. year old very pleasant male patient who presents for/with See problem oriented charting Chief Complaint  Patient presents with   Follow-up    Physical?   This visit occurred during the SARS-CoV-2 public health emergency.  Safety protocols were in place, including screening questions prior to the visit, additional usage of staff PPE, and extensive cleaning of exam room while observing appropriate contact time as indicated for disinfecting solutions.   Past Medical History-  Patient Active Problem List   Diagnosis Date Noted   Aortic atherosclerosis ([place]) 05/21/2019    Priority: Medium    Gout 03/14/2017    Priority: Medium    BPH associated with nocturia 03/14/2017    Priority: Medium    Hyperlipidemia 03/14/2017    Priority: Medium    GERD (gastroesophageal reflux disease) 03/14/2017    Priority: Medium    Senile purpura ([place]) 05/03/2020    Priority: Low   Right rib fracture 05/15/2019    Priority: Low   Hyperglycemia 04/28/2019    Priority: Low   Vitamin D deficiency 03/14/2017    Priority: Low   Peripheral polyneuropathy 02/16/2016    Priority: Low   Diplopia 11/19/2015    Priority: Low    Medications- reviewed and updated Current Outpatient Medications  Medication Sig Dispense Refill   acetaminophen (TYLENOL) 500 MG tablet Take 1 tablet (500 mg total) by mouth every 6 (six) hours as needed for mild pain or moderate pain (or Fever >/= 101).     allopurinol (ZYLOPRIM) 100 MG tablet Take 100 mg by mouth 2 (two) times daily.      colchicine 0.6 MG tablet Take 0.6 mg by mouth as needed.     finasteride (PROSCAR) 5 MG tablet Take 5 mg by mouth daily.     Multiple Vitamin (MULTIVITAMIN ADULT PO) Take by mouth.     omeprazole (PRILOSEC) 20 MG capsule Take 20 mg by mouth daily as needed.      simvastatin (ZOCOR) 40 MG tablet Take 20 mg by mouth at bedtime.      solifenacin (VESICARE) 10  MG tablet Take 5 mg by mouth daily.     Vitamin D, Ergocalciferol, (DRISDOL) 1.25 MG (50000 UNIT) CAPS capsule Take 1 capsule (50,000 Units total) by mouth every 7 (seven) days. 12 capsule 0   No current facility-administered medications for this visit.     Objective:  BP 120/60 (BP Location: Left Arm, Patient Position: Sitting, Cuff Size: Normal)    Pulse 65    Temp (!) 97.3 F (36.3 C) (Oral)    Ht 5\' 10"  (1.778 m)    Wt 166 lb 3.2 oz (75.4 kg)    SpO2 95%    BMI 23.85 kg/m  Gen: NAD, resting comfortably CV: RRR no murmurs rubs or gallops Lungs: CTAB no crackles, wheeze, rhonchi Abdomen: soft/nontender/nondistended/normal bowel sounds. No rebound or guarding.  Ext: trace edema Skin: warm, dry Neuro: grossly normal, moves all extremities, uses my  hand for assist onto table due to balance concern. Did not bring cane today (advised to use)       Assessment and Plan   #social update- wife dealing with a fib at age 89- has been somewhat tough on her. Considering moving from [place] sq ft home- considering [place] or [place]  #HM- has had shingrix, pneumonia shot, flu shot he reports- will try to get records- [facility]  #  Balance Issues  S: proprioception issues with neuropathy contribute, cannot go up stairs safely without railings. He has a cane available for use-we have recommended consistent use including again today . Had been feeling more stamina issues compared to baseline. -[facility] not [facility] with [facility] with the [facility] -Has also had a [facility] with the [facility] to evaluate for Parkinson's- sounds like DAT from June 21, 2018 A/P: Ongoing balance issues-encouraged him to continue to use his cane- only using sparingly  -staying active as much as possible  # Hyperlipidemia/aortic atherosclerosis - incidental finding on prior imaging with ideal LDL goal under 70 S:Medication - Simvastatin 20 mg daily at PM Lab Results  Component Value Date   CHOL 143 01/24/2021   HDL 43  01/24/2021   LDLCALC 79 01/24/2021   TRIG 107 01/24/2021  A/P: LDL has been slightly above goal [facility] the [facility]-we discussed goal of 70 or less-at his age we have opted to continue current medication unless there is a significant increase in LDL goal  #Gout-uric acid has been below 6 S: 0 flares in years on Allopurinol 100 mg tablet. Has colchicine available if needed Lab Results  Component Value Date   LABURIC 5.2 03/18/2018  A/P:Considering is not having flares-hold off on  changes in medication at this time but will update uric acid  # GERD S:Medication: sparing Omeprazole 20 mg as needed- only if he knows he is eating a riskier food A/P: Reasonable control with intermittent use-continue current medication  #BPH (Benign Prostatic Hyperplasia) S: Medication:- Finasteride 5 mg from the [place], also on Vesicare 5 mg daily -Nocturia - gets up 3x a night to urinate. A/P: Poor control despite 2 medications-we will continue them for now  # Hyperglycemia/insulin resistance/prediabetes-peak recent A1c 6.3 in 2020 S: Medication:none at present Lab Results  Component Value Date   HGBA1C 6.2 01/24/2021   HGBA1C 6.1 12/11/2019   HGBA1C 6.1 05/21/2019  A/P: A1c has remained in prediabetes range - Continue to monitor -also avoid increasing statin if we have to  #Vitamin D deficiency S: Medication: Patient takes vitamin D- unsure #  units daily-he completed boost of 50,000 units weekly prescribed back in June Last vitamin D Lab Results  Component Value Date   VD25OH 27.55 01/24/2021  A/P: Last level slightly low and will prescribe high-dose for repletion  #Constipation-sparing MiraLAX if needed. Colace if needed.   Recommended follow up: No follow-ups on file. Future Appointments  Date Time Provider [place]  11/07/2021  9:00 AM [facility] CCM PHARMACIST [facility]  11/25/2021 10:15 AM [facility] HEALTH COACH [facility]   Lab/Order associations:   ICD-10-CM   1. Hyperlipidemia,  unspecified hyperlipidemia type  E78.5 Lipid panel    CBC with Differential/Platelet    Comprehensive metabolic panel    2. Chronic idiopathic gout involving toe without tophus, unspecified laterality  M1A.0790 Uric acid    3. Gastroesophageal reflux disease without esophagitis  K21.9     4. BPH associated with nocturia  N40.1    R35.1     5. Hyperglycemia  R73.9 Hemoglobin A1c    6. Aortic atherosclerosis (HCC)  I70.0     7. Vitamin D deficiency  E55.9 VITAMIN D 25 Hydroxy (Vit-D Deficiency, Fractures)     I,Harris Phan,acting as a scribe for Garret Reddish, MD.,have documented all relevant documentation on the behalf of Garret Reddish, MD,as directed by  Garret Reddish, MD while in the presence of Garret Reddish, MD.  I, Garret Reddish, MD, have reviewed all documentation  for this visit. The documentation on 11/03/21 for the exam, diagnosis, procedures, and orders are all accurate and complete.  Time Spent: 48 minutes of total time (10:40- 11 AM, 11: 20 AM- 11:48 AM) was spent on the date of the encounter performing the following actions: chart review prior to seeing the patient, obtaining history, performing a medically necessary exam, counseling on the treatment plan, placing orders, and documenting in our EHR.  Return precautions advised.   Garret Reddish, MD

## 2021-11-03 NOTE — Patient Instructions (Addendum)
Health Maintenance Due  Topic Date Due   Zoster Vaccines- Shingrix (1 of 2) -Sign release of information at the check out desk for copy of  immunization records from [place].  Never done   Pneumonia Vaccine 65+ Years old (2 - PPSV23 if available, else PCV20) - Sign release of information at the check out desk for copy of  immunization records from [place].  01/24/2015   INFLUENZA VACCINE  - Sign release of information at the check out desk for copy of  immunization records from [place].  06/20/2021   Please stop by lab before you go If you have mychart- we will send your results within 3 business days of [place] receiving them.  If you do not have mychart- we will call you about results within 5 business days of [place] receiving them.  *please also note that you will see labs on mychart as soon as they post. I will later go in and write notes on them- will say "notes from Dr. Yong Channel"  Advise regular use of cane with your imbalance issues.  We will hold off on medication changes at this time.  Recommended follow up: Return in about 6 months (around 05/04/2022) for follow up- or sooner if needed.

## 2021-11-07 ENCOUNTER — Ambulatory Visit (INDEPENDENT_AMBULATORY_CARE_PROVIDER_SITE_OTHER): Payer: Medicare Other | Admitting: Pharmacist

## 2021-11-07 DIAGNOSIS — K219 Gastro-esophageal reflux disease without esophagitis: Secondary | ICD-10-CM

## 2021-11-07 DIAGNOSIS — E785 Hyperlipidemia, unspecified: Secondary | ICD-10-CM

## 2021-11-07 NOTE — Progress Notes (Signed)
Results reviewed by pt via Mychart " Seen by patient Allen Robinson on 11/05/2021 11:39 AM"

## 2021-11-08 ENCOUNTER — Telehealth: Payer: Self-pay

## 2021-11-08 NOTE — Patient Instructions (Addendum)
Visit Information   Goals Addressed             This Visit's Progress    Manage My Medicine       Timeframe:  Long-Range Goal Priority:  High Start Date:  11/08/21                           Expected End Date: 05/09/22                       Follow Up Date 02/06/22    - call for medicine refill 2 or 3 days before it runs out - call if I am sick and can't take my medicine - keep a list of all the medicines I take; vitamins and herbals too    Why is this important?   These steps will help you keep on track with your medicines.   Notes:        Patient Care Plan: CCM Pharmacy Care Plan     Problem Identified: Aortic Atherosclerosis, Gout, BPH, Hyperglycemia, Vitamin D deficiency   Priority: High     Long-Range Goal: Disease Management   Start Date: 03/28/2021  Expected End Date: 03/28/2022  Recent Progress: On track  Priority: High  Note:   Current Barriers:  Medication related questions - BPH HLD  Pharmacist Clinical Goal(s):  Patient will contact provider office for questions/concerns as evidenced notation of same in electronic health record through collaboration with PharmD and provider.   Interventions: 1:1 collaboration with Marin Olp, MD regarding development and update of comprehensive plan of care as evidenced by provider attestation and co-signature Inter-disciplinary care team collaboration (see longitudinal plan of care) Comprehensive medication review performed; medication list updated in electronic medical record No medication changes this visit  Hyperlipidemia: (LDL goal < 70) -Reasonably controlled due to age and LDL <100  -04/2019 CT - abdominal aortic atherosclerosis without aneurysm  -Current treatment: Simvastatin 40 mg once daily -Medications previously tried: simvastatin 20 mg once daily   -Current dietary patterns: enjoys eating - meat and potatoes. Portion control -Current exercise habits: PT exercises, stationary bike, yard work.   -Educated on Cholesterol goals; answered questions related to simvastatin and aspirin  -Recommended to continue current medication  Update 11/07/21 Patient continues to take simvastatin as directed, still no adverse effects. Same exercise as previous. Most recent LDL is 43 which is reasonable for his age. No changes needed at this time continue routine screening.  GERD (Goal: minimize symptoms) -Controlled -Triggers might be tomato sauce or spicy foods, omeprazole works when taken proactively on as needed basis. Is happy with overall acid reflux control  -Current treatment  Omeprazole 20 mg once daily - takes as needed -Reviewed triggers  -Recommended to continue current medication due to infrequent use  Update 11/07/21 Continues to use prn. No recent symptoms. I think he is fine to continue as is due to age and prn use. Work on triggers that may be causing any symptoms. HC follow up in 3 months to check on symptoms.  BPH (Goal: minimize symptoms, optimize medication safety) -Controlled -Hx of falling at at home  -Current treatment  Finasteride 5 mg once daily Vesicare 5 mg once daily (plans to start somepoint 03/2021 - coordinated care through [place] - Dr Gaynelle Arabian at [facility]) -Clarified questions related to [place], which he will be getting through the [place], is ok with getting through mail and understands this does not necessarily need  to be started right away. Counseled on potential side effects - expresses understanding of anticholingeric related side effects  Gout (Goal: minimize symptoms) -Controlled -Gout has rarely been an issue for patient  -Current treatment  Allopurinol 100 mg - two tabs daily  Colchicine 0.6 mg - cuts in half (0.3mg ) as needed -Reviewed diet - no specific activity to limit purine rich foods -Recommended to continue current medication  Patient Goals/Self-Care Activities Patient will:  - take medications as prescribed  Medication Assistance: None  required.  Patient affirms current coverage meets needs. Patient's preferred pharmacy is: [facility] [address] [place] - [address] [place] [place] [address] Phone: [phone] Fax: [phone]  Follow Up:  Patient agrees to Care Plan and Follow-up. Plan: [facility] f/u 6-8 months         Patient verbalizes understanding of instructions provided today and agrees to view in [place].  Telephone follow up appointment with pharmacy team member scheduled for: 6 months  Edythe Clarity, [place], PharmD Clinical Pharmacist  [facility] [phone]

## 2021-11-08 NOTE — Telephone Encounter (Signed)
Okay to refill? Please advise.  

## 2021-11-08 NOTE — Telephone Encounter (Signed)
His vitamin D health his vitamin D levels are in healthy range-would be reasonable to take 1000 or 2000 units daily over-the-counter

## 2021-11-08 NOTE — Telephone Encounter (Signed)
MEDICATION: Vitamin D, Ergocalciferol, (DRISDOL) 1.25 MG (50000 UNIT) CAPS capsule  PHARMACY:  [facility] [address], [place] [address] Phone:  [phone]  Fax:  [phone]      Comments:   **Let patient know to contact pharmacy at the end of the day to make sure medication is ready. **  ** Please notify patient to allow 48-72 hours to process**  **Encourage patient to contact the pharmacy for refills or they can request refills through [facility]**

## 2021-11-09 NOTE — Telephone Encounter (Signed)
Patient notified and verbalized understanding. 

## 2021-11-11 ENCOUNTER — Ambulatory Visit: Payer: Medicare Other

## 2021-11-19 DIAGNOSIS — N400 Enlarged prostate without lower urinary tract symptoms: Secondary | ICD-10-CM | POA: Diagnosis not present

## 2021-11-19 DIAGNOSIS — E785 Hyperlipidemia, unspecified: Secondary | ICD-10-CM

## 2021-11-25 ENCOUNTER — Other Ambulatory Visit: Payer: Self-pay

## 2021-11-25 ENCOUNTER — Ambulatory Visit (INDEPENDENT_AMBULATORY_CARE_PROVIDER_SITE_OTHER): Payer: Medicare Other

## 2021-11-25 DIAGNOSIS — R2689 Other abnormalities of gait and mobility: Secondary | ICD-10-CM | POA: Diagnosis not present

## 2021-11-25 DIAGNOSIS — Z0000 Encounter for general adult medical examination without abnormal findings: Secondary | ICD-10-CM

## 2021-11-25 NOTE — Patient Instructions (Signed)
Allen Robinson , Thank you for taking time to come for your Medicare Wellness Visit. I appreciate your ongoing commitment to your health goals. Please review the following plan we discussed and let me know if I can assist you in the future.   Screening recommendations/referrals: Colonoscopy: no longer required  Recommended yearly ophthalmology/optometry visit for glaucoma screening and checkup Recommended yearly dental visit for hygiene and checkup  Vaccinations: Influenza vaccine: Done 08/24/21 Pneumococcal vaccine: Up to date Tdap vaccine: Completed 04/30/17 Shingles vaccine: Shingrix discussed. Please contact your pharmacy for coverage information.    Covid-19: Completed 1/21, 2/25, 09/20/20 & 5/23, 08/24/21  Advanced directives: Please bring a copy of your health care power of attorney and living will to the office at your convenience.  Conditions/risks identified: live longer  Next appointment: Follow up in one year for your annual wellness visit.   Preventive Care 58 Years and Older, Male Preventive care refers to lifestyle choices and visits with your health care provider that can promote health and wellness. What does preventive care include? A yearly physical exam. This is also called an annual well check. Dental exams once or twice a year. Routine eye exams. Ask your health care provider how often you should have your eyes checked. Personal lifestyle choices, including: Daily care of your teeth and gums. Regular physical activity. Eating a healthy diet. Avoiding tobacco and drug use. Limiting alcohol use. Practicing safe sex. Taking low doses of aspirin every day. Taking vitamin and mineral supplements as recommended by your health care provider. What happens during an annual well check? The services and screenings done by your health care provider during your annual well check will depend on your age, overall health, lifestyle risk factors, and family history of  disease. Counseling  Your health care provider may ask you questions about your: Alcohol use. Tobacco use. Drug use. Emotional well-being. Home and relationship well-being. Sexual activity. Eating habits. History of falls. Memory and ability to understand (cognition). Work and work Statistician. Screening  You may have the following tests or measurements: Height, weight, and BMI. Blood pressure. Lipid and cholesterol levels. These may be checked every 5 years, or more frequently if you are over 86 years old. Skin check. Lung cancer screening. You may have this screening every year starting at age 86 if you have a 30-pack-year history of smoking and currently smoke or have quit within the past 15 years. Fecal occult blood test (FOBT) of the stool. You may have this test every year starting at age 86. Flexible sigmoidoscopy or colonoscopy. You may have a sigmoidoscopy every 5 years or a colonoscopy every 10 years starting at age 86. Prostate cancer screening. Recommendations will vary depending on your family history and other risks. Hepatitis C blood test. Hepatitis B blood test. Sexually transmitted disease (STD) testing. Diabetes screening. This is done by checking your blood sugar (glucose) after you have not eaten for a while (fasting). You may have this done every 1-3 years. Abdominal aortic aneurysm (AAA) screening. You may need this if you are a current or former smoker. Osteoporosis. You may be screened starting at age 94 if you are at high risk. Talk with your health care provider about your test results, treatment options, and if necessary, the need for more tests. Vaccines  Your health care provider may recommend certain vaccines, such as: Influenza vaccine. This is recommended every year. Tetanus, diphtheria, and acellular pertussis (Tdap, Td) vaccine. You may need a Td booster every 10 years. Zoster vaccine.  You may need this after age 79. Pneumococcal 13-valent  conjugate (PCV13) vaccine. One dose is recommended after age 43. Pneumococcal polysaccharide (PPSV23) vaccine. One dose is recommended after age 30. Talk to your health care provider about which screenings and vaccines you need and how often you need them. This information is not intended to replace advice given to you by your health care provider. Make sure you discuss any questions you have with your health care provider. Document Released: 12/03/2015 Document Revised: 07/26/2016 Document Reviewed: 09/07/2015 Elsevier Interactive Patient Education  2017 [employer] Prevention in the Home Falls can cause injuries. They can happen to people of all ages. There are many things you can do to make your home safe and to help prevent falls. What can I do on the outside of my home? Regularly fix the edges of walkways and driveways and fix any cracks. Remove anything that might make you trip as you walk through a door, such as a raised step or threshold. Trim any bushes or trees on the path to your home. Use bright outdoor lighting. Clear any walking paths of anything that might make someone trip, such as rocks or tools. Regularly check to see if handrails are loose or broken. Make sure that both sides of any steps have handrails. Any raised decks and porches should have guardrails on the edges. Have any leaves, snow, or ice cleared regularly. Use sand or salt on walking paths during winter. Clean up any spills in your garage right away. This includes oil or grease spills. What can I do in the bathroom? Use night lights. Install grab bars by the toilet and in the tub and shower. Do not use towel bars as grab bars. Use non-skid mats or decals in the tub or shower. If you need to sit down in the shower, use a plastic, non-slip stool. Keep the floor dry. Clean up any water that spills on the floor as soon as it happens. Remove soap buildup in the tub or shower regularly. Attach bath mats  securely with double-sided non-slip rug tape. Do not have throw rugs and other things on the floor that can make you trip. What can I do in the bedroom? Use night lights. Make sure that you have a light by your bed that is easy to reach. Do not use any sheets or blankets that are too big for your bed. They should not hang down onto the floor. Have a firm chair that has side arms. You can use this for support while you get dressed. Do not have throw rugs and other things on the floor that can make you trip. What can I do in the kitchen? Clean up any spills right away. Avoid walking on wet floors. Keep items that you use a lot in easy-to-reach places. If you need to reach something above you, use a strong step stool that has a grab bar. Keep electrical cords out of the way. Do not use floor polish or wax that makes floors slippery. If you must use wax, use non-skid floor wax. Do not have throw rugs and other things on the floor that can make you trip. What can I do with my stairs? Do not leave any items on the stairs. Make sure that there are handrails on both sides of the stairs and use them. Fix handrails that are broken or loose. Make sure that handrails are as long as the stairways. Check any carpeting to make sure that it is  firmly attached to the stairs. Fix any carpet that is loose or worn. Avoid having throw rugs at the top or bottom of the stairs. If you do have throw rugs, attach them to the floor with carpet tape. Make sure that you have a light switch at the top of the stairs and the bottom of the stairs. If you do not have them, ask someone to add them for you. What else can I do to help prevent falls? Wear shoes that: Do not have high heels. Have rubber bottoms. Are comfortable and fit you well. Are closed at the toe. Do not wear sandals. If you use a stepladder: Make sure that it is fully opened. Do not climb a closed stepladder. Make sure that both sides of the stepladder  are locked into place. Ask someone to hold it for you, if possible. Clearly mark and make sure that you can see: Any grab bars or handrails. First and last steps. Where the edge of each step is. Use tools that help you move around (mobility aids) if they are needed. These include: Canes. Walkers. Scooters. Crutches. Turn on the lights when you go into a dark area. Replace any light bulbs as soon as they burn out. Set up your furniture so you have a clear path. Avoid moving your furniture around. If any of your floors are uneven, fix them. If there are any pets around you, be aware of where they are. Review your medicines with your doctor. Some medicines can make you feel dizzy. This can increase your chance of falling. Ask your doctor what other things that you can do to help prevent falls. This information is not intended to replace advice given to you by your health care provider. Make sure you discuss any questions you have with your health care provider. Document Released: 09/02/2009 Document Revised: 04/13/2016 Document Reviewed: 12/11/2014 Elsevier Interactive Patient Education  2017 [employer].

## 2021-11-25 NOTE — Progress Notes (Addendum)
Virtual Visit via Telephone Note  I connected with  [name] on 11/25/21 at 10:15 AM EST by telephone and verified that I am speaking with the correct person using two identifiers.  Medicare Annual Wellness visit completed telephonically due to Covid-19 pandemic.   Persons participating in this call: This Health Coach and this patient.   Location: Patient: Home Provider: office   I discussed the limitations, risks, security and privacy concerns of performing an evaluation and management service by telephone and the availability of in person appointments. The patient expressed understanding and agreed to proceed.  Unable to perform video visit due to video visit attempted and failed and/or patient does not have video capability.   Some vital signs may be absent or patient reported.   Willette Brace, LPN   Subjective:   Allen Robinson is a 88 y.o. male who presents for Medicare Annual/Subsequent preventive examination.  Review of Systems     Cardiac Risk Factors include: advanced age (>53men, >79 women);dyslipidemia;male gender     Objective:    There were no vitals filed for this visit. There is no height or weight on file to calculate BMI.  Advanced Directives 11/25/2021 11/22/2020 11/10/2020 11/05/2020 10/28/2019 04/18/2018 03/14/2017  Does Patient Have a Medical Advance Directive? Yes Yes Yes Yes Yes Yes Yes  Type of Paramedic of [place];Living will;Out of facility DNR (pink MOST or yellow form) [place];Living will;Out of facility DNR (pink MOST or yellow form) [place];Living will Living will;Healthcare Power of Attorney - -  Does patient want to make changes to medical advance directive? - No - Patient declined - - No - Patient declined - -  Copy of [place] in Chart? No - copy requested No - copy requested No - copy requested No - copy requested  No - copy requested - -    Current Medications (verified) Outpatient Encounter Medications as of 11/25/2021  Medication Sig   acetaminophen (TYLENOL) 500 MG tablet Take 1 tablet (500 mg total) by mouth every 6 (six) hours as needed for mild pain or moderate pain (or Fever >/= 101).   allopurinol (ZYLOPRIM) 100 MG tablet Take 100 mg by mouth 2 (two) times daily.    finasteride (PROSCAR) 5 MG tablet Take 5 mg by mouth daily.   Multiple Vitamin (MULTIVITAMIN ADULT PO) Take by mouth.   omeprazole (PRILOSEC) 20 MG capsule Take 20 mg by mouth daily as needed.    simvastatin (ZOCOR) 40 MG tablet Take 20 mg by mouth at bedtime.    Vitamin D, Ergocalciferol, (DRISDOL) 1.25 MG (50000 UNIT) CAPS capsule Take 1 capsule (50,000 Units total) by mouth every 7 (seven) days.   colchicine 0.6 MG tablet Take 0.6 mg by mouth as needed. (Patient not taking: Reported on 11/25/2021)   solifenacin (VESICARE) 10 MG tablet Take 5 mg by mouth daily. (Patient not taking: Reported on 11/25/2021)   No facility-administered encounter medications on file as of 11/25/2021.    Allergies (verified) Patient has no known allergies.   History: Past Medical History:  Diagnosis Date   Arthritis    hands   Chicken pox    Hyperlipidemia    Pneumonia    Seasonal allergies    Past Surgical History:  Procedure Laterality Date   TONSILLECTOMY     1936   Family History  Problem Relation Age of Onset   Heart failure Mother    Pneumonia  Mother        died 23   Stroke Father        33   Neuropathy Neg Hx    Social History   Socioeconomic History   Marital status: Married    Spouse name: [name]   Number of children: 2   Years of education: 16   Highest education level: Not on file  Occupational History   Not on file  Tobacco Use   Smoking status: Never   Smokeless tobacco: Never  Substance and Sexual Activity   Alcohol use: Yes    Alcohol/week: 0.0 standard drinks    Comment: no longer drinking. prior social    Drug use: No   Sexual activity: Not on file  Other Topics Concern   Not on file  Social History Narrative   Married. Lives with wife. 2 children- daughter Freight forwarder (2 daughters one will be a Restaurant manager, fast food), daughter Allen Robinson and never came back home- still at [employer] (1 grandson) so 3 grandkids total.        Fought in [place] war- wife was also in the war and veteran.       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    Social Determinants of Health   Financial Resource Strain: Low Risk    Difficulty of Paying Living Expenses: Not hard at all  Food Insecurity: No Food Insecurity   Worried About Charity fundraiser in the Last Year: Never true   [place] in the Last Year: Never true  Transportation Needs: No Transportation Needs   Lack of Transportation (Medical): No   Lack of Transportation (Non-Medical): No  Physical Activity: Inactive   Days of Exercise per Week: 0 days   Minutes of Exercise per Session: 0 min  Stress: No Stress Concern Present   Feeling of Stress : Not at all  Social Connections: Moderately Integrated   Frequency of Communication with Friends and Family: Three times a week   Frequency of Social Gatherings with Friends and Family: More than three times a week   Attends Religious Services: More than 4 times per year   Active Member of [employer] or Organizations: No   Attends Music therapist: Never   Marital Status: Married    Tobacco Counseling Counseling given: Not Answered   Clinical Intake:  Pre-visit preparation completed: Yes  Pain : No/denies pain     BMI - recorded: 23.85 Nutritional Status: BMI of 19-24  Normal Nutritional Risks: None Diabetes: No  How often do you need to have someone help you when you read instructions, pamphlets, or other written materials from your doctor or pharmacy?: 1 - Never  Diabetic?no  Interpreter Needed?: No  Information entered by :: Charlott Rakes, LPN   Activities of  Daily Living In your present state of health, do you have any difficulty performing the following activities: 11/25/2021  Hearing? Y  Comment wears hearing aid  Vision? N  Difficulty concentrating or making decisions? Y  Walking or climbing stairs? Y  Comment can be difficulty  Dressing or bathing? N  Doing errands, shopping? N  Preparing Food and eating ? N  Comment wife prepares meal  Using the Toilet? N  In the past six months, have you accidently leaked urine? N  Do you have problems with loss of bowel control? N  Managing your Medications? N  Managing your Finances? N  Housekeeping or managing your Housekeeping? N  Some recent  data might be hidden    Patient Care Team: Marin Olp, MD as PCP - General (Family Medicine) Clinic, [facility] as Consulting Physician Jarome Matin, MD as Consulting Physician (Dermatology) Edythe Clarity, [facility] (Pharmacist)  Indicate any recent Medical Services you may have received from other than [facility] providers in the past year (date may be approximate).     Assessment:   This is a routine wellness examination for [place].  Hearing/Vision screen Hearing Screening - Comments:: Pt wears a hearing aids  Vision Screening - Comments:: Pt follows up with Dr Delman Cheadle for annual eye exams   Dietary issues and exercise activities discussed: Current Exercise Habits: The patient does not participate in regular exercise at present   Goals Addressed             This Visit's Progress    Patient Stated       Live longer        Depression Screen PHQ 2/9 Scores 11/25/2021 04/27/2021 11/05/2020 11/02/2020 10/28/2019 10/28/2019 04/28/2019  PHQ - 2 Score 0 0 0 0 1 0 0  PHQ- 9 Score - - - - 5 - -    Fall Risk Fall Risk  11/25/2021 04/27/2021 11/05/2020 05/03/2020 10/28/2019  Falls in the past year? 1 1 0 0 1  Comment - - - - -  Number falls in past yr: 1 1 1  0 1  Injury with Fall? 1 1 1  0 1  Comment nose injury - scrapes and bruises - -  Risk  Factor Category  - - - - -  Risk for fall due to : Impaired balance/gait;Impaired vision - History of fall(s);Impaired balance/gait;Impaired vision;Impaired mobility - Impaired mobility;History of fall(s)  Follow up Falls prevention discussed - Falls prevention discussed - Falls evaluation completed;Education provided;Falls prevention discussed    FALL RISK PREVENTION PERTAINING TO THE HOME:  Any stairs in or around the home? Yes  If so, are there any without handrails? No  Home free of loose throw rugs in walkways, pet beds, electrical cords, etc? Yes  Adequate lighting in your home to reduce risk of falls? Yes   ASSISTIVE DEVICES UTILIZED TO PREVENT FALLS:  Life alert? Yes  Use of a cane, walker or w/c? Yes  Grab bars in the bathroom? Yes  Shower chair or bench in shower? Yes  Elevated toilet seat or a handicapped toilet? No   TIMED UP AND GO:  Was the test performed? No .   Cognitive Function: MMSE - Mini Mental State Exam 04/18/2018 03/14/2017  Not completed: (No Data) (No Data)     6CIT Screen 11/25/2021 11/05/2020  What Year? 0 points 0 points  What month? 0 points 0 points  What time? 0 points -  Count back from 20 0 points 0 points  Months in reverse 0 points 0 points  Repeat phrase 0 points 0 points  Total Score 0 -    Immunizations Immunization History  Administered Date(s) Administered   Fluad Quad(high Dose 65+) 09/01/2020, 09/20/2020   H1N1 10/20/2008   Influenza, High Dose Seasonal PF 08/20/2010, 08/20/2013, 08/21/2015, 08/31/2016, 08/20/2017, 09/04/2017, 08/20/2018, 09/12/2018   Influenza,inj,Quad PF,6+ Mos 09/08/2016   Influenza-Unspecified 08/20/2001, 08/21/2003, 09/13/2004, 08/20/2005, 08/20/2006, 08/26/2007, 08/20/2008, 08/20/2009, 08/20/2010, 08/21/2011, 08/25/2014, 08/01/2019, 08/24/2021   Moderna Covid-19 Vaccine Bivalent Booster 53yrs & up 08/24/2021   Moderna Sars-Covid-2 Vaccination 12/11/2019, 01/15/2020, 09/20/2020   PFIZER Comirnaty(Gray  Top)Covid-19 Tri-Sucrose Vaccine 04/11/2021   Pneumococcal Conjugate-13 01/23/2014   Pneumococcal-Unspecified 08/20/2000   Td 04/30/2017  Tdap 11/21/2010, 01/27/2021   Zoster, Live 01/19/2015    TDAP status: Up to date  Flu Vaccine status: Up to date  Pneumococcal vaccine status: Up to date  Covid-19 vaccine status: Completed vaccines  Qualifies for Shingles Vaccine? Yes   Zostavax completed Yes   Shingrix Completed?: No.    Education has been provided regarding the importance of this vaccine. Patient has been advised to call insurance company to determine out of pocket expense if they have not yet received this vaccine. Advised may also receive vaccine at local pharmacy or Health Dept. Verbalized acceptance and understanding.  Screening Tests Health Maintenance  Topic Date Due   Zoster Vaccines- Shingrix (1 of 2) Never done   Pneumonia Vaccine 16+ Years old (2 - PPSV23 if available, else PCV20) 01/24/2015   TETANUS/TDAP  01/28/2031   INFLUENZA VACCINE  Completed   COVID-19 Vaccine  Completed   HPV VACCINES  Aged Out    Health Maintenance  Health Maintenance Due  Topic Date Due   Zoster Vaccines- Shingrix (1 of 2) Never done   Pneumonia Vaccine 26+ Years old (2 - PPSV23 if available, else PCV20) 01/24/2015    Colorectal cancer screening: No longer required.    Additional Screening:   Vision Screening: Recommended annual ophthalmology exams for early detection of glaucoma and other disorders of the eye. Is the patient up to date with their annual eye exam?  Yes  Who is the provider or what is the name of the office in which the patient attends annual eye exams? Dr Delman Cheadle If pt is not established with a provider, would they like to be referred to a provider to establish care? No .   Dental Screening: Recommended annual dental exams for proper oral hygiene  Community Resource Referral / Chronic Care Management: CRR required this visit?  No   CCM required this  visit?  No      Plan:     I have personally reviewed and noted the following in the patients chart:   Medical and social history Use of alcohol, tobacco or illicit drugs  Current medications and supplements including opioid prescriptions. Patient is not currently taking opioid prescriptions. Functional ability and status Nutritional status Physical activity Advanced directives List of other physicians Hospitalizations, surgeries, and ER visits in previous 12 months Vitals Screenings to include cognitive, depression, and falls Referrals and appointments  In addition, I have reviewed and discussed with patient certain preventive protocols, quality metrics, and best practice recommendations. A written personalized care plan for preventive services as well as general preventive health recommendations were provided to patient.     Willette Brace, LPN   11/25/1094   Nurse Notes: Pt stated he never has taken Vesicare and has never received it .He asked if you would like him to take it please give him a call.  He is still going to bathroom a lot at night. Also stated  balance issues  continue to decrease Please advise

## 2021-11-28 NOTE — Addendum Note (Signed)
Addended by: Willette Brace on: 11/28/2021 03:09 PM   Modules accepted: Orders

## 2021-12-05 ENCOUNTER — Ambulatory Visit: Payer: Medicare Other | Admitting: Physical Therapy

## 2021-12-07 ENCOUNTER — Ambulatory Visit: Payer: Medicare Other | Admitting: Physical Therapy

## 2021-12-07 ENCOUNTER — Ambulatory Visit (INDEPENDENT_AMBULATORY_CARE_PROVIDER_SITE_OTHER): Payer: Medicare Other | Admitting: Physical Therapy

## 2021-12-07 DIAGNOSIS — M6281 Muscle weakness (generalized): Secondary | ICD-10-CM | POA: Diagnosis not present

## 2021-12-07 DIAGNOSIS — R2689 Other abnormalities of gait and mobility: Secondary | ICD-10-CM

## 2021-12-07 NOTE — Therapy (Signed)
OUTPATIENT PHYSICAL THERAPY LOWER EXTREMITY EVALUATION   Patient Name: Allen Robinson MRN: [id number] DOB:23-Mar-1926, 88 y.o., male Today's Date: 12/07/21   PT End of Session - 12/11/21 1557     Visit Number 1    Number of Visits 16    Authorization Type Medicare    PT Start Time 0845    PT Stop Time 0930    PT Time Calculation (min) 45 min    Equipment Utilized During Treatment Gait belt    Activity Tolerance Patient tolerated treatment well    Behavior During Therapy WFL for tasks assessed/performed             Past Medical History:  Diagnosis Date   Arthritis    hands   Chicken pox    Hyperlipidemia    Pneumonia    Seasonal allergies    Past Surgical History:  Procedure Laterality Date   TONSILLECTOMY     1936   Patient Active Problem List   Diagnosis Date Noted   Senile purpura ([place]) 05/03/2020   Aortic atherosclerosis ([place]) 05/21/2019   Right rib fracture 05/15/2019   Hyperglycemia 04/28/2019   Gout 03/14/2017   Vitamin D deficiency 03/14/2017   BPH associated with nocturia 03/14/2017   Hyperlipidemia 03/14/2017   GERD (gastroesophageal reflux disease) 03/14/2017   Peripheral polyneuropathy 02/16/2016   Diplopia 11/19/2015    PCP: Marin Olp, MD  REFERRING PROVIDER: Marin Olp, MD  REFERRING DIAG: Gait/balance   THERAPY DIAG:  Other abnormalities of gait and mobility  Muscle weakness (generalized)    SUBJECTIVE:   SUBJECTIVE STATEMENT: Pt reports falling about 1x/month. Recent fall, missed the curb and fell, states no injuries from falls. States most falls are in his yard, doing yard work. He has been able to get himself up. Has difficulty with stairs. Is using SPC. Was seen previously in PT about 1 year ago.   PERTINENT HISTORY: none    PAIN:  Are you having pain? No   PRECAUTIONS: Fall   WEIGHT BEARING RESTRICTIONS No  FALLS:  Has patient fallen in last 6 months? Yes, Number of falls: 6 Pt reluctant to  leave home: no,  Has had decreased activity: no    OCCUPATION: Retired   PLOF: Independent  PATIENT GOALS  Decreased falls, improved balance.    OBJECTIVE:   COGNITION:  Overall cognitive status: Within functional limits for tasks assessed     LE AROM/PROM:  LEs: WFL, mild limitation for DF bilaterally   LE MMT:  Hips: 4/5, Knees: 4+/5    GAIT: Using [facility]    [facility] PT Assessment - 12/11/21 0001       Standardized Balance Assessment   Standardized Balance Assessment Berg Balance Test;Dynamic Gait Index;Timed Up and Go Test      Berg Balance Test   Sit to Stand Able to stand without using hands and stabilize independently    Standing Unsupported Able to stand safely 2 minutes    Sitting with Back Unsupported but Feet Supported on Floor or Stool Able to sit safely and securely 2 minutes    Stand to Sit Sits safely with minimal use of hands    Transfers Able to transfer safely, minor use of hands    Standing Unsupported with Eyes Closed Able to stand 10 seconds safely    Standing Unsupported with Feet Together Able to place feet together independently and stand 1 minute safely    From Standing, Reach Forward with Outstretched Arm Can reach forward >12  cm safely (5")    From Standing Position, Pick up Object from [place] to pick up shoe safely and easily    From Standing Position, Turn to Look Behind Over each Shoulder Turn sideways only but maintains balance    Turn 360 Degrees Able to turn 360 degrees safely but slowly    Standing Unsupported, Alternately Place Feet on Step/Stool Able to complete >2 steps/needs minimal assist    Standing Unsupported, One Foot in Front Able to take small step independently and hold 30 seconds    Standing on One Leg Tries to lift leg/unable to hold 3 seconds but remains standing independently    Total Score 43      Timed Up and Go Test   Normal TUG (seconds) 14.33    TUG Comments with cane                TODAY'S  TREATMENT: Balance tests/Evaluation   PATIENT EDUCATION:  Education details: PT POC , Exam findings,  Person educated: Patient Education method: Explanation Education comprehension: verbalized understanding, returned demonstration, and needs further education   HOME EXERCISE PROGRAM: N/A  ASSESSMENT:  CLINICAL IMPRESSION: Pt presents with primary complaint of decreased balance and falls, about 1x/month. He has not had any injuries, and has been able to get himself up. He has decreased safety awareness and deficits with dynamic balance. He has decreased score on balance tests today, will test DGI next visit. Pt to benefit from skilled PT to improve dynamic balance, safety awareness and fall risk.   Objective impairments include Abnormal gait, decreased activity tolerance, decreased balance, decreased coordination, decreased knowledge of use of DME, decreased mobility, difficulty walking, decreased strength, impaired flexibility, and improper body mechanics. These impairments are limiting patient from cleaning, community activity, yard work, and shopping. Personal factors including Age are also affecting patient's functional outcome. Patient will benefit from skilled PT to address above impairments and improve overall function.  REHAB POTENTIAL: Fair   CLINICAL DECISION MAKING: Stable/uncomplicated  EVALUATION COMPLEXITY: Low   GOALS: Goals reviewed with patient? Yes  SHORT TERM GOALS:  STG Name Target Date Goal status  1 Patient will be independent with initial HEP   12/21/21 INITIAL  2 Pt to demo ability for independent floor transfers 12/28/21 INITIAL         LONG TERM GOALS:   LTG Name Target Date Goal status  1 Patient will be independent with final HEP  02/01/22 INITIAL  2 Pt to demo improved score on  BERG, by at least 8 points   02/01/22 INITIAL  3 Pt to demonstrate improved score on DGI  (TBD)   02/01/22 INITIAL       4 Pt to demo ability for stair navigation,  reciprocal pattern, with 1 UE support, and not pain, to improve ability for home and community navigation.   02/01/22 INITIAL      PLAN: PT FREQUENCY: 2x/week  PT DURATION: 8 weeks  PLANNED INTERVENTIONS: Therapeutic exercises, Therapeutic activity, Neuro Muscular re-education, Balance training, Gait training, Patient/Family education, Joint mobilization, Stair training, DME instructions, Dry Needling, Electrical stimulation, Spinal mobilization, Cryotherapy, Moist heat, Taping, Vasopneumatic device, Traction, Ionotophoresis 4mg /ml Dexamethasone, and Manual therapy  PLAN FOR NEXT SESSION: Test DGI, initial balance exercises.    Lyndee Hensen, PT, DPT 4:10 PM  12/11/21

## 2021-12-11 ENCOUNTER — Encounter: Payer: Self-pay | Admitting: Physical Therapy

## 2021-12-21 ENCOUNTER — Other Ambulatory Visit: Payer: Self-pay

## 2021-12-21 ENCOUNTER — Ambulatory Visit (INDEPENDENT_AMBULATORY_CARE_PROVIDER_SITE_OTHER): Payer: Medicare Other | Admitting: Physical Therapy

## 2021-12-21 ENCOUNTER — Encounter: Payer: Self-pay | Admitting: Physical Therapy

## 2021-12-21 DIAGNOSIS — R2689 Other abnormalities of gait and mobility: Secondary | ICD-10-CM | POA: Diagnosis not present

## 2021-12-21 DIAGNOSIS — M6281 Muscle weakness (generalized): Secondary | ICD-10-CM | POA: Diagnosis not present

## 2021-12-21 NOTE — Therapy (Signed)
OUTPATIENT PHYSICAL THERAPY TREATMENT   Patient Name: Allen Robinson MRN: [id number] DOB:Oct 07, 1926, 88 y.o., male Today's Date: 12/21/2021    PT End of Session - 12/21/21 1210     Visit Number 2    Number of Visits 16    Authorization Type Medicare    PT Start Time 1005    PT Stop Time 1048    PT Time Calculation (min) 43 min    Equipment Utilized During Treatment Gait belt    Activity Tolerance Patient tolerated treatment well    Behavior During Therapy WFL for tasks assessed/performed              Past Medical History:  Diagnosis Date   Arthritis    hands   Chicken pox    Hyperlipidemia    Pneumonia    Seasonal allergies    Past Surgical History:  Procedure Laterality Date   TONSILLECTOMY     1936   Patient Active Problem List   Diagnosis Date Noted   Senile purpura ([place]) 05/03/2020   Aortic atherosclerosis ([place]) 05/21/2019   Right rib fracture 05/15/2019   Hyperglycemia 04/28/2019   Gout 03/14/2017   Vitamin D deficiency 03/14/2017   BPH associated with nocturia 03/14/2017   Hyperlipidemia 03/14/2017   GERD (gastroesophageal reflux disease) 03/14/2017   Peripheral polyneuropathy 02/16/2016   Diplopia 11/19/2015    PCP: Marin Olp, MD  REFERRING PROVIDER: Marin Olp, MD  REFERRING DIAG: Gait/balance   THERAPY DIAG:  Other abnormalities of gait and mobility  Muscle weakness (generalized)    SUBJECTIVE:   SUBJECTIVE STATEMENT: Pt reports another fall this week, stepping over vacuum tubes at home. No injury. He has concerns as to "what is wrong with him, that his balance is declining"   PERTINENT HISTORY: none    PAIN:  Are you having pain? No   PRECAUTIONS: Fall   WEIGHT BEARING RESTRICTIONS No  FALLS:  Has patient fallen in last 6 months? Yes, Number of falls: 6 Pt reluctant to leave home: no,  Has had decreased activity: no    OCCUPATION: Retired   PLOF: Independent  PATIENT GOALS  Decreased falls,  improved balance.    OBJECTIVE:   COGNITION:  Overall cognitive status: Within functional limits for tasks assessed     LE AROM/PROM:  LEs: WFL, mild limitation for DF bilaterally   LE MMT:  Hips: 4/5, Knees: 4+/5    GAIT: Using [facility]   [facility] PT Assessment - 12/21/21 0001       Dynamic Gait Index   Level Surface Mild Impairment    Change in Gait Speed Moderate Impairment    Gait with Horizontal Head Turns Mild Impairment    Gait with Vertical Head Turns Mild Impairment    Gait and Pivot Turn Moderate Impairment    Step Over Obstacle Moderate Impairment    Step Around Obstacles Mild Impairment    Steps Moderate Impairment    Total Score 12                TODAY'S TREATMENT: 12/21/21:  Therapeutic Exercise: Aerobic: Supine:  Seated:  sit to stand x 10 , sit to stand with quick initial step fwd x 5 bil; LAQ 3lb x 20 bil;  Standing: Heel raises at counter x 15; Marching x 20;  Stretches:  Neuromuscular Re-education: L/R weight shifts x 20 at counter, no UE support;  Fwd/bwd stepping with weight shifts x 20 bil/large steps; Walking around cones 20 ft x 4;  Stepping up/over cones 20 ft x 4; Toe taps in 6 in step x 20;   Manual Therapy: Therapeutic Activity: Self Care: Discussed home safety and heavier home activities/cleaning to avoid if not safe, ie: stepping over vacuum tubes last week.     PATIENT EDUCATION:  Education details: PT POC , Exam findings,  Person educated: Patient Education method: Explanation Education comprehension: verbalized understanding, returned demonstration, and needs further education   HOME EXERCISE PROGRAM: N/A  ASSESSMENT:  CLINICAL IMPRESSION: 12/21/21: Pt did well with initial balance exercises today, reviewed HEP that pt had previously, updated today. Discussed home safety and heavier activities to try to avoid. Pt is on waiting list for assisted/independent living at facility, hopefully this will decrease his cleaning and IADL  duties, as this seems to be most of the falls that he has had. Pt challenged with balance activities today, will benefit from continued work on dynamic balance for increased safety.   Objective impairments include Abnormal gait, decreased activity tolerance, decreased balance, decreased coordination, decreased knowledge of use of DME, decreased mobility, difficulty walking, decreased strength, impaired flexibility, and improper body mechanics. These impairments are limiting patient from cleaning, community activity, yard work, and shopping. Personal factors including Age are also affecting patient's functional outcome. Patient will benefit from skilled PT to address above impairments and improve overall function.  REHAB POTENTIAL: Fair   CLINICAL DECISION MAKING: Stable/uncomplicated  EVALUATION COMPLEXITY: Low   GOALS: Goals reviewed with patient? Yes  SHORT TERM GOALS:  STG Name Target Date Goal status  1 Patient will be independent with initial HEP   12/21/21 INITIAL  2 Pt to demo ability for independent floor transfers 12/28/21 INITIAL         LONG TERM GOALS:   LTG Name Target Date Goal status  1 Patient will be independent with final HEP  02/01/22 INITIAL  2 Pt to demo improved score on  BERG, by at least 8 points   02/01/22 INITIAL  3 Pt to demonstrate improved score on DGI  (TBD)   02/01/22 INITIAL       4 Pt to demo ability for stair navigation, reciprocal pattern, with 1 UE support, and not pain, to improve ability for home and community navigation.   02/01/22 INITIAL      PLAN: PT FREQUENCY: 2x/week  PT DURATION: 8 weeks  PLANNED INTERVENTIONS: Therapeutic exercises, Therapeutic activity, Neuro Muscular re-education, Balance training, Gait training, Patient/Family education, Joint mobilization, Stair training, DME instructions, Dry Needling, Electrical stimulation, Spinal mobilization, Cryotherapy, Moist heat, Taping, Vasopneumatic device, Traction, Ionotophoresis 4mg /ml  Dexamethasone, and Manual therapy  PLAN FOR NEXT SESSION: Test DGI, initial balance exercises.    Lyndee Hensen, PT, DPT 1:24 PM  12/21/21

## 2021-12-23 ENCOUNTER — Ambulatory Visit (INDEPENDENT_AMBULATORY_CARE_PROVIDER_SITE_OTHER): Payer: Medicare Other | Admitting: Physical Therapy

## 2021-12-23 ENCOUNTER — Encounter: Payer: Self-pay | Admitting: Physical Therapy

## 2021-12-23 DIAGNOSIS — M6281 Muscle weakness (generalized): Secondary | ICD-10-CM

## 2021-12-23 DIAGNOSIS — R2689 Other abnormalities of gait and mobility: Secondary | ICD-10-CM | POA: Diagnosis not present

## 2021-12-23 NOTE — Therapy (Signed)
OUTPATIENT PHYSICAL THERAPY TREATMENT   Patient Name: Allen Robinson MRN: [id number] DOB:07/18/1926, 88 y.o., male Today's Date: 12/23/2021    PT End of Session - 12/23/21 1250     Visit Number 3    Number of Visits 16    Authorization Type Medicare    PT Start Time 1016    PT Stop Time 1105    PT Time Calculation (min) 49 min    Equipment Utilized During Treatment Gait belt    Activity Tolerance Patient tolerated treatment well    Behavior During Therapy WFL for tasks assessed/performed               Past Medical History:  Diagnosis Date   Arthritis    hands   Chicken pox    Hyperlipidemia    Pneumonia    Seasonal allergies    Past Surgical History:  Procedure Laterality Date   TONSILLECTOMY     1936   Patient Active Problem List   Diagnosis Date Noted   Senile purpura ([place]) 05/03/2020   Aortic atherosclerosis ([place]) 05/21/2019   Right rib fracture 05/15/2019   Hyperglycemia 04/28/2019   Gout 03/14/2017   Vitamin D deficiency 03/14/2017   BPH associated with nocturia 03/14/2017   Hyperlipidemia 03/14/2017   GERD (gastroesophageal reflux disease) 03/14/2017   Peripheral polyneuropathy 02/16/2016   Diplopia 11/19/2015    PCP: Marin Olp, MD  REFERRING PROVIDER: Marin Olp, MD  REFERRING DIAG: Gait/balance   THERAPY DIAG:  Other abnormalities of gait and mobility  Muscle weakness (generalized)    SUBJECTIVE:   SUBJECTIVE STATEMENT: Pt with no new complaints.   PERTINENT HISTORY: none    PAIN:  Are you having pain? No   PRECAUTIONS: Fall   WEIGHT BEARING RESTRICTIONS No  FALLS:  Has patient fallen in last 6 months? Yes, Number of falls: 6 Pt reluctant to leave home: no,  Has had decreased activity: no    OCCUPATION: Retired   PLOF: Independent  PATIENT GOALS  Decreased falls, improved balance.    OBJECTIVE:   COGNITION:  Overall cognitive status: Within functional limits for tasks assessed     LE  AROM/PROM:  LEs: WFL, mild limitation for DF bilaterally   LE MMT:  Hips: 4/5, Knees: 4+/5    GAIT: Using SPC      TODAY'S TREATMENT:  12/23/21: Therapeutic Exercise: Aerobic: Supine:  Seated:  sit to stand x 10 ,  Standing: Heel raises at counter x 15; Marching x 20;  Stretches: Gastroc stretch at counter 30 sec x 3 bil;   Neuromuscular Re-education: AirEx: L/R and A/P weight shifts x 25 ea no UE support;  Fwd/bwd/diagonal stepping with weight shifts x 20 bil/large steps;  Toe taps in 6 in step x 20; bwd walking 25 ft x 6; Obstacle course stepping up onto AirEx, and up/over noodles x 4 min;  Practice for navigation of 1 step, with cane, education for safety and sequencing when entering/exiting his house x 10 min Self Care:    12/21/21:  Therapeutic Exercise: Aerobic: Supine:  Seated:  sit to stand x 10 , sit to stand with quick initial step fwd x 5 bil; LAQ 3lb x 20 bil;  Standing: Heel raises at counter x 15; Marching x 20;  Stretches:  Neuromuscular Re-education: L/R weight shifts x 20 at counter, no UE support;  Fwd/bwd stepping with weight shifts x 20 bil/large steps; Walking around cones 20 ft x 4;  Stepping up/over cones 20 ft x  4; Toe taps in 6 in step x 20;   Manual Therapy: Therapeutic Activity: Self Care: Discussed home safety and heavier home activities/cleaning to avoid if not safe, ie: stepping over vacuum tubes last week.     PATIENT EDUCATION:  Education details: PT POC , Exam findings,  Person educated: Patient Education method: Explanation Education comprehension: verbalized understanding, returned demonstration, and needs further education   HOME EXERCISE PROGRAM: [id number]   ASSESSMENT:  CLINICAL IMPRESSION: 12/23/21: Pt with noted instability in L LE vs R with stepping and weight shifts onto L leg. More susceptible to lob with L foot stepping with stairs and obstacle course. Pt challenged with activities today. Will benefit from continued  practice for stairs and safe exiting of his house.   Objective impairments include Abnormal gait, decreased activity tolerance, decreased balance, decreased coordination, decreased knowledge of use of DME, decreased mobility, difficulty walking, decreased strength, impaired flexibility, and improper body mechanics. These impairments are limiting patient from cleaning, community activity, yard work, and shopping. Personal factors including Age are also affecting patient's functional outcome. Patient will benefit from skilled PT to address above impairments and improve overall function.  REHAB POTENTIAL: Fair   CLINICAL DECISION MAKING: Stable/uncomplicated  EVALUATION COMPLEXITY: Low   GOALS: Goals reviewed with patient? Yes  SHORT TERM GOALS:  STG Name Target Date Goal status  1 Patient will be independent with initial HEP   12/21/21 INITIAL  2 Pt to demo ability for independent floor transfers 12/28/21 INITIAL         LONG TERM GOALS:   LTG Name Target Date Goal status  1 Patient will be independent with final HEP  02/01/22 INITIAL  2 Pt to demo improved score on  BERG, by at least 8 points   02/01/22 INITIAL  3 Pt to demonstrate improved score on DGI  (TBD)   02/01/22 INITIAL       4 Pt to demo ability for stair navigation, reciprocal pattern, with 1 UE support, and not pain, to improve ability for home and community navigation.   02/01/22 INITIAL      PLAN: PT FREQUENCY: 2x/week  PT DURATION: 8 weeks  PLANNED INTERVENTIONS: Therapeutic exercises, Therapeutic activity, Neuro Muscular re-education, Balance training, Gait training, Patient/Family education, Joint mobilization, Stair training, DME instructions, Dry Needling, Electrical stimulation, Spinal mobilization, Cryotherapy, Moist heat, Taping, Vasopneumatic device, Traction, Ionotophoresis 4mg /ml Dexamethasone, and Manual therapy  PLAN FOR NEXT SESSION: Test DGI, initial balance exercises.    Lyndee Hensen, PT,  DPT 12:51 PM  12/23/21

## 2021-12-27 ENCOUNTER — Other Ambulatory Visit: Payer: Self-pay

## 2021-12-27 ENCOUNTER — Ambulatory Visit (INDEPENDENT_AMBULATORY_CARE_PROVIDER_SITE_OTHER): Payer: Medicare Other | Admitting: Physical Therapy

## 2021-12-27 DIAGNOSIS — R2689 Other abnormalities of gait and mobility: Secondary | ICD-10-CM

## 2021-12-27 DIAGNOSIS — M6281 Muscle weakness (generalized): Secondary | ICD-10-CM | POA: Diagnosis not present

## 2021-12-27 NOTE — Therapy (Signed)
OUTPATIENT PHYSICAL THERAPY TREATMENT   Patient Name: Allen Robinson MRN: [id number] DOB:September 16, 1926, 88 y.o., male Today's Date: 12/28/2021    PT End of Session - 12/28/21 0919     Visit Number 4    Number of Visits 16    Authorization Type Medicare    PT Start Time 1016    PT Stop Time 1100    PT Time Calculation (min) 44 min    Equipment Utilized During Treatment Gait belt    Activity Tolerance Patient tolerated treatment well    Behavior During Therapy WFL for tasks assessed/performed                Past Medical History:  Diagnosis Date   Arthritis    hands   Chicken pox    Hyperlipidemia    Pneumonia    Seasonal allergies    Past Surgical History:  Procedure Laterality Date   TONSILLECTOMY     1936   Patient Active Problem List   Diagnosis Date Noted   Senile purpura ([place]) 05/03/2020   Aortic atherosclerosis ([place]) 05/21/2019   Right rib fracture 05/15/2019   Hyperglycemia 04/28/2019   Gout 03/14/2017   Vitamin D deficiency 03/14/2017   BPH associated with nocturia 03/14/2017   Hyperlipidemia 03/14/2017   GERD (gastroesophageal reflux disease) 03/14/2017   Peripheral polyneuropathy 02/16/2016   Diplopia 11/19/2015    PCP: Marin Olp, MD  REFERRING PROVIDER: Marin Olp, MD  REFERRING DIAG: Gait/balance   THERAPY DIAG:  Other abnormalities of gait and mobility  Muscle weakness (generalized)    SUBJECTIVE:   SUBJECTIVE STATEMENT: Pt with no new complaints.   PERTINENT HISTORY: none    PAIN:  Are you having pain? No   PRECAUTIONS: Fall   WEIGHT BEARING RESTRICTIONS No  FALLS:  Has patient fallen in last 6 months? Yes, Number of falls: 6 Pt reluctant to leave home: no,  Has had decreased activity: no    OCCUPATION: Retired   PLOF: Independent  PATIENT GOALS  Decreased falls, improved balance.    OBJECTIVE:   COGNITION:  Overall cognitive status: Within functional limits for tasks assessed     LE  AROM/PROM:  LEs: WFL, mild limitation for DF bilaterally   LE MMT:  Hips: 4/5, Knees: 4+/5    GAIT: Using SPC     TODAY'S TREATMENT: 12/27/21: Therapeutic Exercise: Aerobic: Supine:  Seated:  sit to stand x 15 , Ankle and toe ROM and education on self ROM for toes x 5 min Standing:  Marching x 20;  Stretches: Gastroc stretch at counter 60 sec x 1 bil;   Neuromuscular Re-education:  A/P weight shifts x 25 ea no UE support;  Fwd/bwd stepping with weight shifts x 20 ;  Toe taps in 6 in step x 20; Step ups 6 in x 10 bil, 1 UE support;  Practice for navigation of 1 step education for safety and hand placement when entering/exiting his house x10 Self Care:    12/23/21: Therapeutic Exercise: Aerobic: Supine:  Seated:  sit to stand x 10 ,  Standing: Heel raises at counter x 15; Marching x 20;  Stretches: Gastroc stretch at counter 30 sec x 3 bil;   Neuromuscular Re-education:  Fwd/bwd stepping  with weight shifts x 20 bil/large steps;  Toe taps in 6 in step x 20; Step ups 6 in, 1 UE on rail, x 15 bil;    Practice for navigation of 1 step, with cane, education for safety and sequencing when  entering/exiting his house x 10 min Self Care: Education and practice for self ROM for ankles and toes, x8  min;    12/21/21: Therapeutic Exercise: Aerobic: Supine:  Seated:  sit to stand x 10 , sit to stand with quick initial step fwd x 5 bil; LAQ 3lb x 20 bil;  Standing: Heel raises at counter x 15; Marching x 20;  Stretches:  Neuromuscular Re-education: L/R weight shifts x 20 at counter, no UE support;  Fwd/bwd stepping with weight shifts x 20 bil/large steps; Walking around cones 20 ft x 4;  Stepping up/over cones 20 ft x 4; Toe taps in 6 in step x 20;   Manual Therapy: Therapeutic Activity: Self Care: Discussed home safety and heavier home activities/cleaning to avoid if not safe, ie: stepping over vacuum tubes last week.     PATIENT EDUCATION:  Education details: PT POC , Exam  findings,  Person educated: Patient Education method: Explanation Education comprehension: verbalized understanding, returned demonstration, and needs further education   HOME EXERCISE PROGRAM: [id number]   ASSESSMENT:  CLINICAL IMPRESSION: 12/27/21: Pt continues to be challenged with dynamic balance. He is doing well on stairs, but does have times that he catches toe and has decreased safety. Education on foot and ankle mobility today, pt does have stiffness in ankles and toes that may be contributing to balance. Education and practice for exiting house safely and optimal hand placement with door. Pt to benefit from continued care.    Objective impairments include Abnormal gait, decreased activity tolerance, decreased balance, decreased coordination, decreased knowledge of use of DME, decreased mobility, difficulty walking, decreased strength, impaired flexibility, and improper body mechanics. These impairments are limiting patient from cleaning, community activity, yard work, and shopping. Personal factors including Age are also affecting patient's functional outcome. Patient will benefit from skilled PT to address above impairments and improve overall function.  REHAB POTENTIAL: Fair   CLINICAL DECISION MAKING: Stable/uncomplicated  EVALUATION COMPLEXITY: Low   GOALS: Goals reviewed with patient? Yes  SHORT TERM GOALS:  STG Name Target Date Goal status  1 Patient will be independent with initial HEP   12/21/21 INITIAL  2 Pt to demo ability for independent floor transfers 12/28/21 INITIAL         LONG TERM GOALS:   LTG Name Target Date Goal status  1 Patient will be independent with final HEP  02/01/22 INITIAL  2 Pt to demo improved score on  BERG, by at least 8 points   02/01/22 INITIAL  3 Pt to demonstrate improved score on DGI  (TBD)   02/01/22 INITIAL       4 Pt to demo ability for stair navigation, reciprocal pattern, with 1 UE support, and not pain, to improve ability for  home and community navigation.   02/01/22 INITIAL      PLAN: PT FREQUENCY: 2x/week  PT DURATION: 8 weeks  PLANNED INTERVENTIONS: Therapeutic exercises, Therapeutic activity, Neuro Muscular re-education, Balance training, Gait training, Patient/Family education, Joint mobilization, Stair training, DME instructions, Dry Needling, Electrical stimulation, Spinal mobilization, Cryotherapy, Moist heat, Taping, Vasopneumatic device, Traction, Ionotophoresis 4mg /ml Dexamethasone, and Manual therapy  PLAN FOR NEXT SESSION: Test DGI, initial balance exercises.    Lyndee Hensen, PT, DPT 9:19 AM  12/28/21

## 2021-12-28 ENCOUNTER — Encounter: Payer: Self-pay | Admitting: Physical Therapy

## 2021-12-28 DIAGNOSIS — Z85828 Personal history of other malignant neoplasm of skin: Secondary | ICD-10-CM | POA: Diagnosis not present

## 2021-12-28 DIAGNOSIS — L720 Epidermal cyst: Secondary | ICD-10-CM | POA: Diagnosis not present

## 2021-12-29 ENCOUNTER — Ambulatory Visit (INDEPENDENT_AMBULATORY_CARE_PROVIDER_SITE_OTHER): Payer: Medicare Other | Admitting: Physical Therapy

## 2021-12-29 ENCOUNTER — Encounter: Payer: Self-pay | Admitting: Physical Therapy

## 2021-12-29 ENCOUNTER — Other Ambulatory Visit: Payer: Self-pay

## 2021-12-29 DIAGNOSIS — R2689 Other abnormalities of gait and mobility: Secondary | ICD-10-CM | POA: Diagnosis not present

## 2021-12-29 DIAGNOSIS — M6281 Muscle weakness (generalized): Secondary | ICD-10-CM | POA: Diagnosis not present

## 2021-12-29 NOTE — Therapy (Signed)
OUTPATIENT PHYSICAL THERAPY TREATMENT   Patient Name: Allen Robinson MRN: [id number] DOB:05/16/1926, 88 y.o., male Today's Date: 12/29/2021    PT End of Session - 12/29/21 1620     Visit Number 5    Number of Visits 16    Authorization Type Medicare    PT Start Time 0930    PT Stop Time 4270    PT Time Calculation (min) 45 min    Equipment Utilized During Treatment Gait belt    Activity Tolerance Patient tolerated treatment well    Behavior During Therapy WFL for tasks assessed/performed                Past Medical History:  Diagnosis Date   Arthritis    hands   Chicken pox    Hyperlipidemia    Pneumonia    Seasonal allergies    Past Surgical History:  Procedure Laterality Date   TONSILLECTOMY     1936   Patient Active Problem List   Diagnosis Date Noted   Senile purpura ([place]) 05/03/2020   Aortic atherosclerosis ([place]) 05/21/2019   Right rib fracture 05/15/2019   Hyperglycemia 04/28/2019   Gout 03/14/2017   Vitamin D deficiency 03/14/2017   BPH associated with nocturia 03/14/2017   Hyperlipidemia 03/14/2017   GERD (gastroesophageal reflux disease) 03/14/2017   Peripheral polyneuropathy 02/16/2016   Diplopia 11/19/2015    PCP: Marin Olp, MD  REFERRING PROVIDER: Marin Olp, MD  REFERRING DIAG: Gait/balance   THERAPY DIAG:  Other abnormalities of gait and mobility  Muscle weakness (generalized)    SUBJECTIVE:   SUBJECTIVE STATEMENT: Pt reports fall on Tuesday afternoon, was taking a box into [employer], was holding box with 2 hands, walking up step, without using SPC. He fell backwards. He did hit his head, has large scrape on head. States no pain, injury, dizziness, confusion, vision changes.  Wound on top/posterior head from fall: about 4 cmx 4 cm skin tear, he has it covered with non adherent dressing.   PERTINENT HISTORY: none   PAIN:  Are you having pain? No   PRECAUTIONS: Fall   WEIGHT BEARING RESTRICTIONS  No  FALLS:  Has patient fallen in last 6 months? Yes, Number of falls: 6 Pt reluctant to leave home: no,  Has had decreased activity: no   OCCUPATION: Retired   PLOF: Independent  PATIENT GOALS  Decreased falls, improved balance.    OBJECTIVE:   COGNITION:  Overall cognitive status: Within functional limits for tasks assessed     LE AROM/PROM:  LEs: WFL, mild limitation for DF bilaterally   LE MMT:  Hips: 4/5, Knees: 4+/5   GAIT: Using SPC    TODAY'S TREATMENT: 12/29/21: Therapeutic Exercise: Aerobic: Supine:  Seated:   Standing:  Marching x 20 Stretches:  Neuromuscular Re-education:   Toe taps in 6 in step x 20; Step ups 6 in x 10 bil, 1 UE support; cross body press GTB x 10 bil;  Staggered stance (1 foot on 6 in step) standing x 1 min, then A/P weight shifts x 10 bil;  Education and practice outdoors for curb navigation using [facility];  Self Care: Education on safety with mobility, recommended pt not try to do stairs, curbs, or walking, without use of at least [facility]. Discussed multiple falls and taking extra time to plan movements.     12/27/21: Therapeutic Exercise: Aerobic: Supine:  Seated:  sit to stand x 15 , Ankle and toe ROM and education on self ROM for toes x  5 min Standing:  Marching x 20;  Stretches: Gastroc stretch at counter 60 sec x 1 bil;   Neuromuscular Re-education:  A/P weight shifts x 25 ea no UE support;  Fwd/bwd stepping with weight shifts x 20 ;  Toe taps in 6 in step x 20; Step ups 6 in x 10 bil, 1 UE support;  Practice for navigation of 1 step education for safety and hand placement when entering/exiting his house x10 Self Care:    12/23/21: Therapeutic Exercise: Aerobic: Supine:  Seated:  sit to stand x 10 ,  Standing: Heel raises at counter x 15; Marching x 20;  Stretches: Gastroc stretch at counter 30 sec x 3 bil;   Neuromuscular Re-education:  Fwd/bwd stepping  with weight shifts x 20 bil/large steps;  Toe taps in 6 in step x 20; Step  ups 6 in, 1 UE on rail, x 15 bil;    Practice for navigation of 1 step, with cane, education for safety and sequencing when entering/exiting his house x 10 min Self Care: Education and practice for self ROM for ankles and toes, x8  min;    PATIENT EDUCATION:  Education details: Discussed home safety, safety with community navigation, need to use AD.   Person educated: Patient Education method: Explanation Education comprehension: verbalized understanding, returned demonstration, and needs further education   HOME EXERCISE PROGRAM: [id number]   ASSESSMENT:  CLINICAL IMPRESSION: 12/29/21: Pt doing well during sessions, but is continuing to have falls. He was not using cane this week when he fell. Discussed safety with mobility at length today and need to have AD with him at all times. Recommended that he use RW out in community to prevent further falls. Education and practice today for outdoor curb navigation, pt definitely requires use of SPC for safety, and would not be able to perform without AD. Pt to benefit from continued care. Discussed need to have head looked at if skin tear does not improve.   Objective impairments include Abnormal gait, decreased activity tolerance, decreased balance, decreased coordination, decreased knowledge of use of DME, decreased mobility, difficulty walking, decreased strength, impaired flexibility, and improper body mechanics. These impairments are limiting patient from cleaning, community activity, yard work, and shopping. Personal factors including Age are also affecting patient's functional outcome. Patient will benefit from skilled PT to address above impairments and improve overall function.  REHAB POTENTIAL: Fair   CLINICAL DECISION MAKING: Stable/uncomplicated  EVALUATION COMPLEXITY: Low   GOALS: Goals reviewed with patient? Yes  SHORT TERM GOALS:  STG Name Target Date Goal status  1 Patient will be independent with initial HEP   12/21/21  INITIAL  2 Pt to demo ability for independent floor transfers 12/28/21 INITIAL         LONG TERM GOALS:   LTG Name Target Date Goal status  1 Patient will be independent with final HEP  02/01/22 INITIAL  2 Pt to demo improved score on  BERG, by at least 8 points   02/01/22 INITIAL  3 Pt to demonstrate improved score on DGI  (TBD)   02/01/22 INITIAL       4 Pt to demo ability for stair navigation, reciprocal pattern, with 1 UE support, and not pain, to improve ability for home and community navigation.   02/01/22 INITIAL      PLAN: PT FREQUENCY: 2x/week  PT DURATION: 8 weeks  PLANNED INTERVENTIONS: Therapeutic exercises, Therapeutic activity, Neuro Muscular re-education, Balance training, Gait training, Patient/Family education, Joint mobilization, Stair training, DME  instructions, Dry Needling, Electrical stimulation, Spinal mobilization, Cryotherapy, Moist heat, Taping, Vasopneumatic device, Traction, Ionotophoresis 4mg /ml Dexamethasone, and Manual therapy  PLAN FOR NEXT SESSION:    Lyndee Hensen, PT, DPT 4:22 PM  12/29/21

## 2022-01-03 ENCOUNTER — Other Ambulatory Visit: Payer: Self-pay

## 2022-01-03 ENCOUNTER — Encounter: Payer: Self-pay | Admitting: Physical Therapy

## 2022-01-03 ENCOUNTER — Ambulatory Visit (INDEPENDENT_AMBULATORY_CARE_PROVIDER_SITE_OTHER): Payer: Medicare Other | Admitting: Physical Therapy

## 2022-01-03 DIAGNOSIS — R2689 Other abnormalities of gait and mobility: Secondary | ICD-10-CM | POA: Diagnosis not present

## 2022-01-03 DIAGNOSIS — M6281 Muscle weakness (generalized): Secondary | ICD-10-CM

## 2022-01-03 NOTE — Therapy (Signed)
OUTPATIENT PHYSICAL THERAPY TREATMENT   Patient Name: Allen Robinson MRN: [id number] DOB:1926/10/23, 88 y.o., male Today's Date: 01/03/2022    PT End of Session - 01/03/22 1355     Visit Number 6    Number of Visits 16    Authorization Type Medicare    PT Start Time 84    PT Stop Time 1400    PT Time Calculation (min) 42 min    Equipment Utilized During Treatment Gait belt    Activity Tolerance Patient tolerated treatment well    Behavior During Therapy WFL for tasks assessed/performed                 Past Medical History:  Diagnosis Date   Arthritis    hands   Chicken pox    Hyperlipidemia    Pneumonia    Seasonal allergies    Past Surgical History:  Procedure Laterality Date   TONSILLECTOMY     1936   Patient Active Problem List   Diagnosis Date Noted   Senile purpura ([place]) 05/03/2020   Aortic atherosclerosis ([place]) 05/21/2019   Right rib fracture 05/15/2019   Hyperglycemia 04/28/2019   Gout 03/14/2017   Vitamin D deficiency 03/14/2017   BPH associated with nocturia 03/14/2017   Hyperlipidemia 03/14/2017   GERD (gastroesophageal reflux disease) 03/14/2017   Peripheral polyneuropathy 02/16/2016   Diplopia 11/19/2015    PCP: Marin Olp, MD  REFERRING PROVIDER: Marin Olp, MD  REFERRING DIAG: Gait/balance   THERAPY DIAG:  Other abnormalities of gait and mobility  Muscle weakness (generalized)    SUBJECTIVE:   SUBJECTIVE STATEMENT: No new complaints today .   PERTINENT HISTORY: none   PAIN:  Are you having pain? No   PRECAUTIONS: Fall   WEIGHT BEARING RESTRICTIONS No  FALLS:  Has patient fallen in last 6 months? Yes, Number of falls: 6 Pt reluctant to leave home: no,  Has had decreased activity: no   OCCUPATION: Retired   PLOF: Independent  PATIENT GOALS  Decreased falls, improved balance.    OBJECTIVE:   COGNITION:  Overall cognitive status: Within functional limits for tasks assessed     LE  AROM/PROM:  LEs: WFL, mild limitation for DF bilaterally   LE MMT:  Hips: 4/5, Knees: 4+/5   GAIT: Using SPC    TODAY'S TREATMENT: 01/03/22: Therapeutic Exercise: Aerobic: Supine:  Seated:  sit to stand x 10 regular chair, no UE support  Standing:   Stretches:  Neuromuscular Re-education:   Toe taps in 6 in step x 20;  Staggered stance (1 foot on 6 in step) standing x 1 min; Stairs up/down 5 steps with SPC, no hand rails, x 6; Quick walking 35 ft x 6;  Bwd walking 25 ft x 6;  Squat to pick up cones 25 ft x 4;  Large steps with weight shifts and reach x 10 ea- diagonals and lateral;  Floor transfers x 2;  Self Care:    12/29/21: Therapeutic Exercise: Aerobic: Supine:  Seated:   Standing:  Marching x 20 Stretches:  Neuromuscular Re-education:   Toe taps in 6 in step x 20; Step ups 6 in x 10 bil, 1 UE support; cross body press GTB x 10 bil;  Staggered stance (1 foot on 6 in step) standing x 1 min, then A/P weight shifts x 10 bil;  Education and practice outdoors for curb navigation using [facility];  Self Care: Education on safety with mobility, recommended pt not try to do stairs, curbs, or  walking, without use of at least [facility]. Discussed multiple falls and taking extra time to plan movements.     12/27/21: Therapeutic Exercise: Aerobic: Supine:  Seated:  sit to stand x 15 , Ankle and toe ROM and education on self ROM for toes x 5 min Standing:  Marching x 20;  Stretches: Gastroc stretch at counter 60 sec x 1 bil;   Neuromuscular Re-education:  A/P weight shifts x 25 ea no UE support;  Fwd/bwd stepping with weight shifts x 20 ;  Toe taps in 6 in step x 20; Step ups 6 in x 10 bil, 1 UE support;  Practice for navigation of 1 step education for safety and hand placement when entering/exiting his house x10 Self Care:    PATIENT EDUCATION:  Education details: Discussed home safety, safety with community navigation, need to use AD.   Person educated: Patient Education method:  Explanation Education comprehension: verbalized understanding, returned demonstration, and needs further education   HOME EXERCISE PROGRAM: [id number]   ASSESSMENT:  CLINICAL IMPRESSION: 01/03/22:  Pt with decreased stability and confidence with step downs of stair, when using only SPC. He has some fear of falling with this activity. Pt cued for increased step height with weight shifts and with bwd walking today. Continued education for safety in community with need for use of SPC.    Objective impairments include Abnormal gait, decreased activity tolerance, decreased balance, decreased coordination, decreased knowledge of use of DME, decreased mobility, difficulty walking, decreased strength, impaired flexibility, and improper body mechanics. These impairments are limiting patient from cleaning, community activity, yard work, and shopping. Personal factors including Age are also affecting patient's functional outcome. Patient will benefit from skilled PT to address above impairments and improve overall function.  REHAB POTENTIAL: Fair   CLINICAL DECISION MAKING: Stable/uncomplicated  EVALUATION COMPLEXITY: Low   GOALS: Goals reviewed with patient? Yes  SHORT TERM GOALS:  STG Name Target Date Goal status  1 Patient will be independent with initial HEP   12/21/21 INITIAL  2 Pt to demo ability for independent floor transfers 12/28/21 INITIAL         LONG TERM GOALS:   LTG Name Target Date Goal status  1 Patient will be independent with final HEP  02/01/22 INITIAL  2 Pt to demo improved score on  BERG, by at least 8 points   02/01/22 INITIAL  3 Pt to demonstrate improved score on DGI  (TBD)   02/01/22 INITIAL       4 Pt to demo ability for stair navigation, reciprocal pattern, with 1 UE support, and not pain, to improve ability for home and community navigation.   02/01/22 INITIAL      PLAN: PT FREQUENCY: 2x/week  PT DURATION: 8 weeks  PLANNED INTERVENTIONS: Therapeutic  exercises, Therapeutic activity, Neuro Muscular re-education, Balance training, Gait training, Patient/Family education, Joint mobilization, Stair training, DME instructions, Dry Needling, Electrical stimulation, Spinal mobilization, Cryotherapy, Moist heat, Taping, Vasopneumatic device, Traction, Ionotophoresis 4mg /ml Dexamethasone, and Manual therapy  PLAN FOR NEXT SESSION:    Lyndee Hensen, PT, DPT 2:05 PM  01/03/22

## 2022-01-05 ENCOUNTER — Encounter: Payer: Self-pay | Admitting: Physical Therapy

## 2022-01-05 ENCOUNTER — Encounter: Payer: Medicare Other | Admitting: Physical Therapy

## 2022-01-05 ENCOUNTER — Other Ambulatory Visit: Payer: Self-pay

## 2022-01-05 ENCOUNTER — Ambulatory Visit (INDEPENDENT_AMBULATORY_CARE_PROVIDER_SITE_OTHER): Payer: Medicare Other | Admitting: Physical Therapy

## 2022-01-05 DIAGNOSIS — Z961 Presence of intraocular lens: Secondary | ICD-10-CM | POA: Diagnosis not present

## 2022-01-05 DIAGNOSIS — M6281 Muscle weakness (generalized): Secondary | ICD-10-CM

## 2022-01-05 DIAGNOSIS — H04123 Dry eye syndrome of bilateral lacrimal glands: Secondary | ICD-10-CM | POA: Diagnosis not present

## 2022-01-05 DIAGNOSIS — R2689 Other abnormalities of gait and mobility: Secondary | ICD-10-CM

## 2022-01-05 DIAGNOSIS — H5203 Hypermetropia, bilateral: Secondary | ICD-10-CM | POA: Diagnosis not present

## 2022-01-05 DIAGNOSIS — H10413 Chronic giant papillary conjunctivitis, bilateral: Secondary | ICD-10-CM | POA: Diagnosis not present

## 2022-01-05 NOTE — Therapy (Signed)
OUTPATIENT PHYSICAL THERAPY TREATMENT   Patient Name: Allen Robinson MRN: [id number] DOB:1926/08/22, 88 y.o., male Today's Date: 01/05/2022    PT End of Session - 01/05/22 1249     Visit Number 7    Number of Visits 16    Authorization Type Medicare    PT Start Time 0848    PT Stop Time 0930    PT Time Calculation (min) 42 min    Equipment Utilized During Treatment Gait belt    Activity Tolerance Patient tolerated treatment well    Behavior During Therapy WFL for tasks assessed/performed                  Past Medical History:  Diagnosis Date   Arthritis    hands   Chicken pox    Hyperlipidemia    Pneumonia    Seasonal allergies    Past Surgical History:  Procedure Laterality Date   TONSILLECTOMY     1936   Patient Active Problem List   Diagnosis Date Noted   Senile purpura ([place]) 05/03/2020   Aortic atherosclerosis ([place]) 05/21/2019   Right rib fracture 05/15/2019   Hyperglycemia 04/28/2019   Gout 03/14/2017   Vitamin D deficiency 03/14/2017   BPH associated with nocturia 03/14/2017   Hyperlipidemia 03/14/2017   GERD (gastroesophageal reflux disease) 03/14/2017   Peripheral polyneuropathy 02/16/2016   Diplopia 11/19/2015    PCP: Marin Olp, MD  REFERRING PROVIDER: Marin Olp, MD  REFERRING DIAG: Gait/balance   THERAPY DIAG:  Other abnormalities of gait and mobility  Muscle weakness (generalized)    SUBJECTIVE:   SUBJECTIVE STATEMENT: No new complaints today .   PERTINENT HISTORY: none   PAIN:  Are you having pain? No   PRECAUTIONS: Fall   WEIGHT BEARING RESTRICTIONS No  FALLS:  Has patient fallen in last 6 months? Yes, Number of falls: 6 Pt reluctant to leave home: no,  Has had decreased activity: no   OCCUPATION: Retired   PLOF: Independent  PATIENT GOALS  Decreased falls, improved balance.    OBJECTIVE:   COGNITION:  Overall cognitive status: Within functional limits for tasks assessed     LE  AROM/PROM:  LEs: WFL, mild limitation for DF bilaterally   LE MMT:  Hips: 4/5, Knees: 4+/5   GAIT: Using SPC    TODAY'S TREATMENT: 01/05/22: Therapeutic Exercise: Aerobic: Supine:  Seated:  Standing:  Marching x 20; Squats x 20;  Stretches:  Gastroc x 2 min bil at counter.  Neuromuscular Re-education:   Staggered stance (1 foot on 6 in step) standing x 1 min, weight shifts x 15, head turns x 15;  Stairs up/down 5 steps with SPC, no hand rails, x 6;  Stepping with weight shifts x 10 bwd and lateral x 10 ea; Quick walking [facility]) with L/R head turns Self Care:     01/03/22: Therapeutic Exercise: Aerobic: Supine:  Seated:  sit to stand x 10 regular chair, no UE support  Standing:   Stretches:  Neuromuscular Re-education:   Toe taps in 6 in step x 20;  Staggered stance (1 foot on 6 in step) standing x 1 min; Stairs up/down 5 steps with SPC, no hand rails, x 6; Quick walking 35 ft x 6;  Bwd walking 25 ft x 6;  Squat to pick up cones 25 ft x 4;  Large steps with weight shifts and reach x 10 ea- diagonals and lateral;  Floor transfers x 2;  Self Care:    12/29/21: Therapeutic  Exercise: Aerobic: Supine:  Seated:   Standing:  Marching x 20 Stretches:  Neuromuscular Re-education:   Toe taps in 6 in step x 20; Step ups 6 in x 10 bil, 1 UE support; cross body press GTB x 10 bil;  Staggered stance (1 foot on 6 in step) standing x 1 min, then A/P weight shifts x 10 bil;  Education and practice outdoors for curb navigation using [facility];  Self Care: Education on safety with mobility, recommended pt not try to do stairs, curbs, or walking, without use of at least [facility]. Discussed multiple falls and taking extra time to plan movements.     PATIENT EDUCATION:  Education details: Discussed home safety, safety with community navigation, need to use AD.   Person educated: Patient Education method: Explanation Education comprehension: verbalized understanding, returned demonstration, and needs  further education   HOME EXERCISE PROGRAM: [id number]   ASSESSMENT:  CLINICAL IMPRESSION: 01/05/22: Pt challenged with and has decreased stability on stairs, more with descending, but is able to do with SPC and no hand rail. He is challenged with tandem stance position, but improved ability for 1 foot on step today from last visit. Pt to benefit from continued care.    Objective impairments include Abnormal gait, decreased activity tolerance, decreased balance, decreased coordination, decreased knowledge of use of DME, decreased mobility, difficulty walking, decreased strength, impaired flexibility, and improper body mechanics. These impairments are limiting patient from cleaning, community activity, yard work, and shopping. Personal factors including Age are also affecting patient's functional outcome. Patient will benefit from skilled PT to address above impairments and improve overall function.  REHAB POTENTIAL: Fair   CLINICAL DECISION MAKING: Stable/uncomplicated  EVALUATION COMPLEXITY: Low   GOALS: Goals reviewed with patient? Yes  SHORT TERM GOALS:  STG Name Target Date Goal status  1 Patient will be independent with initial HEP   12/21/21 INITIAL  2 Pt to demo ability for independent floor transfers 12/28/21 INITIAL         LONG TERM GOALS:   LTG Name Target Date Goal status  1 Patient will be independent with final HEP  02/01/22 INITIAL  2 Pt to demo improved score on  BERG, by at least 8 points   02/01/22 INITIAL  3 Pt to demonstrate improved score on DGI  (TBD)   02/01/22 INITIAL       4 Pt to demo ability for stair navigation, reciprocal pattern, with 1 UE support, and not pain, to improve ability for home and community navigation.   02/01/22 INITIAL      PLAN: PT FREQUENCY: 2x/week  PT DURATION: 8 weeks  PLANNED INTERVENTIONS: Therapeutic exercises, Therapeutic activity, Neuro Muscular re-education, Balance training, Gait training, Patient/Family education,  Joint mobilization, Stair training, DME instructions, Dry Needling, Electrical stimulation, Spinal mobilization, Cryotherapy, Moist heat, Taping, Vasopneumatic device, Traction, Ionotophoresis 4mg /ml Dexamethasone, and Manual therapy  PLAN FOR NEXT SESSION:    Lyndee Hensen, PT, DPT 12:55 PM  01/05/22

## 2022-01-10 ENCOUNTER — Other Ambulatory Visit: Payer: Self-pay

## 2022-01-10 ENCOUNTER — Ambulatory Visit (INDEPENDENT_AMBULATORY_CARE_PROVIDER_SITE_OTHER): Payer: Medicare Other | Admitting: Physical Therapy

## 2022-01-10 ENCOUNTER — Encounter: Payer: Medicare Other | Admitting: Physical Therapy

## 2022-01-10 ENCOUNTER — Encounter: Payer: Self-pay | Admitting: Physical Therapy

## 2022-01-10 DIAGNOSIS — R2689 Other abnormalities of gait and mobility: Secondary | ICD-10-CM | POA: Diagnosis not present

## 2022-01-10 DIAGNOSIS — M6281 Muscle weakness (generalized): Secondary | ICD-10-CM

## 2022-01-10 NOTE — Therapy (Signed)
OUTPATIENT PHYSICAL THERAPY TREATMENT   Patient Name: Allen Robinson MRN: [id number] DOB:1926-11-10, 88 y.o., male Today's Date: 01/10/2022    PT End of Session - 01/10/22 1407     Visit Number 8    Number of Visits 16    Authorization Type Medicare    PT Start Time 0254    PT Stop Time 1430    PT Time Calculation (min) 42 min    Equipment Utilized During Treatment Gait belt    Activity Tolerance Patient tolerated treatment well    Behavior During Therapy WFL for tasks assessed/performed                   Past Medical History:  Diagnosis Date   Arthritis    hands   Chicken pox    Hyperlipidemia    Pneumonia    Seasonal allergies    Past Surgical History:  Procedure Laterality Date   TONSILLECTOMY     1936   Patient Active Problem List   Diagnosis Date Noted   Senile purpura ([place]) 05/03/2020   Aortic atherosclerosis ([place]) 05/21/2019   Right rib fracture 05/15/2019   Hyperglycemia 04/28/2019   Gout 03/14/2017   Vitamin D deficiency 03/14/2017   BPH associated with nocturia 03/14/2017   Hyperlipidemia 03/14/2017   GERD (gastroesophageal reflux disease) 03/14/2017   Peripheral polyneuropathy 02/16/2016   Diplopia 11/19/2015    PCP: Marin Olp, MD  REFERRING PROVIDER: Marin Olp, MD  REFERRING DIAG: Gait/balance   THERAPY DIAG:  Other abnormalities of gait and mobility  Muscle weakness (generalized)    SUBJECTIVE:   SUBJECTIVE STATEMENT: No new complaints today .   PERTINENT HISTORY: none   PAIN:  Are you having pain? No   PRECAUTIONS: Fall   WEIGHT BEARING RESTRICTIONS No  FALLS:  Has patient fallen in last 6 months? Yes, Number of falls: 6 Pt reluctant to leave home: no,  Has had decreased activity: no   OCCUPATION: Retired   PLOF: Independent  PATIENT GOALS  Decreased falls, improved balance.    OBJECTIVE:   COGNITION:  Overall cognitive status: Within functional limits for tasks  assessed     LE AROM/PROM:  LEs: WFL, mild limitation for DF bilaterally   LE MMT:  Hips: 4/5, Knees: 4+/5   GAIT: Using SPC    TODAY'S TREATMENT: 01/10/22: Therapeutic Exercise: Aerobic: Supine:  Seated: Sit to stand x 10;  Standing:  Marching x 20 no UE support ;  Squats x 20;  Stretches:  Neuromuscular Re-education:   Staggered stance (1 foot on 6 in step) standing x 1 min, weight shifts x 15, head turns x 15;  Stairs up/down 5 steps with SPC, no hand rails, x 6;  Quick walking [facility]) with (therapist directed) direction changes x 6 min Self Care:    01/05/22: Therapeutic Exercise: Aerobic: Supine:  Seated:  Standing:  Marching x 20; Squats x 20;  Stretches:  Gastroc x 2 min bil at counter.  Neuromuscular Re-education:   Staggered stance (1 foot on 6 in step) standing x 1 min, weight shifts x 15, head turns x 15;  Stairs up/down 5 steps with SPC, no hand rails, x 6;  Stepping with weight shifts x 10 bwd and lateral x 10 ea; Quick walking [facility]) with L/R head turns Self Care:     01/03/22: Therapeutic Exercise: Aerobic: Supine:  Seated:  sit to stand x 10 regular chair, no UE support  Standing:   Stretches:  Neuromuscular Re-education:  Toe taps in 6 in step x 20;  Staggered stance (1 foot on 6 in step) standing x 1 min; Stairs up/down 5 steps with SPC, no hand rails, x 6; Quick walking 35 ft x 6;  Bwd walking 25 ft x 6;  Squat to pick up cones 25 ft x 4;  Large steps with weight shifts and reach x 10 ea- diagonals and lateral;  Floor transfers x 2;  Self Care:    12/29/21: Therapeutic Exercise: Aerobic: Supine:  Seated:   Standing:  Marching x 20 Stretches:  Neuromuscular Re-education:   Toe taps in 6 in step x 20; Step ups 6 in x 10 bil, 1 UE support; cross body press GTB x 10 bil;  Staggered stance (1 foot on 6 in step) standing x 1 min, then A/P weight shifts x 10 bil;  Education and practice outdoors for curb navigation using [facility];  Self Care: Education on  safety with mobility, recommended pt not try to do stairs, curbs, or walking, without use of at least [facility]. Discussed multiple falls and taking extra time to plan movements.     PATIENT EDUCATION:  Education details: Reviewed HEP  Person educated: Patient Education method: Explanation Education comprehension: verbalized understanding, returned demonstration, and needs further education   HOME EXERCISE PROGRAM: [id number]   ASSESSMENT:  CLINICAL IMPRESSION: 01/10/22: Pt challenged with all activities today. Education and practice for slowing down/stopping with direction changes vs pivot turn. Pt continues to state decreased confidence on stairs, and has difficulty with consistent sequencing with SPC, requiring VC s. Pt to benefit from continued work on dynamic balance, stairs, and dynamic gait for improved safety and falls.    Objective impairments include Abnormal gait, decreased activity tolerance, decreased balance, decreased coordination, decreased knowledge of use of DME, decreased mobility, difficulty walking, decreased strength, impaired flexibility, and improper body mechanics. These impairments are limiting patient from cleaning, community activity, yard work, and shopping. Personal factors including Age are also affecting patient's functional outcome. Patient will benefit from skilled PT to address above impairments and improve overall function.  REHAB POTENTIAL: Fair   CLINICAL DECISION MAKING: Stable/uncomplicated  EVALUATION COMPLEXITY: Low   GOALS: Goals reviewed with patient? Yes  SHORT TERM GOALS:  STG Name Target Date Goal status  1 Patient will be independent with initial HEP   12/21/21 INITIAL  2 Pt to demo ability for independent floor transfers 12/28/21 INITIAL         LONG TERM GOALS:   LTG Name Target Date Goal status  1 Patient will be independent with final HEP  02/01/22 INITIAL  2 Pt to demo improved score on  BERG, by at least 8 points   02/01/22  INITIAL  3 Pt to demonstrate improved score on DGI  (TBD)   02/01/22 INITIAL       4 Pt to demo ability for stair navigation, reciprocal pattern, with 1 UE support, and not pain, to improve ability for home and community navigation.   02/01/22 INITIAL      PLAN: PT FREQUENCY: 2x/week  PT DURATION: 8 weeks  PLANNED INTERVENTIONS: Therapeutic exercises, Therapeutic activity, Neuro Muscular re-education, Balance training, Gait training, Patient/Family education, Joint mobilization, Stair training, DME instructions, Dry Needling, Electrical stimulation, Spinal mobilization, Cryotherapy, Moist heat, Taping, Vasopneumatic device, Traction, Ionotophoresis 4mg /ml Dexamethasone, and Manual therapy  PLAN FOR NEXT SESSION:    Lyndee Hensen, PT, DPT 2:35 PM  01/10/22

## 2022-01-12 ENCOUNTER — Encounter: Payer: Self-pay | Admitting: Physical Therapy

## 2022-01-12 ENCOUNTER — Ambulatory Visit (INDEPENDENT_AMBULATORY_CARE_PROVIDER_SITE_OTHER): Payer: Medicare Other | Admitting: Physical Therapy

## 2022-01-12 ENCOUNTER — Other Ambulatory Visit: Payer: Self-pay

## 2022-01-12 DIAGNOSIS — R2689 Other abnormalities of gait and mobility: Secondary | ICD-10-CM

## 2022-01-12 DIAGNOSIS — M6281 Muscle weakness (generalized): Secondary | ICD-10-CM

## 2022-01-12 NOTE — Therapy (Signed)
OUTPATIENT PHYSICAL THERAPY TREATMENT   Patient Name: Tarance Balan MRN: [id number] DOB:09/17/1926, 88 y.o., male Today's Date: 01/12/2022    PT End of Session - 01/12/22 1144     Visit Number 9    Number of Visits 16    Date for PT Re-Evaluation 02/01/22    Authorization Type Medicare    PT Start Time 1103    PT Stop Time 1145    PT Time Calculation (min) 42 min    Equipment Utilized During Treatment Gait belt    Activity Tolerance Patient tolerated treatment well    Behavior During Therapy WFL for tasks assessed/performed                    Past Medical History:  Diagnosis Date   Arthritis    hands   Chicken pox    Hyperlipidemia    Pneumonia    Seasonal allergies    Past Surgical History:  Procedure Laterality Date   TONSILLECTOMY     1936   Patient Active Problem List   Diagnosis Date Noted   Senile purpura ([place]) 05/03/2020   Aortic atherosclerosis ([place]) 05/21/2019   Right rib fracture 05/15/2019   Hyperglycemia 04/28/2019   Gout 03/14/2017   Vitamin D deficiency 03/14/2017   BPH associated with nocturia 03/14/2017   Hyperlipidemia 03/14/2017   GERD (gastroesophageal reflux disease) 03/14/2017   Peripheral polyneuropathy 02/16/2016   Diplopia 11/19/2015    PCP: Marin Olp, MD  REFERRING PROVIDER: Marin Olp, MD  REFERRING DIAG: Gait/balance   THERAPY DIAG:  Other abnormalities of gait and mobility  Muscle weakness (generalized)    SUBJECTIVE:   SUBJECTIVE STATEMENT: Pt with no new complaints. Would like to keep practicing stairs.    PERTINENT HISTORY: none   PAIN:  Are you having pain? No   PRECAUTIONS: Fall   WEIGHT BEARING RESTRICTIONS No  FALLS:  Has patient fallen in last 6 months? Yes, Number of falls: 6 Pt reluctant to leave home: no,  Has had decreased activity: no   OCCUPATION: Retired   PLOF: Independent  PATIENT GOALS  Decreased falls, improved balance.    OBJECTIVE:    COGNITION:  Overall cognitive status: Within functional limits for tasks assessed     LE AROM/PROM:  LEs: WFL, mild limitation for DF bilaterally   LE MMT:  Hips: 4/5, Knees: 4+/5   GAIT: Using SPC    TODAY'S TREATMENT:  01/12/22: Therapeutic Exercise: Aerobic: Supine:  Seated:   Standing:  Marching x 20 no UE support ;  Squats x 20;   Stretches: Seated fig 4 piriformis 30 sec x 3 bil;  Neuromuscular Re-education:   Staggered stance (1 foot on 6 in step) standing x 1 min, weight shifts x 15, UE elevation x 15;  Stairs up/down 5 steps with SPC, no hand rails, x 6;   Large amplitude stepping with UE reach, x 10 fwd and laterally, bil;  Standing: posterior perturbations (mod- heavy) with practice for posterior stepping with LOB.  Gait: Quick walking [facility]) with (therapist directed) direction changes x 6 min (holding 6lb weight in L hand) ; walk out of clinic, with education and discussion about using ramp to flat surface vs stepping off curb (without AD- pt forgot cane today)     01/10/22: Therapeutic Exercise: Supine:  Seated: Sit to stand x 10;  Standing:  Marching x 20 no UE support ;  Squats x 20;  Stretches:  Neuromuscular Re-education:   Staggered stance (1  foot on 6 in step) standing x 1 min, weight shifts x 15, head turns x 15;  Stairs up/down 5 steps with SPC, no hand rails, x 6;  Quick walking [facility]) with (therapist directed) direction changes x 6 min Self Care:    01/05/22: Therapeutic Exercise: Aerobic: Supine:  Seated:  Standing:  Marching x 20; Squats x 20;  Stretches:  Gastroc x 2 min bil at counter.  Neuromuscular Re-education:   Staggered stance (1 foot on 6 in step) standing x 1 min, weight shifts x 15, head turns x 15;  Stairs up/down 5 steps with SPC, no hand rails, x 6;  Stepping with weight shifts x 10 bwd and lateral x 10 ea; Quick walking [facility]) with L/R head turns Self Care:    PATIENT EDUCATION:  Education details: Reviewed HEP  Person  educated: Patient Education method: Explanation Education comprehension: verbalized understanding, returned demonstration, and needs further education   HOME EXERCISE PROGRAM: [email or web address]   ASSESSMENT:  CLINICAL IMPRESSION: 01/12/22: Focus on NMR and gait training today. Pt with improved ability for stop/turn with walking, vs continuous pivot from previous session with direction changes. Education outside for safety with using ramp to flat surface vs stepping off curb without AD today. Pt with improving ability for stairs, does require verbal cueing for cane position when descending.    Objective impairments include Abnormal gait, decreased activity tolerance, decreased balance, decreased coordination, decreased knowledge of use of DME, decreased mobility, difficulty walking, decreased strength, impaired flexibility, and improper body mechanics. These impairments are limiting patient from cleaning, community activity, yard work, and shopping. Personal factors including Age are also affecting patient's functional outcome. Patient will benefit from skilled PT to address above impairments and improve overall function.  REHAB POTENTIAL: Fair   CLINICAL DECISION MAKING: Stable/uncomplicated  EVALUATION COMPLEXITY: Low   GOALS: Goals reviewed with patient? Yes  SHORT TERM GOALS:  STG Name Target Date Goal status  1 Patient will be independent with initial HEP   12/21/21 INITIAL  2 Pt to demo ability for independent floor transfers 12/28/21 INITIAL         LONG TERM GOALS:   LTG Name Target Date Goal status  1 Patient will be independent with final HEP  02/01/22 INITIAL  2 Pt to demo improved score on  BERG, by at least 8 points   02/01/22 INITIAL  3 Pt to demonstrate improved score on DGI  (TBD)   02/01/22 INITIAL       4 Pt to demo ability for stair navigation, reciprocal pattern, with 1 UE support, and not pain, to improve ability for home and community navigation.   02/01/22 INITIAL       PLAN: PT FREQUENCY: 2x/week  PT DURATION: 8 weeks  PLANNED INTERVENTIONS: Therapeutic exercises, Therapeutic activity, Neuro Muscular re-education, Balance training, Gait training, Patient/Family education, Joint mobilization, Stair training, DME instructions, Dry Needling, Electrical stimulation, Spinal mobilization, Cryotherapy, Moist heat, Taping, Vasopneumatic device, Traction, Ionotophoresis 4mg /ml Dexamethasone, and Manual therapy  PLAN FOR NEXT SESSION:    Lyndee Hensen, PT, DPT 11:59 AM  01/12/22

## 2022-01-17 ENCOUNTER — Encounter: Payer: Self-pay | Admitting: Physical Therapy

## 2022-01-17 ENCOUNTER — Other Ambulatory Visit: Payer: Self-pay

## 2022-01-17 ENCOUNTER — Telehealth: Payer: Self-pay | Admitting: Family Medicine

## 2022-01-17 ENCOUNTER — Ambulatory Visit (INDEPENDENT_AMBULATORY_CARE_PROVIDER_SITE_OTHER): Payer: Medicare Other | Admitting: Physical Therapy

## 2022-01-17 DIAGNOSIS — R2689 Other abnormalities of gait and mobility: Secondary | ICD-10-CM | POA: Diagnosis not present

## 2022-01-17 DIAGNOSIS — M6281 Muscle weakness (generalized): Secondary | ICD-10-CM | POA: Diagnosis not present

## 2022-01-17 NOTE — Telephone Encounter (Signed)
Pt needs to schedule for this thursday, and then schedule out for a few weeks, 2x/wk for 3-4 weeks. This is for PT with Ander Purpura

## 2022-01-17 NOTE — Therapy (Signed)
OUTPATIENT PHYSICAL THERAPY TREATMENT/Progress Note    Patient Name: Allen Robinson MRN: [id number] DOB:[birth year], 88 y.o., male Today's Date: 01/17/2022  Physical Therapy Progress Note  Dates of Reporting Period: 12/07/21  to  01/17/22    PT End of Session - 01/17/22 1108     Visit Number 10    Number of Visits 16    Date for PT Re-Evaluation 02/01/22    Authorization Type Medicare    Equipment Utilized During Treatment Gait belt    Activity Tolerance Patient tolerated treatment well    Behavior During Therapy [facility] for tasks assessed/performed                    Past Medical History:  Diagnosis Date   Arthritis    hands   Chicken pox    Hyperlipidemia    Pneumonia    Seasonal allergies    Past Surgical History:  Procedure Laterality Date   TONSILLECTOMY     1936   Patient Active Problem List   Diagnosis Date Noted   Senile purpura ([place]) 05/03/2020   Aortic atherosclerosis ([place]) 05/21/2019   Right rib fracture 05/15/2019   Hyperglycemia 04/28/2019   Gout 03/14/2017   Vitamin D deficiency 03/14/2017   BPH associated with nocturia 03/14/2017   Hyperlipidemia 03/14/2017   GERD (gastroesophageal reflux disease) 03/14/2017   Peripheral polyneuropathy 02/16/2016   Diplopia 11/19/2015    PCP: Marin Olp, MD  REFERRING PROVIDER: Marin Olp, MD  REFERRING DIAG: Gait/balance   THERAPY DIAG:  Other abnormalities of gait and mobility  Muscle weakness (generalized)   SUBJECTIVE:   SUBJECTIVE STATEMENT: Pt with no new complaints. Would like to keep practicing stairs.  Needs to leave in 30 min for another appt.   PERTINENT HISTORY: none   PAIN:  Are you having pain? No   PRECAUTIONS: Fall   WEIGHT BEARING RESTRICTIONS No  FALLS:  Has patient fallen in last 6 months? Yes, Number of falls: 6 Pt reluctant to leave home: no,  Has had decreased activity: no   OCCUPATION: Retired   PLOF: Independent  PATIENT GOALS   Decreased falls, improved balance.    OBJECTIVE:   COGNITION:  Overall cognitive status: Within functional limits for tasks assessed     LE AROM/PROM:  LEs: WFL, mild limitation for DF bilaterally   LE MMT:  Hips: 4/5, Knees: 4+/5   GAIT: Using [facility]    [facility] PT Assessment - 01/17/22 0001       Standardized Balance Assessment   Standardized Balance Assessment Berg Balance Test;Dynamic Gait Index;Timed Up and Go Test      Berg Balance Test   Sit to Stand Able to stand without using hands and stabilize independently    Standing Unsupported Able to stand safely 2 minutes    Sitting with Back Unsupported but Feet Supported on Floor or Stool Able to sit safely and securely 2 minutes    Stand to Sit Sits safely with minimal use of hands    Transfers Able to transfer safely, minor use of hands    Standing Unsupported with Eyes Closed Able to stand 10 seconds safely    Standing Unsupported with Feet Together Able to place feet together independently and stand for 1 minute with supervision    From Standing, Reach Forward with Outstretched Arm Can reach confidently >25 cm (10")    From Standing Position, Pick up Object from Floor Able to pick up shoe safely and easily  From Standing Position, Turn to Look Behind Over each Shoulder Turn sideways only but maintains balance    Turn 360 Degrees Able to turn 360 degrees safely in 4 seconds or less    Standing Unsupported, Alternately Place Feet on Step/Stool Able to complete >2 steps/needs minimal assist    Standing Unsupported, One Foot in Front Able to take small step independently and hold 30 seconds    Standing on One Leg Tries to lift leg/unable to hold 3 seconds but remains standing independently    Total Score 45      Dynamic Gait Index   Level Surface Normal    Change in Gait Speed Mild Impairment    Gait with Horizontal Head Turns Moderate Impairment    Gait with Vertical Head Turns Mild Impairment    Gait and Pivot Turn Mild  Impairment    Step Over Obstacle Mild Impairment    Step Around Obstacles Mild Impairment    Steps Moderate Impairment    Total Score 15               TODAY'S TREATMENT:  01/17/22: Therapeutic Exercise: Neuromuscular Re-education:   Stairs up/down 5 steps with 1 HR, no AD x 6;  Bwd walking 25 ft x 6; Quick walking 35 ft x 6; Curb navigation outdoors with SPC, x 10;        PATIENT EDUCATION:  Education details: Reviewed HEP  Person educated: Patient Education method: Explanation Education comprehension: verbalized understanding, returned demonstration, and needs further education   HOME EXERCISE PROGRAM: [id number]   ASSESSMENT:  CLINICAL IMPRESSION: 01/17/22: PN: Balance tests repeated today, pt with improved scores on DGI and BERG.  Pt with improving ability and confidence on curb, with education and practice. Does require use of AD for safety. Pt continues to have difficulty with stairs, direction changes, unlevel surfaces. Pt to benefit from continued care to improve safety with ambulation and to meet LTGs.    Objective impairments include Abnormal gait, decreased activity tolerance, decreased balance, decreased coordination, decreased knowledge of use of DME, decreased mobility, difficulty walking, decreased strength, impaired flexibility, and improper body mechanics. These impairments are limiting patient from cleaning, community activity, yard work, and shopping. Personal factors including Age are also affecting patient's functional outcome. Patient will benefit from skilled PT to address above impairments and improve overall function.  REHAB POTENTIAL: Fair   CLINICAL DECISION MAKING: Stable/uncomplicated  EVALUATION COMPLEXITY: Low   GOALS: Goals reviewed with patient? Yes  SHORT TERM GOALS:  STG Name Target Date Goal status  1 Patient will be independent with initial HEP   12/21/21 MET  2 Pt to demo ability for independent floor transfers 12/28/21 MET          LONG TERM GOALS:   LTG Name Target Date Goal status  1 Patient will be independent with final HEP  02/01/22 Partially met   2 Pt to demo improved score on  BERG, by at least 8 points   02/01/22 Partially met   3 Pt to demonstrate improved score on DGI  by at least 5 points   02/01/22 Partially met        4 Pt to demo ability for stair navigation, reciprocal pattern, with 1 UE support, and not pain, to improve ability for home and community navigation.   02/01/22 ongoing      PLAN: PT FREQUENCY: 2x/week  PT DURATION: 8 weeks  PLANNED INTERVENTIONS: Therapeutic exercises, Therapeutic activity, Neuro Muscular re-education, Balance training, Gait training, Patient/Family  education, Joint mobilization, Stair training, DME instructions, Dry Needling, Electrical stimulation, Spinal mobilization, Cryotherapy, Moist heat, Taping, Vasopneumatic device, Traction, Ionotophoresis 68m/ml Dexamethasone, and Manual therapy  PLAN FOR NEXT SESSION:    LLyndee Hensen PT, DPT 11:08 AM  01/17/22

## 2022-01-19 ENCOUNTER — Encounter: Payer: Self-pay | Admitting: Physical Therapy

## 2022-01-19 ENCOUNTER — Encounter: Payer: Medicare Other | Admitting: Physical Therapy

## 2022-01-19 ENCOUNTER — Ambulatory Visit (INDEPENDENT_AMBULATORY_CARE_PROVIDER_SITE_OTHER): Payer: Medicare Other | Admitting: Physical Therapy

## 2022-01-19 ENCOUNTER — Other Ambulatory Visit: Payer: Self-pay

## 2022-01-19 DIAGNOSIS — R2689 Other abnormalities of gait and mobility: Secondary | ICD-10-CM

## 2022-01-19 DIAGNOSIS — M6281 Muscle weakness (generalized): Secondary | ICD-10-CM

## 2022-01-19 NOTE — Therapy (Signed)
?OUTPATIENT PHYSICAL THERAPY TREATMENT ? ? ?Patient Name: Allen Robinson [facility] ?MRN: [id number] ?DOB:Jan 12, 1926, 88 y.o., male ?Today's Date: 01/19/2022 ? ? ? ? ? PT End of Session - 01/19/22 2050   ? ? Visit Number 11   ? Number of Visits 16   ? Date for PT Re-Evaluation 02/01/22   ? Authorization Type Medicare  PN done at visit 10.   ? PT Start Time 1520   ? PT Stop Time 1558   ? PT Time Calculation (min) 38 min   ? Equipment Utilized During Treatment Gait belt   ? Activity Tolerance Patient tolerated treatment well   ? Behavior During Therapy [facility] for tasks assessed/performed   ? ?  ?  ? ?  ? ? ? ? ? ? ? ? ? ?Past Medical History:  ?Diagnosis Date  ? Arthritis   ? hands  ? Chicken pox   ? Hyperlipidemia   ? Pneumonia   ? Seasonal allergies   ? ?Past Surgical History:  ?Procedure Laterality Date  ? TONSILLECTOMY    ? 1936  ? ?Patient Active Problem List  ? Diagnosis Date Noted  ? Senile purpura ([place]) 05/03/2020  ? Aortic atherosclerosis ([place]) 05/21/2019  ? Right rib fracture 05/15/2019  ? Hyperglycemia 04/28/2019  ? Gout 03/14/2017  ? Vitamin D deficiency 03/14/2017  ? BPH associated with nocturia 03/14/2017  ? Hyperlipidemia 03/14/2017  ? GERD (gastroesophageal reflux disease) 03/14/2017  ? Peripheral polyneuropathy 02/16/2016  ? Diplopia 11/19/2015  ? ? ?PCP: Marin Olp, MD ? ?REFERRING PROVIDER: Marin Olp, MD ? ?REFERRING DIAG: Gait/balance  ? ?THERAPY DIAG:  ?Other abnormalities of gait and mobility ? ?Muscle weakness (generalized) ? ? ?SUBJECTIVE:  ? ?SUBJECTIVE STATEMENT: ?Pt with no new complaints.  ? ?PERTINENT HISTORY: none  ? ?PAIN:  ?Are you having pain? No ? ? ?PRECAUTIONS: Fall  ? ?WEIGHT BEARING RESTRICTIONS No ? ?FALLS:  ?Has patient fallen in last 6 months? Yes, Number of falls: 6 ?Pt reluctant to leave home: no,  Has had decreased activity: no  ? ?OCCUPATION: Retired  ? ?PLOF: Independent ? ?PATIENT GOALS  Decreased falls, improved balance.  ? ? ?OBJECTIVE:  ? ?COGNITION: ? Overall  cognitive status: Within functional limits for tasks assessed   ? ? ?LE AROM/PROM: ? LEs: WFL, mild limitation for DF bilaterally  ? ?LE MMT: ? Hips: 4/5, Knees: 4+/5  ? ?GAIT: ?Using SPC ? ? ? ? ? ? ? ?TODAY'S TREATMENT: ? ?01/19/22: ?Therapeutic Exercise:  Recumbent bike x 5 min L 1; Sit to stand x 10 reg arm chair, no UE support. HR with post weight shifts x 10; ?Standing:  ?Neuromuscular Re-education:   Bwd walking 25 ft x 4; Quick walking 35 ft x 6; L/R large steps with reach x 15 bil; L pivot step x 15; standing torso turns x 10 bil; UE fwd and rotation press 4lb bil x 10 ea;  AirEx: A/P weight shifts x 30;   ? ?01/17/22: ?Therapeutic Exercise: ?Neuromuscular Re-education:   Stairs up/down 5 steps with 1 HR, no AD x 6;  Bwd walking 25 ft x 6; Quick walking 35 ft x 6; Curb navigation outdoors with SPC, x 10;  ? ? ? ? ? ? ?PATIENT EDUCATION:  ?Education details: Reviewed HEP  ?Person educated: Patient ?Education method: Explanation ?Education comprehension: verbalized understanding, returned demonstration, and needs further education ? ? ?HOME EXERCISE PROGRAM: ?[id number] ? ? ?ASSESSMENT: ? ?CLINICAL IMPRESSION: ?01/19/22: Pt with difficulty with posterior weight shifts today. Pt  to benefit from continued practice with this, as well as unlevel surfaces, stairs, and dynamic balance.  ? ? ?Objective impairments include Abnormal gait, decreased activity tolerance, decreased balance, decreased coordination, decreased knowledge of use of DME, decreased mobility, difficulty walking, decreased strength, impaired flexibility, and improper body mechanics. These impairments are limiting patient from cleaning, community activity, yard work, and shopping. Personal factors including Age are also affecting patient's functional outcome. Patient will benefit from skilled PT to address above impairments and improve overall function. ? ?REHAB POTENTIAL: Fair  ? ?CLINICAL DECISION MAKING: Stable/uncomplicated ? ?EVALUATION COMPLEXITY:  Low ? ? ?GOALS: ?Goals reviewed with patient? Yes ? ?SHORT TERM GOALS: ? ?STG Name Target Date Goal status  ?1 Patient will be independent with initial HEP  ? 12/21/21 MET  ?2 Pt to demo ability for independent floor transfers 12/28/21 MET  ?  ?    ? ?LONG TERM GOALS:  ? ?LTG Name Target Date Goal status  ?1 Patient will be independent with final HEP ? 02/01/22 Partially met   ?2 Pt to demo improved score on  BERG, by at least 8 points  ? 02/01/22 Partially met   ?3 Pt to demonstrate improved score on DGI  by at least 5 points  ? 02/01/22 Partially met   ?     ?4 Pt to demo ability for stair navigation, reciprocal pattern, with 1 UE support, and not pain, to improve ability for home and community navigation.  ? 02/01/22 ongoing  ?  ? ? ?PLAN: ?PT FREQUENCY: 2x/week ? ?PT DURATION: 8 weeks ? ?PLANNED INTERVENTIONS: Therapeutic exercises, Therapeutic activity, Neuro Muscular re-education, Balance training, Gait training, Patient/Family education, Joint mobilization, Stair training, DME instructions, Dry Needling, Electrical stimulation, Spinal mobilization, Cryotherapy, Moist heat, Taping, Vasopneumatic device, Traction, Ionotophoresis 6m/ml Dexamethasone, and Manual therapy ? ?PLAN FOR NEXT SESSION:  ? ? ?LLyndee Hensen PT, DPT ?8:51 PM  01/19/22 ? ? ?

## 2022-01-24 ENCOUNTER — Ambulatory Visit (INDEPENDENT_AMBULATORY_CARE_PROVIDER_SITE_OTHER): Payer: Medicare Other | Admitting: Physical Therapy

## 2022-01-24 ENCOUNTER — Encounter: Payer: Self-pay | Admitting: Physical Therapy

## 2022-01-24 DIAGNOSIS — M6281 Muscle weakness (generalized): Secondary | ICD-10-CM

## 2022-01-24 DIAGNOSIS — R2689 Other abnormalities of gait and mobility: Secondary | ICD-10-CM | POA: Diagnosis not present

## 2022-01-24 NOTE — Therapy (Signed)
?OUTPATIENT PHYSICAL THERAPY TREATMENT ? ? ?Patient Name: Allen Robinson [facility] ?MRN: [id number] ?DOB:[birth year], 88 y.o., male ?Today's Date: 01/24/2022 ? ? ? ? ? PT End of Session - 01/24/22 1114   ? ? Visit Number 12   ? Number of Visits 16   ? Date for PT Re-Evaluation 02/01/22   ? Authorization Type Medicare  PN done at visit 10.   ? PT Start Time 1020   ? PT Stop Time 1100   ? PT Time Calculation (min) 40 min   ? Equipment Utilized During Treatment Gait belt   ? Activity Tolerance Patient tolerated treatment well   ? Behavior During Therapy [facility] for tasks assessed/performed   ? ?  ?  ? ?  ? ? ? ? ? ? ? ? ? ? ?Past Medical History:  ?Diagnosis Date  ? Arthritis   ? hands  ? Chicken pox   ? Hyperlipidemia   ? Pneumonia   ? Seasonal allergies   ? ?Past Surgical History:  ?Procedure Laterality Date  ? TONSILLECTOMY    ? 1936  ? ?Patient Active Problem List  ? Diagnosis Date Noted  ? Senile purpura ([place]) 05/03/2020  ? Aortic atherosclerosis ([place]) 05/21/2019  ? Right rib fracture 05/15/2019  ? Hyperglycemia 04/28/2019  ? Gout 03/14/2017  ? Vitamin D deficiency 03/14/2017  ? BPH associated with nocturia 03/14/2017  ? Hyperlipidemia 03/14/2017  ? GERD (gastroesophageal reflux disease) 03/14/2017  ? Peripheral polyneuropathy 02/16/2016  ? Diplopia 11/19/2015  ? ? ?PCP: Marin Olp, MD ? ?REFERRING PROVIDER: Marin Olp, MD ? ?REFERRING DIAG: Gait/balance  ? ?THERAPY DIAG:  ?Other abnormalities of gait and mobility ? ?Muscle weakness (generalized) ? ? ?SUBJECTIVE:  ? ?SUBJECTIVE STATEMENT: ?Pt had fall backwards on Saturday, he was out in the yard gardening and using wheelbarrow, tried to push it up a curb. Did not have cane with him outside. Did hit head, no obvious injury, states no pain, dizziness, or other injuries.  ? ?PERTINENT HISTORY: none  ? ?PAIN:  ?Are you having pain? No ? ? ?PRECAUTIONS: Fall  ? ?WEIGHT BEARING RESTRICTIONS No ? ?FALLS:  ?Has patient fallen in last 6 months? Yes, Number of falls:  6 ?Pt reluctant to leave home: no,  Has had decreased activity: no  ? ?OCCUPATION: Retired  ? ?PLOF: Independent ? ?PATIENT GOALS  Decreased falls, improved balance.  ? ? ?OBJECTIVE:  ? ?COGNITION: ? Overall cognitive status: Within functional limits for tasks assessed   ? ? ?LE AROM/PROM: ? LEs: WFL, mild limitation for DF bilaterally  ? ?LE MMT: ? Hips: 4/5, Knees: 4+/5  ? ?GAIT: ?Using SPC ? ? ? ? ? ?TODAY'S TREATMENT: ?01/24/22: ? ?Therapeutic Exercise:  Recumbent bike x 7 min L 1; HR with post weight shifts x 25 no UE support (CGA); Marching quick x 15, slow x 15; Gastroc stretch at counter 30 sec x 2 bil;  ? ?Neuromuscular Re-education:   Bwd walking 25 ft x 4 quick;  L/R and A/P weight shifts x 25 ea; fwd/bwd stepping x 10 bil (with cane); Step/touchx 20; Education and practice for dancing/weight shifts, need for use of SPC (for wedding he is attending this weekend).  ? ? ? ?01/19/22: ?Therapeutic Exercise:  Recumbent bike x 5 min L 1; Sit to stand x 10 reg arm chair, no UE support. HR with post weight shifts x 10; ?Standing:  ?Neuromuscular Re-education:   Bwd walking 25 ft x 4; Quick walking 35 ft x 6; L/R large  steps with reach x 15 bil; L pivot step x 15; standing torso turns x 10 bil; UE fwd and rotation press 4lb bil x 10 ea;  AirEx: A/P weight shifts x 30;   ? ? ? ? ?PATIENT EDUCATION:  ?Education details: Reviewed HEP, reviewed safety and need for use of SPC at all times, in detail.   ?Person educated: Patient ?Education method: Explanation ?Education comprehension: verbalized understanding, returned demonstration, and needs further education ? ? ?HOME EXERCISE PROGRAM: ?[id number] ? ? ?ASSESSMENT: ? ?CLINICAL IMPRESSION: ?01/24/22: Pt doing well in sessions with activities, bwd weight shifts, walking, stairs, and curbs. He continues to have LOB and falls at home, always when not using his cane, and attempting activity that is too difficult for his current ability. Discussed safety at length today, and need  to have AD at all times, as well as not doing much yard work, due to uneven surfaces, and continued,multiple falls. Pt to benefit from continued care, for continued work on posterior weight shifts, stairs, and improving safety and fall risk.  ? ? ?Objective impairments include Abnormal gait, decreased activity tolerance, decreased balance, decreased coordination, decreased knowledge of use of DME, decreased mobility, difficulty walking, decreased strength, impaired flexibility, and improper body mechanics. These impairments are limiting patient from cleaning, community activity, yard work, and shopping. Personal factors including Age are also affecting patient's functional outcome. Patient will benefit from skilled PT to address above impairments and improve overall function. ? ?REHAB POTENTIAL: Fair  ? ?CLINICAL DECISION MAKING: Stable/uncomplicated ? ?EVALUATION COMPLEXITY: Low ? ? ?GOALS: ?Goals reviewed with patient? Yes ? ?SHORT TERM GOALS: ? ?STG Name Target Date Goal status  ?1 Patient will be independent with initial HEP  ? 12/21/21 MET  ?2 Pt to demo ability for independent floor transfers 12/28/21 MET  ?  ?    ? ?LONG TERM GOALS:  ? ?LTG Name Target Date Goal status  ?1 Patient will be independent with final HEP ? 02/01/22 Partially met   ?2 Pt to demo improved score on  BERG, by at least 8 points  ? 02/01/22 Partially met   ?3 Pt to demonstrate improved score on DGI  by at least 5 points  ? 02/01/22 Partially met   ?     ?4 Pt to demo ability for stair navigation, reciprocal pattern, with 1 UE support, and not pain, to improve ability for home and community navigation.  ? 02/01/22 ongoing  ?  ? ? ?PLAN: ?PT FREQUENCY: 2x/week ? ?PT DURATION: 8 weeks ? ?PLANNED INTERVENTIONS: Therapeutic exercises, Therapeutic activity, Neuro Muscular re-education, Balance training, Gait training, Patient/Family education, Joint mobilization, Stair training, DME instructions, Dry Needling, Electrical stimulation, Spinal  mobilization, Cryotherapy, Moist heat, Taping, Vasopneumatic device, Traction, Ionotophoresis 24m/ml Dexamethasone, and Manual therapy ? ?PLAN FOR NEXT SESSION:  ? ? ?LLyndee Hensen PT, DPT ?11:15 AM  01/24/22 ? ? ?

## 2022-01-31 ENCOUNTER — Ambulatory Visit (INDEPENDENT_AMBULATORY_CARE_PROVIDER_SITE_OTHER): Payer: Medicare Other | Admitting: Physical Therapy

## 2022-01-31 ENCOUNTER — Encounter: Payer: Self-pay | Admitting: Physical Therapy

## 2022-01-31 DIAGNOSIS — R2689 Other abnormalities of gait and mobility: Secondary | ICD-10-CM

## 2022-01-31 DIAGNOSIS — M6281 Muscle weakness (generalized): Secondary | ICD-10-CM

## 2022-01-31 NOTE — Therapy (Signed)
?OUTPATIENT PHYSICAL THERAPY TREATMENT ? ? ?Patient Name: Allen Robinson [facility] ?MRN: [id number] ?DOB:[birth year], 88 y.o., male ?Today's Date: 01/31/2022 ? ? ? ? ? PT End of Session - 01/31/22 0931   ? ? Visit Number 13   ? Number of Visits 16   ? Date for PT Re-Evaluation 02/01/22   ? Authorization Type Medicare  PN done at visit 10.   ? PT Start Time [phone]   ? PT Stop Time 0930   ? PT Time Calculation (min) 40 min   ? Equipment Utilized During Treatment Gait belt   ? Activity Tolerance Patient tolerated treatment well   ? Behavior During Therapy [facility] for tasks assessed/performed   ? ?  ?  ? ?  ? ? ? ? ? ? ? ? ? ? ? ?Past Medical History:  ?Diagnosis Date  ? Arthritis   ? hands  ? Chicken pox   ? Hyperlipidemia   ? Pneumonia   ? Seasonal allergies   ? ?Past Surgical History:  ?Procedure Laterality Date  ? TONSILLECTOMY    ? 1936  ? ?Patient Active Problem List  ? Diagnosis Date Noted  ? Senile purpura ([place]) 05/03/2020  ? Aortic atherosclerosis ([place]) 05/21/2019  ? Right rib fracture 05/15/2019  ? Hyperglycemia 04/28/2019  ? Gout 03/14/2017  ? Vitamin D deficiency 03/14/2017  ? BPH associated with nocturia 03/14/2017  ? Hyperlipidemia 03/14/2017  ? GERD (gastroesophageal reflux disease) 03/14/2017  ? Peripheral polyneuropathy 02/16/2016  ? Diplopia 11/19/2015  ? ? ?PCP: Marin Olp, MD ? ?REFERRING PROVIDER: Marin Olp, MD ? ?REFERRING DIAG: Gait/balance  ? ?THERAPY DIAG:  ?Other abnormalities of gait and mobility ? ?Muscle weakness (generalized) ? ? ?SUBJECTIVE:  ? ?SUBJECTIVE STATEMENT: ?Pt with no new complaints.  ? ?PERTINENT HISTORY: none  ? ?PAIN:  ?Are you having pain? No ? ? ?PRECAUTIONS: Fall  ? ?WEIGHT BEARING RESTRICTIONS No ? ?FALLS:  ?Has patient fallen in last 6 months? Yes, Number of falls: 6 ?Pt reluctant to leave home: no,  Has had decreased activity: no  ? ?OCCUPATION: Retired  ? ?PLOF: Independent ? ?PATIENT GOALS  Decreased falls, improved balance.  ? ? ?OBJECTIVE:  ? ?COGNITION: ? Overall  cognitive status: Within functional limits for tasks assessed   ? ? ?LE AROM/PROM: ? LEs: WFL, mild limitation for DF bilaterally  ? ?LE MMT: ? Hips: 4/5, Knees: 4+/5  ? ?GAIT: ?Using SPC ? ? ? ? ? ?TODAY'S TREATMENT: ? ?01/31/22: ?Therapeutic Exercise:   ?Aerobic: Recumbent bike x 8 min L 2 ;  ?Standing:  Gastroc stretch at counter 30 sec x 2 bil;  sit to stand x10; ?Neuromuscular Re-education:   Bwd walking 25 ft x 4 quick;  AirEx: A/P weight shifts x 25 ea; HR with post weight shift x 15;  ArEx; lateral step ups x 20; Squat to pick up object on floor 3 x 3; Toe taps on 6 in step x 20; Static standing with 1 foot in step x 1 min bil; with head turns x 20 bil;  ? ?01/24/22: ? ?Therapeutic Exercise:  Recumbent bike x 7 min L 1; HR with post weight shifts x 25 no UE support (CGA); Marching quick x 15, slow x 15; Gastroc stretch at counter 30 sec x 2 bil;  ? ?Neuromuscular Re-education:   Bwd walking 25 ft x 4 quick;  L/R and A/P weight shifts x 25 ea; fwd/bwd stepping x 10 bil (with cane); Step/touchx 20; Education and practice for dancing/weight shifts,  need for use of SPC (for wedding he is attending this weekend).  ? ? ? ?01/19/22: ?Therapeutic Exercise:  Recumbent bike x 5 min L 1; Sit to stand x 10 reg arm chair, no UE support. HR with post weight shifts x 10; ?Standing:  ?Neuromuscular Re-education:   Bwd walking 25 ft x 4; Quick walking 35 ft x 6; L/R large steps with reach x 15 bil; L pivot step x 15; standing torso turns x 10 bil; UE fwd and rotation press 4lb bil x 10 ea;  AirEx: A/P weight shifts x 30;   ? ? ?PATIENT EDUCATION:  ?Education details: Reviewed HEP, reviewed safety and need for use of SPC at all times, in detail.   ?Person educated: Patient ?Education method: Explanation ?Education comprehension: verbalized understanding, returned demonstration, and needs further education ? ? ?HOME EXERCISE PROGRAM: ?[id number] ? ? ?ASSESSMENT: ? ?CLINICAL IMPRESSION: ?01/31/22: Pt challenged with posterior weight  shifts and quite unstable with standing with 1 foot on step. Will continue to benefit from practice on stairs, as well as dynamic balance for improving safety.  ? ?Objective impairments include Abnormal gait, decreased activity tolerance, decreased balance, decreased coordination, decreased knowledge of use of DME, decreased mobility, difficulty walking, decreased strength, impaired flexibility, and improper body mechanics. These impairments are limiting patient from cleaning, community activity, yard work, and shopping. Personal factors including Age are also affecting patient's functional outcome. Patient will benefit from skilled PT to address above impairments and improve overall function. ? ?REHAB POTENTIAL: Fair  ? ?CLINICAL DECISION MAKING: Stable/uncomplicated ? ?EVALUATION COMPLEXITY: Low ? ? ?GOALS: ?Goals reviewed with patient? Yes ? ?SHORT TERM GOALS: ? ?STG Name Target Date Goal status  ?1 Patient will be independent with initial HEP  ? 12/21/21 MET  ?2 Pt to demo ability for independent floor transfers 12/28/21 MET  ?  ?    ? ?LONG TERM GOALS:  ? ?LTG Name Target Date Goal status  ?1 Patient will be independent with final HEP ? 02/01/22 Partially met   ?2 Pt to demo improved score on  BERG, by at least 8 points  ? 02/01/22 Partially met   ?3 Pt to demonstrate improved score on DGI  by at least 5 points  ? 02/01/22 Partially met   ?     ?4 Pt to demo ability for stair navigation, reciprocal pattern, with 1 UE support, and not pain, to improve ability for home and community navigation.  ? 02/01/22 ongoing  ?  ? ? ?PLAN: ?PT FREQUENCY: 2x/week ? ?PT DURATION: 8 weeks ? ?PLANNED INTERVENTIONS: Therapeutic exercises, Therapeutic activity, Neuro Muscular re-education, Balance training, Gait training, Patient/Family education, Joint mobilization, Stair training, DME instructions, Dry Needling, Electrical stimulation, Spinal mobilization, Cryotherapy, Moist heat, Taping, Vasopneumatic device, Traction, Ionotophoresis  71m/ml Dexamethasone, and Manual therapy ? ?PLAN FOR NEXT SESSION:  ? ? ?LLyndee Hensen PT, DPT ?12:15 PM  01/31/22 ? ? ?

## 2022-02-02 ENCOUNTER — Ambulatory Visit (INDEPENDENT_AMBULATORY_CARE_PROVIDER_SITE_OTHER): Payer: Medicare Other | Admitting: Physical Therapy

## 2022-02-02 ENCOUNTER — Encounter: Payer: Self-pay | Admitting: Physical Therapy

## 2022-02-02 DIAGNOSIS — R2689 Other abnormalities of gait and mobility: Secondary | ICD-10-CM | POA: Diagnosis not present

## 2022-02-02 DIAGNOSIS — M6281 Muscle weakness (generalized): Secondary | ICD-10-CM

## 2022-02-02 NOTE — Therapy (Signed)
?OUTPATIENT PHYSICAL THERAPY TREATMENT/ RE-CERT ? ? ?Patient Name: Allen Robinson ?MRN: [id number] ?DOB:Feb 13, 1926, 88 y.o., male ?Today's Date: 02/02/2022 ? ? ? ? ? PT End of Session - 02/02/22 1200   ? ? Visit Number 14   ? Number of Visits 20   ? Date for PT Re-Evaluation 03/02/22   ? Authorization Type Medicare  PN done at visit 10,  re-cert done at visit 14.   ? PT Start Time 1020   ? PT Stop Time 1100   ? PT Time Calculation (min) 40 min   ? Equipment Utilized During Treatment Gait belt   ? Activity Tolerance Patient tolerated treatment well   ? Behavior During Therapy [name] for tasks assessed/performed   ? ?  ?  ? ?  ? ? ? ? ? ? ? ? ? ? ? ? ?Past Medical History:  ?Diagnosis Date  ? Arthritis   ? hands  ? Chicken pox   ? Hyperlipidemia   ? Pneumonia   ? Seasonal allergies   ? ?Past Surgical History:  ?Procedure Laterality Date  ? TONSILLECTOMY    ? 1936  ? ?Patient Active Problem List  ? Diagnosis Date Noted  ? Senile purpura ([place]) 05/03/2020  ? Aortic atherosclerosis ([place]) 05/21/2019  ? Right rib fracture 05/15/2019  ? Hyperglycemia 04/28/2019  ? Gout 03/14/2017  ? Vitamin D deficiency 03/14/2017  ? BPH associated with nocturia 03/14/2017  ? Hyperlipidemia 03/14/2017  ? GERD (gastroesophageal reflux disease) 03/14/2017  ? Peripheral polyneuropathy 02/16/2016  ? Diplopia 11/19/2015  ? ? ?PCP: Marin Olp, MD ? ?REFERRING PROVIDER: Marin Olp, MD ? ?REFERRING DIAG: Gait/balance  ? ?THERAPY DIAG:  ?Other abnormalities of gait and mobility ? ?Muscle weakness (generalized) ? ? ?SUBJECTIVE:  ? ?SUBJECTIVE STATEMENT: ?Pt with no new complaints.  ? ?PERTINENT HISTORY: none  ? ?PAIN:  ?Are you having pain? No ? ? ?PRECAUTIONS: Fall  ? ?WEIGHT BEARING RESTRICTIONS No ? ?FALLS:  ?Has patient fallen in last 6 months? Yes, Number of falls: 6 ?Pt reluctant to leave home: no,  Has had decreased activity: no  ? ?OCCUPATION: Retired  ? ?PLOF: Independent ? ?PATIENT GOALS  Decreased falls, improved balance.   ? ? ?OBJECTIVE: updated 02/02/22 ? ?COGNITION: ? Overall cognitive status: Within functional limits for tasks assessed   ? ? ?LE AROM/PROM: ? LEs: WFL, mild limitation for DF bilaterally  ? ?LE MMT: ? Hips: +/5, Knees: 4+/5  ? ? ? [facility] PT Assessment - 02/02/22 0001   ? ?  ? Dynamic Gait Index  ? Level Surface Normal   ? Change in Gait Speed Mild Impairment   ? Gait with Horizontal Head Turns Mild Impairment   ? Gait with Vertical Head Turns Mild Impairment   ? Gait and Pivot Turn Mild Impairment   ? Step Over Obstacle Mild Impairment   ? Step Around Obstacles Mild Impairment   ? Steps Mild Impairment   ? Total Score 17   ? ?  ?  ? ?  ? ? ? ? ? ? ? ? ?TODAY'S TREATMENT: ? ?02/02/22: ?Therapeutic Exercise:   ?Aerobic: Recumbent bike x 7 min L 2 ;  ?Standing:  sit to stand x10 low mat table  ?Neuromuscular Re-education:   AirEx: A/P weight shifts x 25 ea;  Toe taps on 6 in step x 20 with cane; Static standing with 1 foot in step 30 sec; with head turns x 20 bil; with UE rot 4lb x 10 bil; Fwd  step ups 6 in, x 10 bil with cane;  Stairs up/down with 1 HR x 5;  ? ? ? ?PATIENT EDUCATION:  ?Education details:  reviewed safety on stairs and curbs ?Person educated: Patient ?Education method: Explanation ?Education comprehension: verbalized understanding, returned demonstration, and needs further education ? ? ?HOME EXERCISE PROGRAM: ?[id number] ? ? ?ASSESSMENT: ? ?CLINICAL IMPRESSION: ?6/43/83: Re-Cert  ?Pt has been seen for 14 visits. He is doing well in sessions, but does have decreased stability and balance with dynamic activity. He also has decreased confidence with balance, curbs and stairs, and has had several falls in the past few months. He remains a fall risk. Ongoing education and discussion about safety and needing to have AD with him at all times. Last 2 falls were due to him not using AD. Pt progressing with balance exercises, but does have decreased stability with unstable surfaces, posterior weight shift, and on  stairs. Pt to benefit from continued care.  ? ?Objective impairments include Abnormal gait, decreased activity tolerance, decreased balance, decreased coordination, decreased knowledge of use of DME, decreased mobility, difficulty walking, decreased strength, impaired flexibility, and improper body mechanics. These impairments are limiting patient from cleaning, community activity, yard work, and shopping. Personal factors including Age are also affecting patient's functional outcome. Patient will benefit from skilled PT to address above impairments and improve overall function. ? ?REHAB POTENTIAL: Fair  ? ?CLINICAL DECISION MAKING: Stable/uncomplicated ? ?EVALUATION COMPLEXITY: Low ? ? ?GOALS: ?Goals reviewed with patient? Yes ? ?SHORT TERM GOALS: ? ?STG Name Target Date Goal status  ?1 Patient will be independent with initial HEP  ? 12/21/21 MET  ?2 Pt to demo ability for independent floor transfers 12/28/21 MET  ?  ?    ? ?LONG TERM GOALS:  ? ?LTG Name Target Date Goal status  ?1 Patient will be independent with final HEP ? 03/02/22 Partially met   ?2 Pt to demo improved score on  BERG, by at least 8 points  ? 03/02/22 Partially met   ?3 Pt to demonstrate improved score on DGI  by at least 5 points  ? 03/02/22 Partially met   ?     ?4 Pt to demo ability for stair navigation, reciprocal pattern, with 1 UE support, and not pain, to improve ability for home and community navigation.  ? 03/02/22 Partially met   ?  ? ? ?PLAN: ?PT FREQUENCY: 2x/week ? ?PT DURATION: 4 weeks ? ?PLANNED INTERVENTIONS: Therapeutic exercises, Therapeutic activity, Neuro Muscular re-education, Balance training, Gait training, Patient/Family education, Joint mobilization, Stair training, DME instructions, Dry Needling, Electrical stimulation, Spinal mobilization, Cryotherapy, Moist heat, Taping, Vasopneumatic device, Traction, Ionotophoresis 71m/ml Dexamethasone, and Manual therapy ? ?PLAN FOR NEXT SESSION:  ? ? ?LLyndee Hensen PT, DPT ?12:02 PM   02/02/22 ? ? ?

## 2022-02-07 ENCOUNTER — Encounter: Payer: Medicare Other | Admitting: Physical Therapy

## 2022-02-09 ENCOUNTER — Encounter: Payer: Medicare Other | Admitting: Physical Therapy

## 2022-02-14 ENCOUNTER — Encounter: Payer: Medicare Other | Admitting: Physical Therapy

## 2022-02-14 LAB — LIPID PANEL
Cholesterol: 153 (ref 0–200)
HDL: 47 (ref 35–70)
Triglycerides: 106 (ref 40–160)

## 2022-02-14 LAB — BASIC METABOLIC PANEL
BUN: 22 — AB (ref 4–21)
CO2: 28 — AB (ref 13–22)
Chloride: 107 (ref 99–108)
Creatinine: 1.1 (ref 0.6–1.3)
Glucose: 108
Potassium: 4.7 mEq/L (ref 3.5–5.1)
Sodium: 143 (ref 137–147)

## 2022-02-14 LAB — MICROALBUMIN / CREATININE URINE RATIO: Microalb Creat Ratio: 4.9

## 2022-02-14 LAB — CBC AND DIFFERENTIAL
HCT: 43 (ref 41–53)
Hemoglobin: 14.2 (ref 13.5–17.5)
WBC: 5.2

## 2022-02-14 LAB — HEPATIC FUNCTION PANEL
ALT: 29 U/L (ref 10–40)
AST: 27 (ref 14–40)
Alkaline Phosphatase: 103 (ref 25–125)
Bilirubin, Direct: 0.2 (ref 0.01–0.4)
Bilirubin, Total: 0.6

## 2022-02-14 LAB — CBC: RBC: 4.6 (ref 3.87–5.11)

## 2022-02-14 LAB — COMPREHENSIVE METABOLIC PANEL: Albumin: 3.9 (ref 3.5–5.0)

## 2022-02-14 LAB — HEMOGLOBIN A1C: Hemoglobin A1C: 6

## 2022-02-16 ENCOUNTER — Ambulatory Visit (INDEPENDENT_AMBULATORY_CARE_PROVIDER_SITE_OTHER): Payer: Medicare Other | Admitting: Physical Therapy

## 2022-02-16 ENCOUNTER — Encounter: Payer: Self-pay | Admitting: Physical Therapy

## 2022-02-16 ENCOUNTER — Encounter: Payer: Medicare Other | Admitting: Physical Therapy

## 2022-02-16 DIAGNOSIS — R2689 Other abnormalities of gait and mobility: Secondary | ICD-10-CM

## 2022-02-16 DIAGNOSIS — M6281 Muscle weakness (generalized): Secondary | ICD-10-CM

## 2022-02-16 NOTE — Therapy (Signed)
?OUTPATIENT PHYSICAL THERAPY TREATMENT/ RE-CERT ? ? ?Patient Name: Allen Robinson [facility] ?MRN: [id number] ?DOB:10/09/1926, 88 y.o., male ?Today's Date: 02/16/2022 ? ? ? ? ? PT End of Session - 02/16/22 1032   ? ? Visit Number 15   ? Number of Visits 20   ? Date for PT Re-Evaluation 03/02/22   ? Authorization Type Medicare  PN done at visit 10,  re-cert done at visit 14.   ? PT Start Time 1021   ? PT Stop Time 1100   ? PT Time Calculation (min) 39 min   ? Equipment Utilized During Treatment Gait belt   ? Activity Tolerance Patient tolerated treatment well   ? Behavior During Therapy [facility] for tasks assessed/performed   ? ?  ?  ? ?  ? ? ? ? ? ? ? ? ? ? ? ? ? ?Past Medical History:  ?Diagnosis Date  ? Arthritis   ? hands  ? Chicken pox   ? Hyperlipidemia   ? Pneumonia   ? Seasonal allergies   ? ?Past Surgical History:  ?Procedure Laterality Date  ? TONSILLECTOMY    ? 1936  ? ?Patient Active Problem List  ? Diagnosis Date Noted  ? Senile purpura ([place]) 05/03/2020  ? Aortic atherosclerosis ([place]) 05/21/2019  ? Right rib fracture 05/15/2019  ? Hyperglycemia 04/28/2019  ? Gout 03/14/2017  ? Vitamin D deficiency 03/14/2017  ? BPH associated with nocturia 03/14/2017  ? Hyperlipidemia 03/14/2017  ? GERD (gastroesophageal reflux disease) 03/14/2017  ? Peripheral polyneuropathy 02/16/2016  ? Diplopia 11/19/2015  ? ? ?PCP: Marin Olp, MD ? ?REFERRING PROVIDER: Marin Olp, MD ? ?REFERRING DIAG: Gait/balance  ? ?THERAPY DIAG:  ?Other abnormalities of gait and mobility ? ?Muscle weakness (generalized) ? ? ?SUBJECTIVE:  ? ?SUBJECTIVE STATEMENT: ?Pt with no new complaints.  ? ?PERTINENT HISTORY: none  ? ?PAIN:  ?Are you having pain? No ? ? ?PRECAUTIONS: Fall  ? ?WEIGHT BEARING RESTRICTIONS No ? ?FALLS:  ?Has patient fallen in last 6 months? Yes, Number of falls: 6 ?Pt reluctant to leave home: no,  Has had decreased activity: no  ? ?OCCUPATION: Retired  ? ?PLOF: Independent ? ?PATIENT GOALS  Decreased falls, improved balance.   ? ? ?OBJECTIVE: updated 02/02/22 ? ?COGNITION: ? Overall cognitive status: Within functional limits for tasks assessed   ? ? ?LE AROM/PROM: ? LEs: WFL, mild limitation for DF bilaterally  ? ?LE MMT: ? Hips: +/5, Knees: 4+/5  ? ? ? ? ?TODAY'S TREATMENT: ? ?02/16/22: ?Therapeutic Exercise:   ?Aerobic:  ?Standing:  sit to stand x10 low mat table;  ?Marching x 20, HR x 20;  Gastroc stretch 30 sec x5 bil; Stairs up/down with SPC and 1 HR x 7 ; ?Neuromuscular Re-education:  Toe taps on 6 in step x 20 with cane; Static standing with 1 foot in step 30 sec; with head turns x 20 bil; with UE flex x 10 bil;  Tandem stance 30 sec x 3 bil;   ? ? ?02/02/22: ?Therapeutic Exercise:   ?Aerobic: Recumbent bike x 7 min L 2 ;  ?Standing:  sit to stand x10 low mat table  ?Neuromuscular Re-education:   AirEx: A/P weight shifts x 25 ea;  Toe taps on 6 in step x 20 with cane; Static standing with 1 foot in step 30 sec; with head turns x 20 bil; with UE rot 4lb x 10 bil; Fwd step ups 6 in, x 10 bil with cane;  Stairs up/down with 1 HR x  5;  ? ? ? ?PATIENT EDUCATION:  ?Education details:  reviewed and updated HEP ?Person educated: Patient ?Education method: Explanation ?Education comprehension: verbalized understanding, returned demonstration, and needs further education ? ? ?HOME EXERCISE PROGRAM: ?[id number] ? ? ?ASSESSMENT: ? ?CLINICAL IMPRESSION: ?02/16/22: ?Pt progressing well. He missed last few visits due to moving. Reviewed final HEP today, new handout given. Reviewed sequencing on stairs. Pt reports decreased confidence on stairs, but does very well with 1 Hand support on railing, and is safe to perform. Pt doing quite well at this time, likely d/c next visit.  ? ?Objective impairments include Abnormal gait, decreased activity tolerance, decreased balance, decreased coordination, decreased knowledge of use of DME, decreased mobility, difficulty walking, decreased strength, impaired flexibility, and improper body mechanics. These  impairments are limiting patient from cleaning, community activity, yard work, and shopping. Personal factors including Age are also affecting patient's functional outcome. Patient will benefit from skilled PT to address above impairments and improve overall function. ? ?REHAB POTENTIAL: Fair  ? ?CLINICAL DECISION MAKING: Stable/uncomplicated ? ?EVALUATION COMPLEXITY: Low ? ? ?GOALS: ?Goals reviewed with patient? Yes ? ?SHORT TERM GOALS: ? ?STG Name Target Date Goal status  ?1 Patient will be independent with initial HEP  ? 12/21/21 MET  ?2 Pt to demo ability for independent floor transfers 12/28/21 MET  ?  ?    ? ?LONG TERM GOALS:  ? ?LTG Name Target Date Goal status  ?1 Patient will be independent with final HEP ? 03/02/22 Partially met   ?2 Pt to demo improved score on  BERG, by at least 8 points  ? 03/02/22 Partially met   ?3 Pt to demonstrate improved score on DGI  by at least 5 points  ? 03/02/22 Partially met   ?     ?4 Pt to demo ability for stair navigation, reciprocal pattern, with 1 UE support, and not pain, to improve ability for home and community navigation.  ? 03/02/22 Partially met   ?  ? ? ?PLAN: ?PT FREQUENCY: 2x/week ? ?PT DURATION: 4 weeks ? ?PLANNED INTERVENTIONS: Therapeutic exercises, Therapeutic activity, Neuro Muscular re-education, Balance training, Gait training, Patient/Family education, Joint mobilization, Stair training, DME instructions, Dry Needling, Electrical stimulation, Spinal mobilization, Cryotherapy, Moist heat, Taping, Vasopneumatic device, Traction, Ionotophoresis 38m/ml Dexamethasone, and Manual therapy ? ?PLAN FOR NEXT SESSION:  ? ? ?LLyndee Hensen PT, DPT ?10:38 AM  02/16/22 ? ? ?

## 2022-02-23 ENCOUNTER — Encounter: Payer: Self-pay | Admitting: Physical Therapy

## 2022-02-23 ENCOUNTER — Ambulatory Visit (INDEPENDENT_AMBULATORY_CARE_PROVIDER_SITE_OTHER): Payer: Medicare Other | Admitting: Physical Therapy

## 2022-02-23 DIAGNOSIS — R2689 Other abnormalities of gait and mobility: Secondary | ICD-10-CM | POA: Diagnosis not present

## 2022-02-23 DIAGNOSIS — M6281 Muscle weakness (generalized): Secondary | ICD-10-CM | POA: Diagnosis not present

## 2022-02-23 NOTE — Therapy (Signed)
?OUTPATIENT PHYSICAL THERAPY TREATMENT ? ? ?Patient Name: Allen Robinson [facility] ?MRN: [id number] ?DOB:1926-05-14, 88 y.o., male ?Today's Date: 02/23/2022 ? ? ? ? ? PT End of Session - 02/28/22 1107   ? ? Visit Number 16   ? Number of Visits 20   ? Date for PT Re-Evaluation 03/02/22   ? Authorization Type Medicare  PN done at visit 10,  re-cert done at visit 14.   ? PT Start Time 1018   ? PT Stop Time 1100   ? PT Time Calculation (min) 42 min   ? Equipment Utilized During Treatment Gait belt   ? Activity Tolerance Patient tolerated treatment well   ? Behavior During Therapy [facility] for tasks assessed/performed   ? ?  ?  ? ?  ? ? ? ? ? ? ? ? ? ? ? ? ? ? ?Past Medical History:  ?Diagnosis Date  ? Arthritis   ? hands  ? Chicken pox   ? Hyperlipidemia   ? Pneumonia   ? Seasonal allergies   ? ?Past Surgical History:  ?Procedure Laterality Date  ? TONSILLECTOMY    ? 1936  ? ?Patient Active Problem List  ? Diagnosis Date Noted  ? Senile purpura ([place]) 05/03/2020  ? Aortic atherosclerosis ([place]) 05/21/2019  ? Right rib fracture 05/15/2019  ? Hyperglycemia 04/28/2019  ? Gout 03/14/2017  ? Vitamin D deficiency 03/14/2017  ? BPH associated with nocturia 03/14/2017  ? Hyperlipidemia 03/14/2017  ? GERD (gastroesophageal reflux disease) 03/14/2017  ? Peripheral polyneuropathy 02/16/2016  ? Diplopia 11/19/2015  ? ? ?PCP: Marin Olp, MD ? ?REFERRING PROVIDER: Marin Olp, MD ? ?REFERRING DIAG: Gait/balance  ? ?THERAPY DIAG:  ?Other abnormalities of gait and mobility ? ?Muscle weakness (generalized) ? ? ?SUBJECTIVE:  ? ?SUBJECTIVE STATEMENT: ?Pt with no new complaints.  ? ?PERTINENT HISTORY: none  ? ?PAIN:  ?Are you having pain? No ? ? ?PRECAUTIONS: Fall  ? ?WEIGHT BEARING RESTRICTIONS No ? ?FALLS:  ?Has patient fallen in last 6 months? Yes, Number of falls: 6 ?Pt reluctant to leave home: no,  Has had decreased activity: no  ? ?OCCUPATION: Retired  ? ?PLOF: Independent ? ?PATIENT GOALS  Decreased falls, improved balance.   ? ? ?OBJECTIVE: updated 02/02/22 ? ?COGNITION: ? Overall cognitive status: Within functional limits for tasks assessed   ? ? ?LE AROM/PROM: ? LEs: WFL, mild limitation for DF bilaterally  ? ?LE MMT: ? Hips: +/5, Knees: 4+/5  ? ? ? ? [facility] PT Assessment - 02/28/22 0001   ? ?  ? Berg Balance Test  ? Sit to Stand Able to stand without using hands and stabilize independently   ? Standing Unsupported Able to stand safely 2 minutes   ? Sitting with Back Unsupported but Feet Supported on Floor or Stool Able to sit safely and securely 2 minutes   ? Stand to Sit Sits safely with minimal use of hands   ? Transfers Able to transfer safely, minor use of hands   ? Standing Unsupported with Eyes Closed Able to stand 10 seconds safely   ? Standing Unsupported with Feet Together Able to place feet together independently and stand 1 minute safely   ? From Standing, Reach Forward with Outstretched Arm Can reach confidently >25 cm (10")   ? From Standing Position, Pick up Object from [place] to pick up shoe safely and easily   ? From Standing Position, Turn to Look Behind Over each Shoulder Looks behind one side only/other side shows  less weight shift   ? Turn 360 Degrees Able to turn 360 degrees safely in 4 seconds or less   ? Standing Unsupported, Alternately Place Feet on Step/Stool Able to stand independently and safely and complete 8 steps in 20 seconds   ? Standing Unsupported, One Foot in Front Able to take small step independently and hold 30 seconds   ? Standing on One Leg Tries to lift leg/unable to hold 3 seconds but remains standing independently   ? Total Score 50   ?  ? Dynamic Gait Index  ? Level Surface Normal   ? Change in Gait Speed Mild Impairment   ? Gait with Horizontal Head Turns Mild Impairment   ? Gait with Vertical Head Turns Mild Impairment   ? Gait and Pivot Turn Mild Impairment   ? Step Over Obstacle Mild Impairment   ? Step Around Obstacles Mild Impairment   ? Steps Mild Impairment   ? Total Score 17   ?   ? Timed Up and Go Test  ? TUG Comments 12.5  with SPC.   ? ?  ?  ? ?  ? ? ? ? ? ? ?TODAY'S TREATMENT: ? ?02/23/22 ?Therapeutic Exercise:   ?Aerobic:  ?Standing:  sit to stand x10 low mat table;  ?Marching x 20, HR x 20;  Gastroc stretch 30 sec x5 bil; Stairs up/down with SPC and 1 HR x 7 ; ?Neuromuscular Re-education:  Toe taps on 6 in step x 20 with cane; Static standing with 1 foot in step 30 sec; with head turns x 20 bil; with UE flex x 10 bil;  Tandem stance 30 sec x 3 bil;   ?Self care: reviewed final HEP in detail.  ? ? ?PATIENT EDUCATION:  ?Education details:  reviewed and updated final HEP ?Person educated: Patient ?Education method: Explanation ?Education comprehension: verbalized understanding, returned demonstration, and needs further education ? ? ?HOME EXERCISE PROGRAM: ?[id number] ? ? ?ASSESSMENT: ? ?CLINICAL IMPRESSION: ?02/23/22:  ?Pt with improved scores on all balance tests. DGI, BERG and TUG all retested today. Pt doing well at this time. He has improved safety and awareness for functional activity, gait, and stair/curbs. He has mild balance deficits and some decreased confidence with higher level activities, but has shown ability to safely perform in sessions. Overall is safe for regular community activities, will require continued use of SPC. Discussed importance of decision making for safety, with stairs and curbs, and need for AD or UE support when able. Pt ready for d/c to HEP at this time. Final HEP reviewed.  ? ? ? ?Objective impairments include Abnormal gait, decreased activity tolerance, decreased balance, decreased coordination, decreased knowledge of use of DME, decreased mobility, difficulty walking, decreased strength, impaired flexibility, and improper body mechanics. These impairments are limiting patient from cleaning, community activity, yard work, and shopping. Personal factors including Age are also affecting patient's functional outcome. Patient will benefit from skilled PT to  address above impairments and improve overall function. ? ?REHAB POTENTIAL: Fair  ? ?CLINICAL DECISION MAKING: Stable/uncomplicated ? ?EVALUATION COMPLEXITY: Low ? ? ?GOALS: ?Goals reviewed with patient? Yes ? ?SHORT TERM GOALS: ? ?STG Name Target Date Goal status  ?1 Patient will be independent with initial HEP  ? 12/21/21 MET  ?2 Pt to demo ability for independent floor transfers 12/28/21 MET  ?  ?    ? ?LONG TERM GOALS:  ? ?LTG Name Target Date Goal status  ?1 Patient will be independent with final HEP ? 03/02/22 MET  ?  2 Pt to demo improved score on  BERG, by at least 8 points  ? 03/02/22 Partially met   ?3 Pt to demonstrate improved score on DGI  by at least 5 points  ? 03/02/22 Partially met   ?     ?4 Pt to demo ability for stair navigation, reciprocal pattern, with 1 UE support, and not pain, to improve ability for home and community navigation.  ? 03/02/22 MET  ?  ? ? ?PLAN: ?PT FREQUENCY: 2x/week ? ?PT DURATION: 4 weeks ? ?PLANNED INTERVENTIONS: Therapeutic exercises, Therapeutic activity, Neuro Muscular re-education, Balance training, Gait training, Patient/Family education, Joint mobilization, Stair training, DME instructions, Dry Needling, Electrical stimulation, Spinal mobilization, Cryotherapy, Moist heat, Taping, Vasopneumatic device, Traction, Ionotophoresis 53m/ml Dexamethasone, and Manual therapy ? ?PLAN FOR NEXT SESSION:  ? ? ?LLyndee Hensen PT, DPT ?11:07 AM  02/28/22 ? ? ? ?PHYSICAL THERAPY DISCHARGE SUMMARY ? ?Visits from Start of Care: 16 ? ?Plan: ?Patient agrees to discharge.  Patient goals were met. Patient is being discharged due to meeting the stated rehab goals.    ? ?LLyndee Hensen PT, DPT ?11:20 AM  02/28/22 ? ? ?

## 2022-02-28 ENCOUNTER — Encounter: Payer: Self-pay | Admitting: Physical Therapy

## 2022-04-26 ENCOUNTER — Telehealth: Payer: Self-pay

## 2022-04-26 NOTE — Chronic Care Management (AMB) (Signed)
  Chronic Care Management   Note  04/26/2022 Name: Kenyada Hy MRN: [id number] DOB: [birth year]  Sakai Wolford is a 88 y.o. year old male who is a primary care patient of Marin Olp, MD. Avi Kerschner Boule is currently enrolled in care management services. An additional referral for RNCM was placed.   Follow up plan: Patient declines RNCM engagement by the care management team. Appropriate care team members and provider have been notified via electronic communication.   Noreene Larsson, [place], [place], [place] [address] Direct Dial: [phone] Daune [email or web address] Website: [email or web address]

## 2022-05-05 ENCOUNTER — Telehealth: Payer: Self-pay | Admitting: Family Medicine

## 2022-05-05 ENCOUNTER — Ambulatory Visit: Payer: Medicare Other | Admitting: Family Medicine

## 2022-05-05 NOTE — Telephone Encounter (Signed)
Patient walked in at 10 am for a 920 apt - stated he received a message stating his apt was at 1020am -   Dr hunter was willing to work him but there would be a wait time as there are patients before him. Patient refused to wait. Patient was offered the next avl apt - patient stated he has things to do and that he will call us to sch next apt -

## 2022-06-05 ENCOUNTER — Encounter: Payer: Self-pay | Admitting: Family Medicine

## 2022-06-05 ENCOUNTER — Ambulatory Visit (INDEPENDENT_AMBULATORY_CARE_PROVIDER_SITE_OTHER): Payer: Medicare Other | Admitting: Family Medicine

## 2022-06-05 VITALS — BP 112/76 | HR 63 | Temp 97.6°F | Ht 70.0 in | Wt 167.8 lb

## 2022-06-05 DIAGNOSIS — E559 Vitamin D deficiency, unspecified: Secondary | ICD-10-CM

## 2022-06-05 DIAGNOSIS — M1A0790 Idiopathic chronic gout, unspecified ankle and foot, without tophus (tophi): Secondary | ICD-10-CM | POA: Diagnosis not present

## 2022-06-05 DIAGNOSIS — I700 Atherosclerosis of aorta: Secondary | ICD-10-CM | POA: Diagnosis not present

## 2022-06-05 DIAGNOSIS — D692 Other nonthrombocytopenic purpura: Secondary | ICD-10-CM | POA: Diagnosis not present

## 2022-06-05 DIAGNOSIS — E785 Hyperlipidemia, unspecified: Secondary | ICD-10-CM | POA: Diagnosis not present

## 2022-06-05 DIAGNOSIS — R351 Nocturia: Secondary | ICD-10-CM | POA: Diagnosis not present

## 2022-06-05 DIAGNOSIS — N401 Enlarged prostate with lower urinary tract symptoms: Secondary | ICD-10-CM

## 2022-06-05 NOTE — Patient Instructions (Addendum)
No labs today- we will consider next visit  Consider new covid shot when comes out in the fall hopefully around time of flu shot (maybe October) - let us know when gets through [place]  Recommended follow up: Return in about 6 months (around 12/06/2022) for followup or sooner if needed.Schedule b4 you leave.

## 2022-06-05 NOTE — Progress Notes (Signed)
Phone [phone] In person visit   Subjective:   Allen Robinson is a 88 y.o. year old very pleasant male patient who presents for/with See problem oriented charting Chief Complaint  Patient presents with   Follow-up   Hyperlipidemia   Past Medical History-  Patient Active Problem List   Diagnosis Date Noted   Aortic atherosclerosis ([place]) 05/21/2019    Priority: Medium    Gout 03/14/2017    Priority: Medium    BPH associated with nocturia 03/14/2017    Priority: Medium    Hyperlipidemia 03/14/2017    Priority: Medium    GERD (gastroesophageal reflux disease) 03/14/2017    Priority: Medium    Senile purpura ([place]) 05/03/2020    Priority: Low   Right rib fracture 05/15/2019    Priority: Low   Hyperglycemia 04/28/2019    Priority: Low   Vitamin D deficiency 03/14/2017    Priority: Low   Peripheral polyneuropathy 02/16/2016    Priority: Low   Diplopia 11/19/2015    Priority: Low    Medications- reviewed and updated Current Outpatient Medications  Medication Sig Dispense Refill   allopurinol (ZYLOPRIM) 100 MG tablet TAKE TWO TABLETS BY MOUTH DAILY TO PREVENT GOUT, NOT FOR ACUTE ATTACKS, MUST TAKE EVERY DAY     colchicine 0.6 MG tablet Take 0.6 mg by mouth as needed.     finasteride (PROSCAR) 5 MG tablet Take 1 tablet by mouth daily.     Multiple Vitamin (MULTIVITAMIN ADULT PO) Take by mouth.     omeprazole (PRILOSEC) 20 MG capsule Take 20 mg by mouth daily as needed.      simvastatin (ZOCOR) 40 MG tablet Take 20 mg by mouth at bedtime.      terazosin (HYTRIN) 2 MG capsule Take 2 mg by mouth at bedtime.     Vitamin D, Ergocalciferol, (DRISDOL) 1.25 MG (50000 UNIT) CAPS capsule Take 1 capsule (50,000 Units total) by mouth every 7 (seven) days. 12 capsule 0   No current facility-administered medications for this visit.     Objective:  BP 112/76   Pulse 63   Temp 97.6 F (36.4 C)   Ht '5\' 10"'$  (1.778 m)   Wt 167 lb 12.8 oz (76.1 kg)   SpO2 98%   BMI 24.08  kg/m  Gen: NAD, resting comfortably CV: RRR no murmurs rubs or gallops Lungs: CTAB no crackles, wheeze, rhonchi Ext: trace edema Skin: warm, dry Neuro: using cane- hard of hearing. Able to stand without difficulty    Assessment and Plan   #social update- moving to [place] in 2023- downsizing in 2023  # Balance Issues  S: proprioception issues with neuropathy contribute, cannot go up stairs safely without railings. He has a cane available for use-we have recommended consistent use- doing better now . Had been feeling more stamina issues compared to baseline. -[facility]-we also did a referral internally to Lyndee Hensen, DPT in 2023 and patient did 6 sessions- he did find this helpful - he has not had falls recently- and is using a cane more regularly after PT- did have falls prior to starting. He got someone to mow his lawn.  -[facility] also [facility] to [facility] for Parkinson's- sounds like DAT from June 21, 2018 A/P: slightly better after PT and using cane- encouraged to continue to monitor - refer back if needed - I did express concern about terazosin   # Hyperlipidemia/aortic atherosclerosis - incidental finding on prior imaging  with ideal LDL goal under 70 S:Medication - Simvastatin 40 mg Lab Results  Component Value Date   CHOL 153 02/14/2022   HDL 47 02/14/2022   LDLCALC 74 11/03/2021   TRIG 106 02/14/2022   CHOLHDL 3 11/03/2021   A/P: very close to ideal goal- with labs through [place]- continue current meds   #Gout-uric acid has been below 6 S: 0 flares in [place] 6 months on Allopurinol 100 mg tablet.  Has colchicine available if needed Lab Results  Component Value Date   LABURIC 4.3 11/03/2021  A/P: doing well without flares- continue current meds    # GERD S:Medication -  Omeprazole 20 mg  as needed A/P: doing well with sparing usine- continue current meds   #BPH S: Medication:- Finasteride 5 mg from the [place], also on Vesicare.  Most  recently started on terazosin and Vesicare was stopped -Nocturia - gets up 3x a night to urinate. A/P: he has not tried this change yet- warned about orthostatic risk- he plans to trial and will let us know if has issues- continue to use cane.    # Hyperglycemia/insulin resistance/prediabetes-peak recent A1c 6.3 in 2020 S: Exercise and diet - trying to remain active  Lab Results  Component Value Date   HGBA1C 6.0 02/14/2022  A/P: will plan on checking next visit- needs to be at least 6 months in between   #Vitamin D deficiency S: Medication: 10,000 units daily Last vitamin D Lab Results  Component Value Date   VD25OH 35.87 11/03/2021  A/P: Controlled. Continue current medications.  Repeat labs next visit   # Constipation-sparing MiraLAX if needed.  Colace if needed- still deals with   #senile purpura- noted. Stable. Check cbc at least annually  Recommended follow up: Return in about 6 months (around 12/06/2022) for followup or sooner if needed.Schedule b4 you leave. Future Appointments  Date Time Provider [place]  12/01/2022 10:15 AM [facility] HEALTH COACH [facility]   Lab/Order associations:   ICD-10-CM   1. Hyperlipidemia, unspecified hyperlipidemia type  E78.5     2. Chronic idiopathic gout involving toe without tophus, unspecified laterality  M1A.0790     3. Aortic atherosclerosis (HCC)  I70.0     4. BPH associated with nocturia  N40.1    R35.1     5. Vitamin D deficiency  E55.9     6. Senile purpura (HCC) Chronic D69.2       No orders of the defined types were placed in this encounter.   Return precautions advised.  Garret Reddish, MD

## 2022-07-17 DIAGNOSIS — Z961 Presence of intraocular lens: Secondary | ICD-10-CM | POA: Diagnosis not present

## 2022-07-18 DIAGNOSIS — D2239 Melanocytic nevi of other parts of face: Secondary | ICD-10-CM | POA: Diagnosis not present

## 2022-07-18 DIAGNOSIS — D225 Melanocytic nevi of trunk: Secondary | ICD-10-CM | POA: Diagnosis not present

## 2022-07-18 DIAGNOSIS — L570 Actinic keratosis: Secondary | ICD-10-CM | POA: Diagnosis not present

## 2022-07-18 DIAGNOSIS — D2262 Melanocytic nevi of left upper limb, including shoulder: Secondary | ICD-10-CM | POA: Diagnosis not present

## 2022-07-18 DIAGNOSIS — D2271 Melanocytic nevi of right lower limb, including hip: Secondary | ICD-10-CM | POA: Diagnosis not present

## 2022-07-18 DIAGNOSIS — Z85828 Personal history of other malignant neoplasm of skin: Secondary | ICD-10-CM | POA: Diagnosis not present

## 2022-07-18 DIAGNOSIS — C44319 Basal cell carcinoma of skin of other parts of face: Secondary | ICD-10-CM | POA: Diagnosis not present

## 2022-07-18 DIAGNOSIS — D485 Neoplasm of uncertain behavior of skin: Secondary | ICD-10-CM | POA: Diagnosis not present

## 2022-07-18 DIAGNOSIS — L821 Other seborrheic keratosis: Secondary | ICD-10-CM | POA: Diagnosis not present

## 2022-07-18 DIAGNOSIS — D2272 Melanocytic nevi of left lower limb, including hip: Secondary | ICD-10-CM | POA: Diagnosis not present

## 2022-07-18 DIAGNOSIS — L820 Inflamed seborrheic keratosis: Secondary | ICD-10-CM | POA: Diagnosis not present

## 2022-07-18 DIAGNOSIS — D2261 Melanocytic nevi of right upper limb, including shoulder: Secondary | ICD-10-CM | POA: Diagnosis not present

## 2022-08-14 ENCOUNTER — Encounter: Payer: Self-pay | Admitting: *Deleted

## 2022-08-17 DIAGNOSIS — Z23 Encounter for immunization: Secondary | ICD-10-CM | POA: Diagnosis not present

## 2022-08-31 DIAGNOSIS — Z23 Encounter for immunization: Secondary | ICD-10-CM | POA: Diagnosis not present

## 2022-11-02 ENCOUNTER — Encounter: Payer: Self-pay | Admitting: *Deleted

## 2022-12-01 ENCOUNTER — Ambulatory Visit (INDEPENDENT_AMBULATORY_CARE_PROVIDER_SITE_OTHER): Payer: Medicare Other

## 2022-12-01 VITALS — Wt 167.0 lb

## 2022-12-01 DIAGNOSIS — Z0000 Encounter for general adult medical examination without abnormal findings: Secondary | ICD-10-CM

## 2022-12-01 NOTE — Patient Instructions (Signed)
Allen Robinson , Thank you for taking time to come for your Medicare Wellness Visit. I appreciate your ongoing commitment to your health goals. Please review the following plan we discussed and let me know if I can assist you in the future.   These are the goals we discussed:  Goals      Manage My Medicine     Timeframe:  Long-Range Goal Priority:  High Start Date:  11/08/21                           Expected End Date: 05/09/22                       Follow Up Date 02/06/22    - call for medicine refill 2 or 3 days before it runs out - call if I am sick and can't take my medicine - keep a list of all the medicines I take; vitamins and herbals too    Why is this important?   These steps will help you keep on track with your medicines.   Notes:      patient     Stay Vertical  Stay active; exercise will help if strength building  Will take interval breaks working in the yard or doing something otherwise strenuous   May try tai chi         Patient Stated     To be as health next year Keep up the balance exercises      Patient Stated     Stay upright and not lose balance     Patient Stated     Live longer      Patient Stated     Stay healthy         This is a list of the screening recommended for you and due dates:  Health Maintenance  Topic Date Due   Zoster (Shingles) Vaccine (1 of 2) Never done   Flu Shot  06/20/2022   COVID-19 Vaccine (7 - 2023-24 season) 07/21/2022   Pneumonia Vaccine (2 - PPSV23 or PCV20) 06/06/2023*   Medicare Annual Wellness Visit  12/02/2023   DTaP/Tdap/Td vaccine (4 - Td or Tdap) 01/28/2031   HPV Vaccine  Aged Out  *Topic was postponed. The date shown is not the original due date.    Advanced directives: Please bring a copy of your health care power of attorney and living will to the office at your convenience.  Conditions/risks identified: stay healthy   Next appointment: Follow up in one year for your annual wellness visit.    Preventive Care 36 Years and Older, Male  Preventive care refers to lifestyle choices and visits with your health care provider that can promote health and wellness. What does preventive care include? A yearly physical exam. This is also called an annual well check. Dental exams once or twice a year. Routine eye exams. Ask your health care provider how often you should have your eyes checked. Personal lifestyle choices, including: Daily care of your teeth and gums. Regular physical activity. Eating a healthy diet. Avoiding tobacco and drug use. Limiting alcohol use. Practicing safe sex. Taking low doses of aspirin every day. Taking vitamin and mineral supplements as recommended by your health care provider. What happens during an annual well check? The services and screenings done by your health care provider during your annual well check will depend on your age, overall health, lifestyle risk factors, and  family history of disease. Counseling  Your health care provider may ask you questions about your: Alcohol use. Tobacco use. Drug use. Emotional well-being. Home and relationship well-being. Sexual activity. Eating habits. History of falls. Memory and ability to understand (cognition). Work and work Statistician. Screening  You may have the following tests or measurements: Height, weight, and BMI. Blood pressure. Lipid and cholesterol levels. These may be checked every 5 years, or more frequently if you are over 15 years old. Skin check. Lung cancer screening. You may have this screening every year starting at age 3 if you have a 30-pack-year history of smoking and currently smoke or have quit within the past 15 years. Fecal occult blood test (FOBT) of the stool. You may have this test every year starting at age 48. Flexible sigmoidoscopy or colonoscopy. You may have a sigmoidoscopy every 5 years or a colonoscopy every 10 years starting at age 45. Prostate cancer  screening. Recommendations will vary depending on your family history and other risks. Hepatitis C blood test. Hepatitis B blood test. Sexually transmitted disease (STD) testing. Diabetes screening. This is done by checking your blood sugar (glucose) after you have not eaten for a while (fasting). You may have this done every 1-3 years. Abdominal aortic aneurysm (AAA) screening. You may need this if you are a current or former smoker. Osteoporosis. You may be screened starting at age 69 if you are at high risk. Talk with your health care provider about your test results, treatment options, and if necessary, the need for more tests. Vaccines  Your health care provider may recommend certain vaccines, such as: Influenza vaccine. This is recommended every year. Tetanus, diphtheria, and acellular pertussis (Tdap, Td) vaccine. You may need a Td booster every 10 years. Zoster vaccine. You may need this after age 30. Pneumococcal 13-valent conjugate (PCV13) vaccine. One dose is recommended after age 36. Pneumococcal polysaccharide (PPSV23) vaccine. One dose is recommended after age 80. Talk to your health care provider about which screenings and vaccines you need and how often you need them. This information is not intended to replace advice given to you by your health care provider. Make sure you discuss any questions you have with your health care provider. Document Released: 12/03/2015 Document Revised: 07/26/2016 Document Reviewed: 09/07/2015 Elsevier Interactive Patient Education  2017 [employer] Prevention in the Home Falls can cause injuries. They can happen to people of all ages. There are many things you can do to make your home safe and to help prevent falls. What can I do on the outside of my home? Regularly fix the edges of walkways and driveways and fix any cracks. Remove anything that might make you trip as you walk through a door, such as a raised step or threshold. Trim any  bushes or trees on the path to your home. Use bright outdoor lighting. Clear any walking paths of anything that might make someone trip, such as rocks or tools. Regularly check to see if handrails are loose or broken. Make sure that both sides of any steps have handrails. Any raised decks and porches should have guardrails on the edges. Have any leaves, snow, or ice cleared regularly. Use sand or salt on walking paths during winter. Clean up any spills in your garage right away. This includes oil or grease spills. What can I do in the bathroom? Use night lights. Install grab bars by the toilet and in the tub and shower. Do not use towel bars as grab  bars. Use non-skid mats or decals in the tub or shower. If you need to sit down in the shower, use a plastic, non-slip stool. Keep the floor dry. Clean up any water that spills on the floor as soon as it happens. Remove soap buildup in the tub or shower regularly. Attach bath mats securely with double-sided non-slip rug tape. Do not have throw rugs and other things on the floor that can make you trip. What can I do in the bedroom? Use night lights. Make sure that you have a light by your bed that is easy to reach. Do not use any sheets or blankets that are too big for your bed. They should not hang down onto the floor. Have a firm chair that has side arms. You can use this for support while you get dressed. Do not have throw rugs and other things on the floor that can make you trip. What can I do in the kitchen? Clean up any spills right away. Avoid walking on wet floors. Keep items that you use a lot in easy-to-reach places. If you need to reach something above you, use a strong step stool that has a grab bar. Keep electrical cords out of the way. Do not use floor polish or wax that makes floors slippery. If you must use wax, use non-skid floor wax. Do not have throw rugs and other things on the floor that can make you trip. What can I do  with my stairs? Do not leave any items on the stairs. Make sure that there are handrails on both sides of the stairs and use them. Fix handrails that are broken or loose. Make sure that handrails are as long as the stairways. Check any carpeting to make sure that it is firmly attached to the stairs. Fix any carpet that is loose or worn. Avoid having throw rugs at the top or bottom of the stairs. If you do have throw rugs, attach them to the floor with carpet tape. Make sure that you have a light switch at the top of the stairs and the bottom of the stairs. If you do not have them, ask someone to add them for you. What else can I do to help prevent falls? Wear shoes that: Do not have high heels. Have rubber bottoms. Are comfortable and fit you well. Are closed at the toe. Do not wear sandals. If you use a stepladder: Make sure that it is fully opened. Do not climb a closed stepladder. Make sure that both sides of the stepladder are locked into place. Ask someone to hold it for you, if possible. Clearly mark and make sure that you can see: Any grab bars or handrails. First and last steps. Where the edge of each step is. Use tools that help you move around (mobility aids) if they are needed. These include: Canes. Walkers. Scooters. Crutches. Turn on the lights when you go into a dark area. Replace any light bulbs as soon as they burn out. Set up your furniture so you have a clear path. Avoid moving your furniture around. If any of your floors are uneven, fix them. If there are any pets around you, be aware of where they are. Review your medicines with your doctor. Some medicines can make you feel dizzy. This can increase your chance of falling. Ask your doctor what other things that you can do to help prevent falls. This information is not intended to replace advice given to you by your health  care provider. Make sure you discuss any questions you have with your health care  provider. Document Released: 09/02/2009 Document Revised: 04/13/2016 Document Reviewed: 12/11/2014 Elsevier Interactive Patient Education  2017 [employer].

## 2022-12-01 NOTE — Progress Notes (Signed)
I connected with  Allen Robinson on 12/01/22 by a audio enabled telemedicine application and verified that I am speaking with the correct person using two identifiers.  Patient Location: Home  Provider Location: Office/Clinic  I discussed the limitations of evaluation and management by telemedicine. The patient expressed understanding and agreed to proceed.   Subjective:   Allen Robinson is a 89 y.o. male who presents for Medicare Annual/Subsequent preventive examination.  Review of Systems     Cardiac Risk Factors include: advanced age (>49mn, >>26women);dyslipidemia;male gender     Objective:    Today's Vitals   12/01/22 1005  Weight: 167 lb (75.8 kg)   Body mass index is 23.96 kg/m.     12/01/2022   10:08 AM 12/11/2021    3:56 PM 11/25/2021   10:24 AM 11/22/2020    8:12 AM 11/10/2020   10:23 AM 11/05/2020    9:42 AM 10/28/2019   11:27 AM  Advanced Directives  Does Patient Have a Medical Advance Directive? Yes Yes Yes Yes Yes Yes Yes  Type of AParamedicof ACamargoLiving will Healthcare Power of [place]of [place]Living will;Out of facility DNR (pink MOST or yellow form) HC[place]hireLiving will;Out of facility DNR (pink MOST or yellow form) HG[place]woodLiving will Living will;Healthcare Power of Attorney  Does patient want to make changes to medical advance directive?  No - Patient declined  No - Patient declined   No - Patient declined  Copy of [place]in Chart? No - copy requested No - copy requested No - copy requested No - copy requested No - copy requested No - copy requested No - copy requested    Current Medications (verified) Outpatient Encounter Medications as of 12/01/2022  Medication Sig   allopurinol (ZYLOPRIM) 100 MG tablet TAKE TWO TABLETS BY MOUTH DAILY TO PREVENT GOUT, NOT FOR ACUTE ATTACKS, MUST TAKE EVERY DAY    colchicine 0.6 MG tablet Take 0.6 mg by mouth as needed.   finasteride (PROSCAR) 5 MG tablet Take 1 tablet by mouth daily.   Multiple Vitamin (MULTIVITAMIN ADULT PO) Take by mouth.   omeprazole (PRILOSEC) 20 MG capsule Take 20 mg by mouth daily as needed.    simvastatin (ZOCOR) 40 MG tablet Take 20 mg by mouth at bedtime.    terazosin (HYTRIN) 2 MG capsule Take 2 mg by mouth at bedtime.   Vitamin D, Ergocalciferol, (DRISDOL) 1.25 MG (50000 UNIT) CAPS capsule Take 1 capsule (50,000 Units total) by mouth every 7 (seven) days.   No facility-administered encounter medications on file as of 12/01/2022.    Allergies (verified) Patient has no known allergies.   History: Past Medical History:  Diagnosis Date   Arthritis    hands   Chicken pox    Hyperlipidemia    Pneumonia    Seasonal allergies    Past Surgical History:  Procedure Laterality Date   TONSILLECTOMY     1936   Family History  Problem Relation Age of Onset   Heart failure Mother    Pneumonia Mother        died 942  Stroke Father        848  Neuropathy Neg Hx    Social History   Socioeconomic History   Marital status: Married    Spouse name: Allen Robinson   Number of children: 2   Years of education: 16   Highest education level: Not  on file  Occupational History   Not on file  Tobacco Use   Smoking status: Never   Smokeless tobacco: Never  Substance and Sexual Activity   Alcohol use: Yes    Alcohol/week: 0.0 standard drinks of alcohol    Comment: no longer drinking. prior social   Drug use: No   Sexual activity: Not on file  Other Topics Concern   Not on file  Social History Narrative   Married. Lives with wife. 2 children- daughter Freight forwarder (2 daughters one will be a Restaurant manager, fast food), daughter Lodema Hong and never came back home- still at [employer] (1 grandson) so 3 grandkids total.        Fought in [place] war- wife was also in the war and veteran.       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     Social Determinants of Health   Financial Resource Strain: Low Risk  (12/01/2022)   Overall Financial Resource Strain (CARDIA)    Difficulty of Paying Living Expenses: Not hard at all  Food Insecurity: No Food Insecurity (12/01/2022)   Hunger Vital Sign    Worried About Running Out of Food in the Last Year: Never true    Ran Out of Food in the Last Year: Never true  Transportation Needs: No Transportation Needs (12/01/2022)   PRAPARE - Hydrologist (Medical): No    Lack of Transportation (Non-Medical): No  Physical Activity: Insufficiently Active (12/01/2022)   Exercise Vital Sign    Days of Exercise per Week: 3 days    Minutes of Exercise per Session: 30 min  Stress: No Stress Concern Present (12/01/2022)   [place]    Feeling of Stress : Not at all  Social Connections: Moderately Integrated (12/01/2022)   Social Connection and Isolation Panel [NHANES]    Frequency of Communication with Friends and Family: More than three times a week    Frequency of Social Gatherings with Friends and Family: More than three times a week    Attends Religious Services: More than 4 times per year    Active Member of [employer] or Organizations: No    Attends Music therapist: Never    Marital Status: Married    Tobacco Counseling Counseling given: Not Answered   Clinical Intake:  Pre-visit preparation completed: Yes  Pain : No/denies pain     BMI - recorded: 23.96 Nutritional Status: BMI of 19-24  Normal Nutritional Risks: None Diabetes: No  How often do you need to have someone help you when you read instructions, pamphlets, or other written materials from your doctor or pharmacy?: 1 - Never  Diabetic?no  Interpreter Needed?: No  Information entered by :: Charlott Rakes, LPN   Activities of Daily Living    12/01/2022   10:09 AM  In your present state of health, do you have  any difficulty performing the following activities:  Hearing? 1  Comment pt wears hearing aids  Vision? 0  Difficulty concentrating or making decisions? 0  Walking or climbing stairs? 1  Comment at times  Dressing or bathing? 0  Doing errands, shopping? 0  Preparing Food and eating ? N  Using the Toilet? N  In the past six months, have you accidently leaked urine? Y  Comment at times  Do you have problems with loss of bowel control? N  Managing your Medications? N  Managing your Finances? N  Housekeeping  or managing your Housekeeping? N    Patient Care Team: Marin Olp, MD as PCP - General (Family Medicine) Clinic, [facility] as Consulting Physician Jarome Matin, MD as Consulting Physician (Dermatology) Edythe Clarity, [facility] (Pharmacist)  Indicate any recent Medical Services you may have received from other than [facility] providers in the past year (date may be approximate).     Assessment:   This is a routine wellness examination for Allen Robinson.  Hearing/Vision screen Hearing Screening - Comments:: Pt wears a hearing aids  Vision Screening - Comments:: Pt follows up with Dr Delman Cheadle for annual eye exams   Dietary issues and exercise activities discussed: Current Exercise Habits: Home exercise routine, Type of exercise: walking, Time (Minutes): 30, Frequency (Times/Week): 3, Weekly Exercise (Minutes/Week): 90   Goals Addressed             This Visit's Progress    Patient Stated       Stay healthy        Depression Screen    12/01/2022   10:07 AM 11/25/2021   10:18 AM 04/27/2021    9:49 AM 11/05/2020    9:39 AM 11/02/2020    9:47 AM 10/28/2019   11:13 AM 10/28/2019   11:03 AM  PHQ 2/9 Scores  PHQ - 2 Score 1 0 0 0 0 1 0  PHQ- 9 Score      5     Fall Risk    12/01/2022   10:09 AM 11/25/2021   10:26 AM 04/27/2021    9:49 AM 11/05/2020    9:43 AM 05/03/2020   10:46 AM  Fall Risk   Falls in the past year? '1 1 1 '$ 0 0  Number falls in past yr: '1 1 1 1 '$ 0  Injury  with Fall? '1 1 1 1 '$ 0  Comment back and knee injury nose injury  scrapes and bruises   Risk for fall due to : Impaired vision;Impaired balance/gait;History of fall(s) Impaired balance/gait;Impaired vision  History of fall(s);Impaired balance/gait;Impaired vision;Impaired mobility   Follow up Falls prevention discussed Falls prevention discussed  Falls prevention discussed     FALL RISK PREVENTION PERTAINING TO THE HOME:  Any stairs in or around the home? Yes  If so, are there any without handrails? No  Home free of loose throw rugs in walkways, pet beds, electrical cords, etc? Yes  Adequate lighting in your home to reduce risk of falls? Yes   ASSISTIVE DEVICES UTILIZED TO PREVENT FALLS:  Life alert? yes Use of a cane, walker or w/c? Yes  Grab bars in the bathroom? Yes  Shower chair or bench in shower? Yes  Elevated toilet seat or a handicapped toilet? No   TIMED UP AND GO:  Was the test performed? No .  Cognitive Function:        12/01/2022   10:11 AM 11/25/2021   10:30 AM 11/05/2020    9:48 AM  6CIT Screen  What Year? 0 points 0 points 0 points  What month? 0 points 0 points 0 points  What time? 0 points 0 points   Count back from 20 0 points 0 points 0 points  Months in reverse 2 points 0 points 0 points  Repeat phrase 2 points 0 points 0 points  Total Score 4 points 0 points     Immunizations Immunization History  Administered Date(s) Administered   Fluad Quad(high Dose 65+) 09/01/2020, 09/20/2020   H1N1 10/20/2008   Influenza, High Dose Seasonal PF 08/20/2010, 08/20/2013, 08/21/2015, 08/31/2016,  08/20/2017, 09/04/2017, 08/20/2018, 09/12/2018   Influenza,inj,Quad PF,6+ Mos 09/08/2016   Influenza-Unspecified 08/20/2001, 08/21/2003, 09/13/2004, 08/20/2005, 08/20/2006, 08/26/2007, 08/20/2008, 08/20/2009, 08/20/2010, 08/21/2011, 08/25/2014, 08/01/2019, 08/24/2021   Moderna Covid-19 Vaccine Bivalent Booster 43yr & up 08/24/2021   Moderna Sars-Covid-2 Vaccination  12/11/2019, 01/15/2020, 09/20/2020, 01/11/2022   PFIZER Comirnaty(Gray Top)Covid-19 Tri-Sucrose Vaccine 04/11/2021   Pneumococcal Conjugate-13 01/23/2014   Pneumococcal-Unspecified 08/20/2000   Td 04/30/2017   Tdap 11/21/2010, 01/27/2021   Zoster, Live 01/19/2015    TDAP status: Up to date  Flu Vaccine status: Due, Education has been provided regarding the importance of this vaccine. Advised may receive this vaccine at local pharmacy or Health Dept. Aware to provide a copy of the vaccination record if obtained from local pharmacy or Health Dept. Verbalized acceptance and understanding.  Pneumococcal vaccine status: Due, Education has been provided regarding the importance of this vaccine. Advised may receive this vaccine at local pharmacy or Health Dept. Aware to provide a copy of the vaccination record if obtained from local pharmacy or Health Dept. Verbalized acceptance and understanding.  Covid-19 vaccine status: Completed vaccines  Qualifies for Shingles Vaccine? Yes   Zostavax completed No   Shingrix Completed?: No.    Education has been provided regarding the importance of this vaccine. Patient has been advised to call insurance company to determine out of pocket expense if they have not yet received this vaccine. Advised may also receive vaccine at local pharmacy or Health Dept. Verbalized acceptance and understanding.  Screening Tests Health Maintenance  Topic Date Due   Zoster Vaccines- Shingrix (1 of 2) Never done   INFLUENZA VACCINE  06/20/2022   COVID-19 Vaccine (7 - 2023-24 season) 07/21/2022   Pneumonia Vaccine 87 Years old (2 - PPSV23 or PCV20) 06/06/2023 (Originally 01/24/2015)   Medicare Annual Wellness (AWV)  12/02/2023   DTaP/Tdap/Td (4 - Td or Tdap) 01/28/2031   HPV VACCINES  Aged Out    Health Maintenance  Health Maintenance Due  Topic Date Due   Zoster Vaccines- Shingrix (1 of 2) Never done   INFLUENZA VACCINE  06/20/2022   COVID-19 Vaccine (7 - 2023-24  season) 07/21/2022    Colorectal cancer screening: No longer required.    Additional Screening:    Vision Screening: Recommended annual ophthalmology exams for early detection of glaucoma and other disorders of the eye. Is the patient up to date with their annual eye exam?  Yes  Who is the provider or what is the name of the office in which the patient attends annual eye exams? Dr GDelman Cheadle If pt is not established with a provider, would they like to be referred to a provider to establish care? No .   Dental Screening: Recommended annual dental exams for proper oral hygiene  Community Resource Referral / Chronic Care Management: CRR required this visit?  No   CCM required this visit?  No      Plan:     I have personally reviewed and noted the following in the patient's chart:   Medical and social history Use of alcohol, tobacco or illicit drugs  Current medications and supplements including opioid prescriptions. Patient is not currently taking opioid prescriptions. Functional ability and status Nutritional status Physical activity Advanced directives List of other physicians Hospitalizations, surgeries, and ER visits in previous 12 months Vitals Screenings to include cognitive, depression, and falls Referrals and appointments  In addition, I have reviewed and discussed with patient certain preventive protocols, quality metrics, and best practice recommendations. A written personalized care plan for  preventive services as well as general preventive health recommendations were provided to patient.     Willette Brace, LPN   2/34/1443   Nurse Notes: none

## 2022-12-07 ENCOUNTER — Encounter: Payer: Self-pay | Admitting: Family Medicine

## 2022-12-07 ENCOUNTER — Ambulatory Visit (INDEPENDENT_AMBULATORY_CARE_PROVIDER_SITE_OTHER): Payer: Medicare Other | Admitting: Family Medicine

## 2022-12-07 VITALS — BP 122/68 | HR 68 | Temp 97.7°F | Ht 70.0 in | Wt 175.8 lb

## 2022-12-07 DIAGNOSIS — R739 Hyperglycemia, unspecified: Secondary | ICD-10-CM | POA: Diagnosis not present

## 2022-12-07 DIAGNOSIS — E559 Vitamin D deficiency, unspecified: Secondary | ICD-10-CM

## 2022-12-07 DIAGNOSIS — E785 Hyperlipidemia, unspecified: Secondary | ICD-10-CM

## 2022-12-07 DIAGNOSIS — K219 Gastro-esophageal reflux disease without esophagitis: Secondary | ICD-10-CM | POA: Diagnosis not present

## 2022-12-07 DIAGNOSIS — M1A0790 Idiopathic chronic gout, unspecified ankle and foot, without tophus (tophi): Secondary | ICD-10-CM

## 2022-12-07 DIAGNOSIS — D692 Other nonthrombocytopenic purpura: Secondary | ICD-10-CM

## 2022-12-07 DIAGNOSIS — I700 Atherosclerosis of aorta: Secondary | ICD-10-CM | POA: Diagnosis not present

## 2022-12-07 NOTE — Progress Notes (Signed)
Phone [phone] In person visit   Subjective:   Allen Robinson is a 89 y.o. year old very pleasant male patient who presents for/with See problem oriented charting Chief Complaint  Patient presents with   Follow-up   Hyperlipidemia    Past Medical History-  Patient Active Problem List   Diagnosis Date Noted   Aortic atherosclerosis ([facility]) 05/21/2019    Priority: Medium    Gout 03/14/2017    Priority: Medium    BPH associated with nocturia 03/14/2017    Priority: Medium    Hyperlipidemia 03/14/2017    Priority: Medium    GERD (gastroesophageal reflux disease) 03/14/2017    Priority: Medium    Senile purpura ([facility]) 05/03/2020    Priority: Low   Right rib fracture 05/15/2019    Priority: Low   Hyperglycemia 04/28/2019    Priority: Low   Vitamin D deficiency 03/14/2017    Priority: Low   Peripheral polyneuropathy 02/16/2016    Priority: Low   Diplopia 11/19/2015    Priority: Low    Medications- reviewed and updated Current Outpatient Medications  Medication Sig Dispense Refill   allopurinol (ZYLOPRIM) 100 MG tablet TAKE TWO TABLETS BY MOUTH DAILY TO PREVENT GOUT, NOT FOR ACUTE ATTACKS, MUST TAKE EVERY DAY     colchicine 0.6 MG tablet Take 0.6 mg by mouth as needed.     finasteride (PROSCAR) 5 MG tablet Take 1 tablet by mouth daily.     Multiple Vitamin (MULTIVITAMIN ADULT PO) Take by mouth.     omeprazole (PRILOSEC) 20 MG capsule Take 20 mg by mouth daily as needed.      simvastatin (ZOCOR) 40 MG tablet Take 20 mg by mouth at bedtime.      terazosin (HYTRIN) 2 MG capsule Take 2 mg by mouth at bedtime.     Vitamin D, Ergocalciferol, (DRISDOL) 1.25 MG (50000 UNIT) CAPS capsule Take 1 capsule (50,000 Units total) by mouth every 7 (seven) days. 12 capsule 0   No current facility-administered medications for this visit.     Objective:  BP 122/68   Pulse 68   Temp 97.7 F (36.5 C)   Ht '5\' 10"'$  (1.778 m)   Wt 175 lb 12.8 oz (79.7 kg)   SpO2 95%   BMI 25.22  kg/m  Gen: NAD, resting comfortably CV: RRR no murmurs rubs or gallops Lungs: CTAB no crackles, wheeze, rhonchi Ext: trace to 1+ edema Skin: warm, dry     Assessment and Plan   #Memory issues- has noted some mild memory issues. Less sense of direction but has not gotten lost- travels with wife.   # Balance Issues  S: proprioception issues with neuropathy contribute, cannot go up stairs safely without railings.  -[facility] DID better with [facility] Purpura and he is using cane more regularly in 2023- 2024. Has also had MRI -has had some falls -[facility] also [facility] to [facility] for Parkinson's- sounds like DAT from June 21, 2018 A/P: continued balance issues and fall issues  -recommended STRONGLY advancing to walker from cane - recommended continuing to work on strengthening with classes at [employer] -offered referral to neurologist for opinion- he wants to monitor  # Hyperlipidemia/aortic atherosclerosis - incidental finding on prior imaging with ideal LDL goal under 70 S:Medication - Simvastatin 40 mg Lab Results  Component Value Date   CHOL 153 02/14/2022   HDL 47 02/14/2022   LDLCALC 74 11/03/2021   TRIG 106 02/14/2022  CHOLHDL 3 11/03/2021  A/P: has been well controlled- we will await [facility] in march- he is always good about dropping these off for [place].   Aortic atherosclerosis (presumed stable)- LDL goal ideally <70 - continue current medications   #Gout-uric acid has been below 6 S: 0 flares since last visit on Allopurinol 100 mg tablet.  Has colchicine available if needed A/P:doing well without flares- continue allopurinol for prevention    # GERD S:Medication -  Omeprazole 20 mg - not taking often A/P: doing well with sparing use- continue current medication if needed   #BPH S: Medication:- Finasteride 5 mg from the [place], terazosin 2 mg (started last week) - prior on Vesicare -Nocturia - gets up 3x a night to urinate still even with  this combo A/P: enlarged prostate- continue finasteride. Since you do not feel the terazosin is helpful in last week you can trial off of this at home if no improvement within next few weeks and discontinue if no change in symptoms   # Hyperglycemia/insulin resistance/prediabetes-peak recent A1c 6.3 in 2020 S: Exercise and diet - exercising with [employer] classes reasonablye. Reasonably healthy diet Lab Results  Component Value Date   HGBA1C 6.0 02/14/2022   HGBA1C 6.3 11/03/2021   HGBA1C 6.2 01/24/2021  A/P: hopefully stable- will be [facility]- for now continue without meds   #Vitamin D deficiency S: Medication: Patient takes 10,000 units daily Last vitamin D Lab Results  Component Value Date   VD25OH 35.87 11/03/2021  A/P: states will be checked [facility] soon- we will hold off today  #senile purpura- noted. Stable. Check cbc at least annually   # Constipation-sparing MiraLAX if needed.  Colace if needed. Works well   Recommended follow up: Return in about 6 months (around 06/07/2023) for followup or sooner if needed.Schedule b4 you leave.  Lab/Order associations:   ICD-10-CM   1. Hyperlipidemia, unspecified hyperlipidemia type  E78.5     2. Chronic idiopathic gout involving toe without tophus, unspecified laterality  M1A.0790     3. Gastroesophageal reflux disease without esophagitis  K21.9     4. Senile purpura (HCC)  D69.2     5. Aortic atherosclerosis (HCC) Chronic I70.0     6. Vitamin D deficiency  E55.9     7. Hyperglycemia  R73.9       Return precautions advised.  Garret Reddish, MD

## 2022-12-07 NOTE — Patient Instructions (Addendum)
-  recommended STRONGLY advancing to walker from cane - recommended continuing to work on strengthening with classes at [employer]  enlarged prostate- continue finasteride. Since you do not feel the terazosin is helpful in last week you can trial off of this at home if no improvement within next few weeks and discontinue if no change in symptoms   Recommended follow up: Return in about 6 months (around 06/07/2023) for followup or sooner if needed.Schedule b4 you leave.

## 2023-01-22 DIAGNOSIS — L821 Other seborrheic keratosis: Secondary | ICD-10-CM | POA: Diagnosis not present

## 2023-01-22 DIAGNOSIS — D485 Neoplasm of uncertain behavior of skin: Secondary | ICD-10-CM | POA: Diagnosis not present

## 2023-01-22 DIAGNOSIS — D1801 Hemangioma of skin and subcutaneous tissue: Secondary | ICD-10-CM | POA: Diagnosis not present

## 2023-01-22 DIAGNOSIS — D692 Other nonthrombocytopenic purpura: Secondary | ICD-10-CM | POA: Diagnosis not present

## 2023-01-22 DIAGNOSIS — Z85828 Personal history of other malignant neoplasm of skin: Secondary | ICD-10-CM | POA: Diagnosis not present

## 2023-01-22 DIAGNOSIS — L812 Freckles: Secondary | ICD-10-CM | POA: Diagnosis not present

## 2023-01-22 DIAGNOSIS — C44212 Basal cell carcinoma of skin of right ear and external auricular canal: Secondary | ICD-10-CM | POA: Diagnosis not present

## 2023-01-22 DIAGNOSIS — C44319 Basal cell carcinoma of skin of other parts of face: Secondary | ICD-10-CM | POA: Diagnosis not present

## 2023-02-20 LAB — CBC: RBC: 4.35 (ref 3.87–5.11)

## 2023-02-20 LAB — LIPID PANEL
HDL: 44 (ref 35–70)
LDL Cholesterol: 89
Triglycerides: 90 (ref 40–160)

## 2023-02-20 LAB — BASIC METABOLIC PANEL: Glucose: 95

## 2023-02-20 LAB — HEMOGLOBIN A1C: Hemoglobin A1C: 6.2

## 2023-02-20 LAB — CBC AND DIFFERENTIAL
HCT: 40 — AB (ref 41–53)
Hemoglobin: 13.6 (ref 13.5–17.5)
Platelets: 162 10*3/uL (ref 150–400)
WBC: 6

## 2023-02-20 LAB — COMPREHENSIVE METABOLIC PANEL: eGFR: 51

## 2023-02-20 LAB — HEPATIC FUNCTION PANEL: ALT: 31 U/L (ref 10–40)

## 2023-03-05 DIAGNOSIS — Z85828 Personal history of other malignant neoplasm of skin: Secondary | ICD-10-CM | POA: Diagnosis not present

## 2023-03-05 DIAGNOSIS — C44212 Basal cell carcinoma of skin of right ear and external auricular canal: Secondary | ICD-10-CM | POA: Diagnosis not present

## 2023-03-08 DIAGNOSIS — R2681 Unsteadiness on feet: Secondary | ICD-10-CM | POA: Diagnosis not present

## 2023-03-08 DIAGNOSIS — M6259 Muscle wasting and atrophy, not elsewhere classified, multiple sites: Secondary | ICD-10-CM | POA: Diagnosis not present

## 2023-03-08 DIAGNOSIS — M1380 Other specified arthritis, unspecified site: Secondary | ICD-10-CM | POA: Diagnosis not present

## 2023-03-08 DIAGNOSIS — R2689 Other abnormalities of gait and mobility: Secondary | ICD-10-CM | POA: Diagnosis not present

## 2023-03-08 DIAGNOSIS — R296 Repeated falls: Secondary | ICD-10-CM | POA: Diagnosis not present

## 2023-03-13 DIAGNOSIS — R296 Repeated falls: Secondary | ICD-10-CM | POA: Diagnosis not present

## 2023-03-13 DIAGNOSIS — R2681 Unsteadiness on feet: Secondary | ICD-10-CM | POA: Diagnosis not present

## 2023-03-13 DIAGNOSIS — R2689 Other abnormalities of gait and mobility: Secondary | ICD-10-CM | POA: Diagnosis not present

## 2023-03-13 DIAGNOSIS — M6259 Muscle wasting and atrophy, not elsewhere classified, multiple sites: Secondary | ICD-10-CM | POA: Diagnosis not present

## 2023-03-13 DIAGNOSIS — M1380 Other specified arthritis, unspecified site: Secondary | ICD-10-CM | POA: Diagnosis not present

## 2023-03-15 DIAGNOSIS — R2689 Other abnormalities of gait and mobility: Secondary | ICD-10-CM | POA: Diagnosis not present

## 2023-03-15 DIAGNOSIS — R2681 Unsteadiness on feet: Secondary | ICD-10-CM | POA: Diagnosis not present

## 2023-03-15 DIAGNOSIS — M1380 Other specified arthritis, unspecified site: Secondary | ICD-10-CM | POA: Diagnosis not present

## 2023-03-15 DIAGNOSIS — R296 Repeated falls: Secondary | ICD-10-CM | POA: Diagnosis not present

## 2023-03-15 DIAGNOSIS — M6259 Muscle wasting and atrophy, not elsewhere classified, multiple sites: Secondary | ICD-10-CM | POA: Diagnosis not present

## 2023-03-16 DIAGNOSIS — M6259 Muscle wasting and atrophy, not elsewhere classified, multiple sites: Secondary | ICD-10-CM | POA: Diagnosis not present

## 2023-03-16 DIAGNOSIS — R296 Repeated falls: Secondary | ICD-10-CM | POA: Diagnosis not present

## 2023-03-16 DIAGNOSIS — R2689 Other abnormalities of gait and mobility: Secondary | ICD-10-CM | POA: Diagnosis not present

## 2023-03-16 DIAGNOSIS — R2681 Unsteadiness on feet: Secondary | ICD-10-CM | POA: Diagnosis not present

## 2023-03-16 DIAGNOSIS — M1380 Other specified arthritis, unspecified site: Secondary | ICD-10-CM | POA: Diagnosis not present

## 2023-03-19 DIAGNOSIS — R2689 Other abnormalities of gait and mobility: Secondary | ICD-10-CM | POA: Diagnosis not present

## 2023-03-19 DIAGNOSIS — M6259 Muscle wasting and atrophy, not elsewhere classified, multiple sites: Secondary | ICD-10-CM | POA: Diagnosis not present

## 2023-03-19 DIAGNOSIS — R2681 Unsteadiness on feet: Secondary | ICD-10-CM | POA: Diagnosis not present

## 2023-03-19 DIAGNOSIS — R296 Repeated falls: Secondary | ICD-10-CM | POA: Diagnosis not present

## 2023-03-19 DIAGNOSIS — M1380 Other specified arthritis, unspecified site: Secondary | ICD-10-CM | POA: Diagnosis not present

## 2023-03-21 DIAGNOSIS — R2689 Other abnormalities of gait and mobility: Secondary | ICD-10-CM | POA: Diagnosis not present

## 2023-03-21 DIAGNOSIS — M6259 Muscle wasting and atrophy, not elsewhere classified, multiple sites: Secondary | ICD-10-CM | POA: Diagnosis not present

## 2023-03-21 DIAGNOSIS — R296 Repeated falls: Secondary | ICD-10-CM | POA: Diagnosis not present

## 2023-03-21 DIAGNOSIS — R2681 Unsteadiness on feet: Secondary | ICD-10-CM | POA: Diagnosis not present

## 2023-03-21 DIAGNOSIS — M1380 Other specified arthritis, unspecified site: Secondary | ICD-10-CM | POA: Diagnosis not present

## 2023-03-23 DIAGNOSIS — R2689 Other abnormalities of gait and mobility: Secondary | ICD-10-CM | POA: Diagnosis not present

## 2023-03-23 DIAGNOSIS — R2681 Unsteadiness on feet: Secondary | ICD-10-CM | POA: Diagnosis not present

## 2023-03-23 DIAGNOSIS — M6259 Muscle wasting and atrophy, not elsewhere classified, multiple sites: Secondary | ICD-10-CM | POA: Diagnosis not present

## 2023-03-23 DIAGNOSIS — R296 Repeated falls: Secondary | ICD-10-CM | POA: Diagnosis not present

## 2023-03-23 DIAGNOSIS — M1380 Other specified arthritis, unspecified site: Secondary | ICD-10-CM | POA: Diagnosis not present

## 2023-03-26 DIAGNOSIS — M6259 Muscle wasting and atrophy, not elsewhere classified, multiple sites: Secondary | ICD-10-CM | POA: Diagnosis not present

## 2023-03-26 DIAGNOSIS — R296 Repeated falls: Secondary | ICD-10-CM | POA: Diagnosis not present

## 2023-03-26 DIAGNOSIS — R2689 Other abnormalities of gait and mobility: Secondary | ICD-10-CM | POA: Diagnosis not present

## 2023-03-26 DIAGNOSIS — R2681 Unsteadiness on feet: Secondary | ICD-10-CM | POA: Diagnosis not present

## 2023-03-26 DIAGNOSIS — M1380 Other specified arthritis, unspecified site: Secondary | ICD-10-CM | POA: Diagnosis not present

## 2023-03-28 DIAGNOSIS — R296 Repeated falls: Secondary | ICD-10-CM | POA: Diagnosis not present

## 2023-03-28 DIAGNOSIS — M1380 Other specified arthritis, unspecified site: Secondary | ICD-10-CM | POA: Diagnosis not present

## 2023-03-28 DIAGNOSIS — R2681 Unsteadiness on feet: Secondary | ICD-10-CM | POA: Diagnosis not present

## 2023-03-28 DIAGNOSIS — M6259 Muscle wasting and atrophy, not elsewhere classified, multiple sites: Secondary | ICD-10-CM | POA: Diagnosis not present

## 2023-03-28 DIAGNOSIS — R2689 Other abnormalities of gait and mobility: Secondary | ICD-10-CM | POA: Diagnosis not present

## 2023-03-29 DIAGNOSIS — R2689 Other abnormalities of gait and mobility: Secondary | ICD-10-CM | POA: Diagnosis not present

## 2023-03-29 DIAGNOSIS — M1380 Other specified arthritis, unspecified site: Secondary | ICD-10-CM | POA: Diagnosis not present

## 2023-03-29 DIAGNOSIS — M6259 Muscle wasting and atrophy, not elsewhere classified, multiple sites: Secondary | ICD-10-CM | POA: Diagnosis not present

## 2023-03-29 DIAGNOSIS — R2681 Unsteadiness on feet: Secondary | ICD-10-CM | POA: Diagnosis not present

## 2023-03-29 DIAGNOSIS — R296 Repeated falls: Secondary | ICD-10-CM | POA: Diagnosis not present

## 2023-03-30 DIAGNOSIS — M1380 Other specified arthritis, unspecified site: Secondary | ICD-10-CM | POA: Diagnosis not present

## 2023-03-30 DIAGNOSIS — R2689 Other abnormalities of gait and mobility: Secondary | ICD-10-CM | POA: Diagnosis not present

## 2023-03-30 DIAGNOSIS — R2681 Unsteadiness on feet: Secondary | ICD-10-CM | POA: Diagnosis not present

## 2023-03-30 DIAGNOSIS — R296 Repeated falls: Secondary | ICD-10-CM | POA: Diagnosis not present

## 2023-03-30 DIAGNOSIS — M6259 Muscle wasting and atrophy, not elsewhere classified, multiple sites: Secondary | ICD-10-CM | POA: Diagnosis not present

## 2023-04-02 DIAGNOSIS — M6259 Muscle wasting and atrophy, not elsewhere classified, multiple sites: Secondary | ICD-10-CM | POA: Diagnosis not present

## 2023-04-02 DIAGNOSIS — R296 Repeated falls: Secondary | ICD-10-CM | POA: Diagnosis not present

## 2023-04-02 DIAGNOSIS — R2689 Other abnormalities of gait and mobility: Secondary | ICD-10-CM | POA: Diagnosis not present

## 2023-04-02 DIAGNOSIS — M1380 Other specified arthritis, unspecified site: Secondary | ICD-10-CM | POA: Diagnosis not present

## 2023-04-02 DIAGNOSIS — R2681 Unsteadiness on feet: Secondary | ICD-10-CM | POA: Diagnosis not present

## 2023-04-04 DIAGNOSIS — M6259 Muscle wasting and atrophy, not elsewhere classified, multiple sites: Secondary | ICD-10-CM | POA: Diagnosis not present

## 2023-04-04 DIAGNOSIS — M1380 Other specified arthritis, unspecified site: Secondary | ICD-10-CM | POA: Diagnosis not present

## 2023-04-04 DIAGNOSIS — R296 Repeated falls: Secondary | ICD-10-CM | POA: Diagnosis not present

## 2023-04-04 DIAGNOSIS — R2681 Unsteadiness on feet: Secondary | ICD-10-CM | POA: Diagnosis not present

## 2023-04-04 DIAGNOSIS — R2689 Other abnormalities of gait and mobility: Secondary | ICD-10-CM | POA: Diagnosis not present

## 2023-04-05 DIAGNOSIS — M6259 Muscle wasting and atrophy, not elsewhere classified, multiple sites: Secondary | ICD-10-CM | POA: Diagnosis not present

## 2023-04-05 DIAGNOSIS — R2681 Unsteadiness on feet: Secondary | ICD-10-CM | POA: Diagnosis not present

## 2023-04-05 DIAGNOSIS — M1380 Other specified arthritis, unspecified site: Secondary | ICD-10-CM | POA: Diagnosis not present

## 2023-04-05 DIAGNOSIS — R2689 Other abnormalities of gait and mobility: Secondary | ICD-10-CM | POA: Diagnosis not present

## 2023-04-05 DIAGNOSIS — R296 Repeated falls: Secondary | ICD-10-CM | POA: Diagnosis not present

## 2023-04-06 DIAGNOSIS — M6259 Muscle wasting and atrophy, not elsewhere classified, multiple sites: Secondary | ICD-10-CM | POA: Diagnosis not present

## 2023-04-06 DIAGNOSIS — M1380 Other specified arthritis, unspecified site: Secondary | ICD-10-CM | POA: Diagnosis not present

## 2023-04-06 DIAGNOSIS — R2681 Unsteadiness on feet: Secondary | ICD-10-CM | POA: Diagnosis not present

## 2023-04-06 DIAGNOSIS — R2689 Other abnormalities of gait and mobility: Secondary | ICD-10-CM | POA: Diagnosis not present

## 2023-04-06 DIAGNOSIS — R296 Repeated falls: Secondary | ICD-10-CM | POA: Diagnosis not present

## 2023-04-09 DIAGNOSIS — M1380 Other specified arthritis, unspecified site: Secondary | ICD-10-CM | POA: Diagnosis not present

## 2023-04-09 DIAGNOSIS — M6259 Muscle wasting and atrophy, not elsewhere classified, multiple sites: Secondary | ICD-10-CM | POA: Diagnosis not present

## 2023-04-09 DIAGNOSIS — R296 Repeated falls: Secondary | ICD-10-CM | POA: Diagnosis not present

## 2023-04-09 DIAGNOSIS — R2689 Other abnormalities of gait and mobility: Secondary | ICD-10-CM | POA: Diagnosis not present

## 2023-04-09 DIAGNOSIS — R2681 Unsteadiness on feet: Secondary | ICD-10-CM | POA: Diagnosis not present

## 2023-04-11 DIAGNOSIS — M6259 Muscle wasting and atrophy, not elsewhere classified, multiple sites: Secondary | ICD-10-CM | POA: Diagnosis not present

## 2023-04-11 DIAGNOSIS — R296 Repeated falls: Secondary | ICD-10-CM | POA: Diagnosis not present

## 2023-04-11 DIAGNOSIS — M1380 Other specified arthritis, unspecified site: Secondary | ICD-10-CM | POA: Diagnosis not present

## 2023-04-11 DIAGNOSIS — R2681 Unsteadiness on feet: Secondary | ICD-10-CM | POA: Diagnosis not present

## 2023-04-11 DIAGNOSIS — R2689 Other abnormalities of gait and mobility: Secondary | ICD-10-CM | POA: Diagnosis not present

## 2023-04-13 DIAGNOSIS — R296 Repeated falls: Secondary | ICD-10-CM | POA: Diagnosis not present

## 2023-04-13 DIAGNOSIS — R2681 Unsteadiness on feet: Secondary | ICD-10-CM | POA: Diagnosis not present

## 2023-04-13 DIAGNOSIS — R2689 Other abnormalities of gait and mobility: Secondary | ICD-10-CM | POA: Diagnosis not present

## 2023-04-13 DIAGNOSIS — M6259 Muscle wasting and atrophy, not elsewhere classified, multiple sites: Secondary | ICD-10-CM | POA: Diagnosis not present

## 2023-04-13 DIAGNOSIS — M1380 Other specified arthritis, unspecified site: Secondary | ICD-10-CM | POA: Diagnosis not present

## 2023-04-17 DIAGNOSIS — R296 Repeated falls: Secondary | ICD-10-CM | POA: Diagnosis not present

## 2023-04-17 DIAGNOSIS — M1380 Other specified arthritis, unspecified site: Secondary | ICD-10-CM | POA: Diagnosis not present

## 2023-04-17 DIAGNOSIS — M6259 Muscle wasting and atrophy, not elsewhere classified, multiple sites: Secondary | ICD-10-CM | POA: Diagnosis not present

## 2023-04-17 DIAGNOSIS — R2681 Unsteadiness on feet: Secondary | ICD-10-CM | POA: Diagnosis not present

## 2023-04-17 DIAGNOSIS — R2689 Other abnormalities of gait and mobility: Secondary | ICD-10-CM | POA: Diagnosis not present

## 2023-04-19 DIAGNOSIS — M1380 Other specified arthritis, unspecified site: Secondary | ICD-10-CM | POA: Diagnosis not present

## 2023-04-19 DIAGNOSIS — R296 Repeated falls: Secondary | ICD-10-CM | POA: Diagnosis not present

## 2023-04-19 DIAGNOSIS — M6259 Muscle wasting and atrophy, not elsewhere classified, multiple sites: Secondary | ICD-10-CM | POA: Diagnosis not present

## 2023-04-19 DIAGNOSIS — R2681 Unsteadiness on feet: Secondary | ICD-10-CM | POA: Diagnosis not present

## 2023-04-19 DIAGNOSIS — R2689 Other abnormalities of gait and mobility: Secondary | ICD-10-CM | POA: Diagnosis not present

## 2023-04-24 DIAGNOSIS — R2689 Other abnormalities of gait and mobility: Secondary | ICD-10-CM | POA: Diagnosis not present

## 2023-04-24 DIAGNOSIS — R296 Repeated falls: Secondary | ICD-10-CM | POA: Diagnosis not present

## 2023-04-24 DIAGNOSIS — R488 Other symbolic dysfunctions: Secondary | ICD-10-CM | POA: Diagnosis not present

## 2023-04-24 DIAGNOSIS — M1380 Other specified arthritis, unspecified site: Secondary | ICD-10-CM | POA: Diagnosis not present

## 2023-04-24 DIAGNOSIS — R2681 Unsteadiness on feet: Secondary | ICD-10-CM | POA: Diagnosis not present

## 2023-04-24 DIAGNOSIS — M6259 Muscle wasting and atrophy, not elsewhere classified, multiple sites: Secondary | ICD-10-CM | POA: Diagnosis not present

## 2023-04-24 DIAGNOSIS — R41841 Cognitive communication deficit: Secondary | ICD-10-CM | POA: Diagnosis not present

## 2023-04-26 DIAGNOSIS — R41841 Cognitive communication deficit: Secondary | ICD-10-CM | POA: Diagnosis not present

## 2023-04-26 DIAGNOSIS — M1380 Other specified arthritis, unspecified site: Secondary | ICD-10-CM | POA: Diagnosis not present

## 2023-04-26 DIAGNOSIS — R2681 Unsteadiness on feet: Secondary | ICD-10-CM | POA: Diagnosis not present

## 2023-04-26 DIAGNOSIS — M6259 Muscle wasting and atrophy, not elsewhere classified, multiple sites: Secondary | ICD-10-CM | POA: Diagnosis not present

## 2023-04-26 DIAGNOSIS — R488 Other symbolic dysfunctions: Secondary | ICD-10-CM | POA: Diagnosis not present

## 2023-04-26 DIAGNOSIS — R2689 Other abnormalities of gait and mobility: Secondary | ICD-10-CM | POA: Diagnosis not present

## 2023-04-27 DIAGNOSIS — R2689 Other abnormalities of gait and mobility: Secondary | ICD-10-CM | POA: Diagnosis not present

## 2023-04-27 DIAGNOSIS — R488 Other symbolic dysfunctions: Secondary | ICD-10-CM | POA: Diagnosis not present

## 2023-04-27 DIAGNOSIS — M1380 Other specified arthritis, unspecified site: Secondary | ICD-10-CM | POA: Diagnosis not present

## 2023-04-27 DIAGNOSIS — M6259 Muscle wasting and atrophy, not elsewhere classified, multiple sites: Secondary | ICD-10-CM | POA: Diagnosis not present

## 2023-04-27 DIAGNOSIS — R41841 Cognitive communication deficit: Secondary | ICD-10-CM | POA: Diagnosis not present

## 2023-04-27 DIAGNOSIS — R2681 Unsteadiness on feet: Secondary | ICD-10-CM | POA: Diagnosis not present

## 2023-04-30 DIAGNOSIS — R2689 Other abnormalities of gait and mobility: Secondary | ICD-10-CM | POA: Diagnosis not present

## 2023-04-30 DIAGNOSIS — M1380 Other specified arthritis, unspecified site: Secondary | ICD-10-CM | POA: Diagnosis not present

## 2023-04-30 DIAGNOSIS — R2681 Unsteadiness on feet: Secondary | ICD-10-CM | POA: Diagnosis not present

## 2023-04-30 DIAGNOSIS — M6259 Muscle wasting and atrophy, not elsewhere classified, multiple sites: Secondary | ICD-10-CM | POA: Diagnosis not present

## 2023-04-30 DIAGNOSIS — R41841 Cognitive communication deficit: Secondary | ICD-10-CM | POA: Diagnosis not present

## 2023-04-30 DIAGNOSIS — R488 Other symbolic dysfunctions: Secondary | ICD-10-CM | POA: Diagnosis not present

## 2023-05-01 DIAGNOSIS — R2689 Other abnormalities of gait and mobility: Secondary | ICD-10-CM | POA: Diagnosis not present

## 2023-05-01 DIAGNOSIS — M6259 Muscle wasting and atrophy, not elsewhere classified, multiple sites: Secondary | ICD-10-CM | POA: Diagnosis not present

## 2023-05-01 DIAGNOSIS — R2681 Unsteadiness on feet: Secondary | ICD-10-CM | POA: Diagnosis not present

## 2023-05-01 DIAGNOSIS — R488 Other symbolic dysfunctions: Secondary | ICD-10-CM | POA: Diagnosis not present

## 2023-05-01 DIAGNOSIS — M1380 Other specified arthritis, unspecified site: Secondary | ICD-10-CM | POA: Diagnosis not present

## 2023-05-01 DIAGNOSIS — R41841 Cognitive communication deficit: Secondary | ICD-10-CM | POA: Diagnosis not present

## 2023-05-03 DIAGNOSIS — M6259 Muscle wasting and atrophy, not elsewhere classified, multiple sites: Secondary | ICD-10-CM | POA: Diagnosis not present

## 2023-05-03 DIAGNOSIS — R2681 Unsteadiness on feet: Secondary | ICD-10-CM | POA: Diagnosis not present

## 2023-05-03 DIAGNOSIS — R2689 Other abnormalities of gait and mobility: Secondary | ICD-10-CM | POA: Diagnosis not present

## 2023-05-03 DIAGNOSIS — R41841 Cognitive communication deficit: Secondary | ICD-10-CM | POA: Diagnosis not present

## 2023-05-03 DIAGNOSIS — M1380 Other specified arthritis, unspecified site: Secondary | ICD-10-CM | POA: Diagnosis not present

## 2023-05-03 DIAGNOSIS — R488 Other symbolic dysfunctions: Secondary | ICD-10-CM | POA: Diagnosis not present

## 2023-05-07 DIAGNOSIS — M6259 Muscle wasting and atrophy, not elsewhere classified, multiple sites: Secondary | ICD-10-CM | POA: Diagnosis not present

## 2023-05-07 DIAGNOSIS — M1380 Other specified arthritis, unspecified site: Secondary | ICD-10-CM | POA: Diagnosis not present

## 2023-05-07 DIAGNOSIS — R488 Other symbolic dysfunctions: Secondary | ICD-10-CM | POA: Diagnosis not present

## 2023-05-07 DIAGNOSIS — R2681 Unsteadiness on feet: Secondary | ICD-10-CM | POA: Diagnosis not present

## 2023-05-07 DIAGNOSIS — R41841 Cognitive communication deficit: Secondary | ICD-10-CM | POA: Diagnosis not present

## 2023-05-07 DIAGNOSIS — R2689 Other abnormalities of gait and mobility: Secondary | ICD-10-CM | POA: Diagnosis not present

## 2023-05-08 DIAGNOSIS — R2681 Unsteadiness on feet: Secondary | ICD-10-CM | POA: Diagnosis not present

## 2023-05-08 DIAGNOSIS — R2689 Other abnormalities of gait and mobility: Secondary | ICD-10-CM | POA: Diagnosis not present

## 2023-05-08 DIAGNOSIS — M1380 Other specified arthritis, unspecified site: Secondary | ICD-10-CM | POA: Diagnosis not present

## 2023-05-08 DIAGNOSIS — M6259 Muscle wasting and atrophy, not elsewhere classified, multiple sites: Secondary | ICD-10-CM | POA: Diagnosis not present

## 2023-05-08 DIAGNOSIS — R488 Other symbolic dysfunctions: Secondary | ICD-10-CM | POA: Diagnosis not present

## 2023-05-08 DIAGNOSIS — R41841 Cognitive communication deficit: Secondary | ICD-10-CM | POA: Diagnosis not present

## 2023-05-09 DIAGNOSIS — R2681 Unsteadiness on feet: Secondary | ICD-10-CM | POA: Diagnosis not present

## 2023-05-09 DIAGNOSIS — M1380 Other specified arthritis, unspecified site: Secondary | ICD-10-CM | POA: Diagnosis not present

## 2023-05-09 DIAGNOSIS — R2689 Other abnormalities of gait and mobility: Secondary | ICD-10-CM | POA: Diagnosis not present

## 2023-05-09 DIAGNOSIS — M6259 Muscle wasting and atrophy, not elsewhere classified, multiple sites: Secondary | ICD-10-CM | POA: Diagnosis not present

## 2023-05-09 DIAGNOSIS — R488 Other symbolic dysfunctions: Secondary | ICD-10-CM | POA: Diagnosis not present

## 2023-05-09 DIAGNOSIS — R41841 Cognitive communication deficit: Secondary | ICD-10-CM | POA: Diagnosis not present

## 2023-05-10 DIAGNOSIS — R41841 Cognitive communication deficit: Secondary | ICD-10-CM | POA: Diagnosis not present

## 2023-05-10 DIAGNOSIS — R2681 Unsteadiness on feet: Secondary | ICD-10-CM | POA: Diagnosis not present

## 2023-05-10 DIAGNOSIS — R488 Other symbolic dysfunctions: Secondary | ICD-10-CM | POA: Diagnosis not present

## 2023-05-10 DIAGNOSIS — M6259 Muscle wasting and atrophy, not elsewhere classified, multiple sites: Secondary | ICD-10-CM | POA: Diagnosis not present

## 2023-05-10 DIAGNOSIS — M1380 Other specified arthritis, unspecified site: Secondary | ICD-10-CM | POA: Diagnosis not present

## 2023-05-10 DIAGNOSIS — R2689 Other abnormalities of gait and mobility: Secondary | ICD-10-CM | POA: Diagnosis not present

## 2023-05-11 DIAGNOSIS — M6259 Muscle wasting and atrophy, not elsewhere classified, multiple sites: Secondary | ICD-10-CM | POA: Diagnosis not present

## 2023-05-11 DIAGNOSIS — R2681 Unsteadiness on feet: Secondary | ICD-10-CM | POA: Diagnosis not present

## 2023-05-11 DIAGNOSIS — M1380 Other specified arthritis, unspecified site: Secondary | ICD-10-CM | POA: Diagnosis not present

## 2023-05-11 DIAGNOSIS — R2689 Other abnormalities of gait and mobility: Secondary | ICD-10-CM | POA: Diagnosis not present

## 2023-05-11 DIAGNOSIS — R41841 Cognitive communication deficit: Secondary | ICD-10-CM | POA: Diagnosis not present

## 2023-05-11 DIAGNOSIS — R488 Other symbolic dysfunctions: Secondary | ICD-10-CM | POA: Diagnosis not present

## 2023-05-14 DIAGNOSIS — R488 Other symbolic dysfunctions: Secondary | ICD-10-CM | POA: Diagnosis not present

## 2023-05-14 DIAGNOSIS — R41841 Cognitive communication deficit: Secondary | ICD-10-CM | POA: Diagnosis not present

## 2023-05-14 DIAGNOSIS — M6259 Muscle wasting and atrophy, not elsewhere classified, multiple sites: Secondary | ICD-10-CM | POA: Diagnosis not present

## 2023-05-14 DIAGNOSIS — R2689 Other abnormalities of gait and mobility: Secondary | ICD-10-CM | POA: Diagnosis not present

## 2023-05-14 DIAGNOSIS — R2681 Unsteadiness on feet: Secondary | ICD-10-CM | POA: Diagnosis not present

## 2023-05-14 DIAGNOSIS — M1380 Other specified arthritis, unspecified site: Secondary | ICD-10-CM | POA: Diagnosis not present

## 2023-05-15 DIAGNOSIS — R2681 Unsteadiness on feet: Secondary | ICD-10-CM | POA: Diagnosis not present

## 2023-05-15 DIAGNOSIS — R2689 Other abnormalities of gait and mobility: Secondary | ICD-10-CM | POA: Diagnosis not present

## 2023-05-15 DIAGNOSIS — M6259 Muscle wasting and atrophy, not elsewhere classified, multiple sites: Secondary | ICD-10-CM | POA: Diagnosis not present

## 2023-05-15 DIAGNOSIS — R41841 Cognitive communication deficit: Secondary | ICD-10-CM | POA: Diagnosis not present

## 2023-05-15 DIAGNOSIS — R488 Other symbolic dysfunctions: Secondary | ICD-10-CM | POA: Diagnosis not present

## 2023-05-15 DIAGNOSIS — M1380 Other specified arthritis, unspecified site: Secondary | ICD-10-CM | POA: Diagnosis not present

## 2023-05-16 DIAGNOSIS — R2689 Other abnormalities of gait and mobility: Secondary | ICD-10-CM | POA: Diagnosis not present

## 2023-05-16 DIAGNOSIS — R2681 Unsteadiness on feet: Secondary | ICD-10-CM | POA: Diagnosis not present

## 2023-05-16 DIAGNOSIS — R488 Other symbolic dysfunctions: Secondary | ICD-10-CM | POA: Diagnosis not present

## 2023-05-16 DIAGNOSIS — R41841 Cognitive communication deficit: Secondary | ICD-10-CM | POA: Diagnosis not present

## 2023-05-16 DIAGNOSIS — M6259 Muscle wasting and atrophy, not elsewhere classified, multiple sites: Secondary | ICD-10-CM | POA: Diagnosis not present

## 2023-05-16 DIAGNOSIS — M1380 Other specified arthritis, unspecified site: Secondary | ICD-10-CM | POA: Diagnosis not present

## 2023-05-17 DIAGNOSIS — R2689 Other abnormalities of gait and mobility: Secondary | ICD-10-CM | POA: Diagnosis not present

## 2023-05-17 DIAGNOSIS — R2681 Unsteadiness on feet: Secondary | ICD-10-CM | POA: Diagnosis not present

## 2023-05-17 DIAGNOSIS — R41841 Cognitive communication deficit: Secondary | ICD-10-CM | POA: Diagnosis not present

## 2023-05-17 DIAGNOSIS — M1380 Other specified arthritis, unspecified site: Secondary | ICD-10-CM | POA: Diagnosis not present

## 2023-05-17 DIAGNOSIS — R488 Other symbolic dysfunctions: Secondary | ICD-10-CM | POA: Diagnosis not present

## 2023-05-17 DIAGNOSIS — M6259 Muscle wasting and atrophy, not elsewhere classified, multiple sites: Secondary | ICD-10-CM | POA: Diagnosis not present

## 2023-05-21 DIAGNOSIS — M1380 Other specified arthritis, unspecified site: Secondary | ICD-10-CM | POA: Diagnosis not present

## 2023-05-21 DIAGNOSIS — R2681 Unsteadiness on feet: Secondary | ICD-10-CM | POA: Diagnosis not present

## 2023-05-21 DIAGNOSIS — M6259 Muscle wasting and atrophy, not elsewhere classified, multiple sites: Secondary | ICD-10-CM | POA: Diagnosis not present

## 2023-05-21 DIAGNOSIS — R41841 Cognitive communication deficit: Secondary | ICD-10-CM | POA: Diagnosis not present

## 2023-05-21 DIAGNOSIS — R2689 Other abnormalities of gait and mobility: Secondary | ICD-10-CM | POA: Diagnosis not present

## 2023-05-21 DIAGNOSIS — R296 Repeated falls: Secondary | ICD-10-CM | POA: Diagnosis not present

## 2023-05-21 DIAGNOSIS — R488 Other symbolic dysfunctions: Secondary | ICD-10-CM | POA: Diagnosis not present

## 2023-05-22 DIAGNOSIS — M1380 Other specified arthritis, unspecified site: Secondary | ICD-10-CM | POA: Diagnosis not present

## 2023-05-22 DIAGNOSIS — R488 Other symbolic dysfunctions: Secondary | ICD-10-CM | POA: Diagnosis not present

## 2023-05-22 DIAGNOSIS — R2689 Other abnormalities of gait and mobility: Secondary | ICD-10-CM | POA: Diagnosis not present

## 2023-05-22 DIAGNOSIS — R2681 Unsteadiness on feet: Secondary | ICD-10-CM | POA: Diagnosis not present

## 2023-05-22 DIAGNOSIS — R41841 Cognitive communication deficit: Secondary | ICD-10-CM | POA: Diagnosis not present

## 2023-05-22 DIAGNOSIS — M6259 Muscle wasting and atrophy, not elsewhere classified, multiple sites: Secondary | ICD-10-CM | POA: Diagnosis not present

## 2023-05-23 DIAGNOSIS — M6259 Muscle wasting and atrophy, not elsewhere classified, multiple sites: Secondary | ICD-10-CM | POA: Diagnosis not present

## 2023-05-23 DIAGNOSIS — M1380 Other specified arthritis, unspecified site: Secondary | ICD-10-CM | POA: Diagnosis not present

## 2023-05-23 DIAGNOSIS — R2689 Other abnormalities of gait and mobility: Secondary | ICD-10-CM | POA: Diagnosis not present

## 2023-05-23 DIAGNOSIS — R41841 Cognitive communication deficit: Secondary | ICD-10-CM | POA: Diagnosis not present

## 2023-05-23 DIAGNOSIS — R2681 Unsteadiness on feet: Secondary | ICD-10-CM | POA: Diagnosis not present

## 2023-05-23 DIAGNOSIS — R488 Other symbolic dysfunctions: Secondary | ICD-10-CM | POA: Diagnosis not present

## 2023-05-25 ENCOUNTER — Ambulatory Visit (INDEPENDENT_AMBULATORY_CARE_PROVIDER_SITE_OTHER): Payer: Medicare Other | Admitting: Family Medicine

## 2023-05-25 ENCOUNTER — Telehealth: Payer: Self-pay | Admitting: Family Medicine

## 2023-05-25 ENCOUNTER — Encounter: Payer: Self-pay | Admitting: Family Medicine

## 2023-05-25 VITALS — BP 130/74 | HR 60 | Temp 97.9°F | Ht 70.0 in | Wt 171.4 lb

## 2023-05-25 DIAGNOSIS — R2681 Unsteadiness on feet: Secondary | ICD-10-CM | POA: Diagnosis not present

## 2023-05-25 DIAGNOSIS — S51812A Laceration without foreign body of left forearm, initial encounter: Secondary | ICD-10-CM | POA: Diagnosis not present

## 2023-05-25 DIAGNOSIS — R41841 Cognitive communication deficit: Secondary | ICD-10-CM | POA: Diagnosis not present

## 2023-05-25 DIAGNOSIS — S0033XA Contusion of nose, initial encounter: Secondary | ICD-10-CM | POA: Diagnosis not present

## 2023-05-25 DIAGNOSIS — R2689 Other abnormalities of gait and mobility: Secondary | ICD-10-CM | POA: Diagnosis not present

## 2023-05-25 DIAGNOSIS — S51811A Laceration without foreign body of right forearm, initial encounter: Secondary | ICD-10-CM

## 2023-05-25 DIAGNOSIS — R488 Other symbolic dysfunctions: Secondary | ICD-10-CM | POA: Diagnosis not present

## 2023-05-25 DIAGNOSIS — M1380 Other specified arthritis, unspecified site: Secondary | ICD-10-CM | POA: Diagnosis not present

## 2023-05-25 DIAGNOSIS — M6259 Muscle wasting and atrophy, not elsewhere classified, multiple sites: Secondary | ICD-10-CM | POA: Diagnosis not present

## 2023-05-25 DIAGNOSIS — S0083XA Contusion of other part of head, initial encounter: Secondary | ICD-10-CM | POA: Diagnosis not present

## 2023-05-25 NOTE — Telephone Encounter (Signed)
Final Outcome: See PCP within 2 weeks. I spoke with Clarisse Gouge, access nurse, who states patient informed her he fell one time. Clarisse Gouge also informed me that she ran 2 guidelines for his fall and nose. Outcome for fall is see PCP within 2 weeks and for his possible broken nose outcome is to been seen within 24 hours.   Patient has been scheduled for Dr. Veto Kemps on 05/25/23 @ 2:20pm.  Patient Name First: Allen Robinson Last: Balan Gender: Male DOB: [birth year] Age: 89 Y 3 M 5 D Return Phone Number: [phone] (Primary) Address: City/ State/ Zip: [place]  [address] Client [facility] - Administrator, [facility] at [facility] Provider Tana Conch- MD Contact Type Call Who Is Calling Patient / Member / Family / Caregiver Call Type Triage / Clinical Caller Name Allen Robinson Relationship To Patient Spouse Return Phone Number [phone] (Primary) Chief Complaint HEAD INJURY - and not acting right. Change in behaviour after hitting head. Reason for Call Symptomatic / Request for Health Information Initial Comment Caller states has fallen 3 times this week. He nose injury looks broken and abrasions on nose. He hit head . Tripped on a small step. Translation No Nurse Assessment Nurse: Tennis Ship, RN, Clarisse Gouge Date/Time (Eastern Time): 05/25/2023 10:39:15 AM Confirm and document reason for call. If symptomatic, describe symptoms. ---Caller states husband fell Wednesday, third time this week, hit his nose and scraped his forehead and both elbows and arms. Does the patient have any new or worsening symptoms? ---Yes Will a triage be completed? ---Yes Related visit to physician within the last 2 weeks? ---No Does the PT have any chronic conditions? (i.e. diabetes, asthma, this includes High risk factors for pregnancy, etc.) ---Yes List chronic conditions. ---gout, HLD Is this a behavioral health or substance abuse call?  ---No Guidelines Guideline Title Affirmed Question Affirmed Notes Nurse Date/Time Lamount Cohen Time) Nose Injury Tip of nose is very tender Tennis Ship, RN, Clarisse Gouge 05/25/2023 10:45:30 AM Falls and Falling [1] Falling (two or more falls) AND [2] in past year Lorenza Chick 05/25/2023 10:52:10 AM Disp. Time Lamount Cohen Time) Disposition Final User 05/25/2023 10:35:18 AM Send to Urgent Ned Grace 05/25/2023 10:51:39 AM See PCP within 24 Hours Lorenza Chick 05/25/2023 10:54:53 AM See PCP within 2 Weeks Yes Tennis Ship, RN, Clarisse Gouge Final Disposition 05/25/2023 10:54:53 AM See PCP within 2 Weeks Yes Tennis Ship, RN, Thayer Dallas Disagree/Comply Comply Caller Understands Yes PreDisposition Call Doctor Care Advice Given Per Guideline SEE PCP WITHIN 24 HOURS: * You become worse CARE ADVICE given per Nose Injury (Adult) guideline. SEE PCP WITHIN 2 WEEKS: * You need to be seen for this ongoing problem within the next 2 weeks. CALL BACK IF: * You become worse CARE ADVICE given per Fall and Falling (Adult) guideline. Comments User: Shanon Rosser, RN Date/Time Lamount Cohen Time): 05/25/2023 11:07:36 AM Caller transferred to office for appointment today. Referrals REFERRED TO PCP OFFICE

## 2023-05-25 NOTE — Progress Notes (Signed)
[facility] [address] [place] [address] Dept: [phone] Dept Fax: [phone]  Office Visit  Subjective:    Patient ID: Allen Robinson, male    DOB: [birth year], 89 y.o..   MRN: [id number]  Chief Complaint  Patient presents with   Fall    C/o having a fall hitting nose on concrete floor x 2 days ago.    History of Present Illness:  Patient is in today for evaluation of injuries secondary to a fall 3-days ago. Allen Robinson lives in a retirement home. Three days ago, he was carrying an Engineer, maintenance (IT). He tripped over the threshold, landing on his forearms and striking his face on the concrete floor. He did not have LOC. He did experience quite a bit of bleeding from his nose. The director of the facility contacted EMS, who evaluated him, but did not transport him to the hospital. His wife assisted him in dressing skin tears on both his forearms. He notes he did blow some dark blood out of his right nostril this morning. He notes he had previously broken his nose as a kid playing hokey in [place]. He had surgery at one point for a deviated septum. He is engaged in physical and occupational therapy to try and prevent falls. He is also using a 4-wheeled walker since the fall.  Past Medical History: Patient Active Problem List   Diagnosis Date Noted   Senile purpura (HCC) 05/03/2020   Aortic atherosclerosis (HCC) 05/21/2019   Right rib fracture 05/15/2019   Hyperglycemia 04/28/2019   Gout 03/14/2017   Vitamin D deficiency 03/14/2017   BPH associated with nocturia 03/14/2017   Hyperlipidemia 03/14/2017   GERD (gastroesophageal reflux disease) 03/14/2017   Peripheral polyneuropathy 02/16/2016   Diplopia 11/19/2015   Past Surgical History:  Procedure Laterality Date   TONSILLECTOMY     1936   Family History  Problem Relation Age of Onset   Heart failure Mother    Pneumonia Mother        died 10   Stroke  Father        21   Neuropathy Neg Hx    Outpatient Medications Prior to Visit  Medication Sig Dispense Refill   allopurinol (ZYLOPRIM) 100 MG tablet TAKE TWO TABLETS BY MOUTH DAILY TO PREVENT GOUT, NOT FOR ACUTE ATTACKS, MUST TAKE EVERY DAY     colchicine 0.6 MG tablet Take 0.6 mg by mouth as needed.     finasteride (PROSCAR) 5 MG tablet Take 1 tablet by mouth daily.     Multiple Vitamin (MULTIVITAMIN ADULT PO) Take by mouth.     omeprazole (PRILOSEC) 20 MG capsule Take 20 mg by mouth daily as needed.      simvastatin (ZOCOR) 40 MG tablet Take 20 mg by mouth at bedtime.      terazosin (HYTRIN) 2 MG capsule Take 2 mg by mouth at bedtime. (Patient not taking: Reported on 05/25/2023)     Vitamin D, Ergocalciferol, (DRISDOL) 1.25 MG (50000 UNIT) CAPS capsule Take 1 capsule (50,000 Units total) by mouth every 7 (seven) days. 12 capsule 0   No facility-administered medications prior to visit.   No Known Allergies   Objective:   Today's Vitals   05/25/23 1421  BP: 130/74  Pulse: 60  Temp: 97.9 F (36.6 C)  TempSrc: Temporal  SpO2: 97%  Weight: 171 lb 6.4 oz (77.7 kg)  Height: 5\' 10"  (1.778 m)   Body mass index is 24.59 kg/m.  General: Well developed, well nourished. No acute distress. HEENT: Normocephalic. PERRL, EOMI. Conjunctiva clear. There is a large are of ecchymosis   below the left eye and towards the side of the nose. NO deformity noted and no tenderness   over the zygomatic arch or sinus.  The nose is obviously deformed, shifted to the right near the   nasal bridge. There is no significant tenderness to palpation of the bony aspect of the nose.   There is a large clot in the left nostril, but the right appears clear.  Extremities: Full ROM. No joint swelling or tenderness. There are superficial skin tears of boht   forearms. These are clean without sign of infection. Psych: Alert and oriented. Normal mood and affect.  Health Maintenance Due  Topic Date Due   Zoster  Vaccines- Shingrix (1 of 2) Never done     Assessment & Plan:   Problem List Items Addressed This Visit   None Visit Diagnoses     Contusion of face, initial encounter    -  Primary   Will manage expectantly. No sing of facial fracture or intracranial injury.   Contusion of nose, initial encounter- suspect fracture       I recommend referral to ENT for management, to consider if resetting the nose is needed.   Relevant Orders   Ambulatory referral to ENT   Skin tear of forearm without complication, right, initial encounter       Cleaned wound and replaced dressings. Reviewed wound care wiht patient and his wife.   Skin tear of forearm without complication, left, initial encounter       Cleaned wound and replaced dressings. Reviewed wound care wiht patient and his wife.       No follow-ups on file.   Loyola Mast, MD

## 2023-05-25 NOTE — Telephone Encounter (Addendum)
..  FYI: This call has been transferred to triage nurse: Access Nurse. Once the result note has been entered staff can address the message at that time.  Patient called in with the following symptoms:  Red Word:fall 3x this week and possible broken nose w/ wounds on both arms   Please advise at Mobile [phone] (mobile)  Message is routed to Provider Pool.

## 2023-05-28 DIAGNOSIS — M6259 Muscle wasting and atrophy, not elsewhere classified, multiple sites: Secondary | ICD-10-CM | POA: Diagnosis not present

## 2023-05-28 DIAGNOSIS — R2689 Other abnormalities of gait and mobility: Secondary | ICD-10-CM | POA: Diagnosis not present

## 2023-05-28 DIAGNOSIS — R2681 Unsteadiness on feet: Secondary | ICD-10-CM | POA: Diagnosis not present

## 2023-05-28 DIAGNOSIS — R41841 Cognitive communication deficit: Secondary | ICD-10-CM | POA: Diagnosis not present

## 2023-05-28 DIAGNOSIS — R488 Other symbolic dysfunctions: Secondary | ICD-10-CM | POA: Diagnosis not present

## 2023-05-28 DIAGNOSIS — M1380 Other specified arthritis, unspecified site: Secondary | ICD-10-CM | POA: Diagnosis not present

## 2023-05-29 DIAGNOSIS — R41841 Cognitive communication deficit: Secondary | ICD-10-CM | POA: Diagnosis not present

## 2023-05-29 DIAGNOSIS — R2681 Unsteadiness on feet: Secondary | ICD-10-CM | POA: Diagnosis not present

## 2023-05-29 DIAGNOSIS — R488 Other symbolic dysfunctions: Secondary | ICD-10-CM | POA: Diagnosis not present

## 2023-05-29 DIAGNOSIS — R2689 Other abnormalities of gait and mobility: Secondary | ICD-10-CM | POA: Diagnosis not present

## 2023-05-29 DIAGNOSIS — M1380 Other specified arthritis, unspecified site: Secondary | ICD-10-CM | POA: Diagnosis not present

## 2023-05-29 DIAGNOSIS — M6259 Muscle wasting and atrophy, not elsewhere classified, multiple sites: Secondary | ICD-10-CM | POA: Diagnosis not present

## 2023-05-30 ENCOUNTER — Emergency Department (HOSPITAL_BASED_OUTPATIENT_CLINIC_OR_DEPARTMENT_OTHER)
Admission: EM | Admit: 2023-05-30 | Discharge: 2023-05-30 | Disposition: A | Discharge status: Home / Self Care | Payer: Medicare Other | Attending: Emergency Medicine | Admitting: Emergency Medicine

## 2023-05-30 ENCOUNTER — Other Ambulatory Visit: Payer: Self-pay

## 2023-05-30 ENCOUNTER — Ambulatory Visit: Payer: Self-pay

## 2023-05-30 ENCOUNTER — Encounter (HOSPITAL_BASED_OUTPATIENT_CLINIC_OR_DEPARTMENT_OTHER): Payer: Self-pay | Admitting: Emergency Medicine

## 2023-05-30 DIAGNOSIS — U071 COVID-19: Secondary | ICD-10-CM | POA: Insufficient documentation

## 2023-05-30 DIAGNOSIS — R488 Other symbolic dysfunctions: Secondary | ICD-10-CM | POA: Diagnosis not present

## 2023-05-30 DIAGNOSIS — M6259 Muscle wasting and atrophy, not elsewhere classified, multiple sites: Secondary | ICD-10-CM | POA: Diagnosis not present

## 2023-05-30 DIAGNOSIS — R2689 Other abnormalities of gait and mobility: Secondary | ICD-10-CM | POA: Diagnosis not present

## 2023-05-30 DIAGNOSIS — R2681 Unsteadiness on feet: Secondary | ICD-10-CM | POA: Diagnosis not present

## 2023-05-30 DIAGNOSIS — M1380 Other specified arthritis, unspecified site: Secondary | ICD-10-CM | POA: Diagnosis not present

## 2023-05-30 DIAGNOSIS — R059 Cough, unspecified: Secondary | ICD-10-CM | POA: Diagnosis present

## 2023-05-30 DIAGNOSIS — R41841 Cognitive communication deficit: Secondary | ICD-10-CM | POA: Diagnosis not present

## 2023-05-30 MED ORDER — MOLNUPIRAVIR EUA 200MG CAPSULE: 4.0000 | ORAL_CAPSULE | Freq: Two times a day (BID) | ORAL | 0 refills | Status: AC

## 2023-05-30 NOTE — ED Provider Notes (Signed)
[facility] EMERGENC[facility] DEPARTMENT AT [facility] Provider Note   CSN: [id number] Arrival date & time: 05/30/23  1652     History  Chief Complaint  Patient presents with   Covid Positive    Allen Robinson is a 89 y.o. male with a history of polyneuropathy, gout, BPH, and hyperlipidemia who presents to the ED today with his wife at bedside after testing positive for COVID.  Patient reports having a cough but denies fever, chills, nausea, vomiting, diarrhea, or abdominal pain.  No shortness of breath or chest pain. Patient is requesting to get started on medication to reduce his symptoms of COVID.  No other complaints or concerns at this time.    Home Medications Prior to Admission medications   Medication Sig Start Date End Date Taking? Authorizing Provider  molnupiravir EUA (LAGEVRIO) 200 mg CAPS capsule Take 4 capsules (800 mg total) by mouth 2 (two) times daily for 5 days. 05/30/23 06/04/23 Yes Maxwell Marion, PA-C  allopurinol (ZYLOPRIM) 100 MG tablet TAKE TWO TABLETS BY MOUTH DAILY TO PREVENT GOUT, NOT FOR ACUTE ATTACKS, MUST TAKE EVERY DAY 02/24/22   [provider]  colchicine 0.6 MG tablet Take 0.6 mg by mouth as needed.    [provider]  finasteride (PROSCAR) 5 MG tablet Take 1 tablet by mouth daily. 02/24/22   [provider]  Multiple Vitamin (MULTIVITAMIN ADULT PO) Take by mouth.    [provider]  omeprazole (PRILOSEC) 20 MG capsule Take 20 mg by mouth daily as needed.     [provider]  simvastatin (ZOCOR) 40 MG tablet Take 20 mg by mouth at bedtime.     [provider]  terazosin (HYTRIN) 2 MG capsule Take 2 mg by mouth at bedtime. Patient not taking: Reported on 05/25/2023    [provider]      Allergies    Patient has no known allergies.    Review of Systems   Review of Systems  Respiratory:  Positive for cough.     Physical Exam Updated Vital Signs BP 127/73 (BP Location: Left Arm)    Pulse 68   Temp 98 F (36.7 C)   Resp 18   Ht 5\' 10"  (1.778 m)   Wt 77.6 kg   SpO2 96%   BMI 24.54 kg/m  Physical Exam  ED Results / Procedures / Treatments   Labs (all labs ordered are listed, but only abnormal results are displayed) Labs Reviewed - No data to display  EKG None  Radiology No results found.  Procedures Procedures: not indicated.   Medications Ordered in ED Medications - No data to display  ED Course/ Medical Decision Making/ A&P                             Medical Decision Making  Patient is a 89 year old male who presents to the ED today after testing Positive for COVID.  He is requesting medication to help with the symptoms.  He reports cough but no other symptoms.  Physical exam is unremarkable.  Lungs are clear to auscultation bilaterally.  Patient is afebrile and nontoxic-appearing.   Patient does not have a recent GFR so Molnupiravir was sent to his pharmacy. Patient is stable and safe for discharge home.  Return precautions provided.       Final Clinical Impression(s) / ED Diagnoses Final diagnoses:  COVID    Rx / DC Orders ED Discharge Orders  Ordered    molnupiravir EUA (LAGEVRIO) 200 mg CAPS capsule  2 times daily        05/30/23 1738              Maxwell Marion, PA-C 05/31/23 2349    Arby Barrette, MD 06/08/23 1053

## 2023-05-30 NOTE — Telephone Encounter (Signed)
Patient test positive for Covid today with symptoms of running nose, cough and weakness. Spouse is asking for medication as she is not able to get him to his PCP until tomorrow.   Chief Complaint: COVID positive. Cough, weak, runny nose. Symptoms: Above Frequency: Yesterday Pertinent Negatives: Patient denies fever, no SOB Disposition: [] ED /[] Urgent Care (no appt availability in office) / [] Appointment(In office/virtual)/ []  [facility]/ [] Home Care/ [] Refused Recommended Disposition /[] [facility]/ [x]  Follow-up with PCP Additional Notes: Has appointment tomorrow with PCP. Go to ED for worsening of symptoms. Reviewed Home Care.  Reason for Disposition  [1] HIGH RISK patient (e.g., weak immune system, age > 64 years, obesity with BMI 30 or higher, pregnant, chronic lung disease or other chronic medical condition) AND [2] COVID symptoms (e.g., cough, fever)  (Exceptions: Already seen by PCP and no new or worsening symptoms.)  Answer Assessment - Initial Assessment Questions 1. COVID-19 DIAGNOSIS: "How do you know that you have COVID?" (e.g., positive lab test or self-test, diagnosed by doctor or NP/PA, symptoms after exposure).     Test 2. COVID-19 EXPOSURE: "Was there any known exposure to COVID before the symptoms began?" CDC Definition of close contact: within 6 feet (2 meters) for a total of 15 minutes or more over a 24-hour period.      N/a 3. ONSET: "When did the COVID-19 symptoms start?"      Yesterday 4. WORST SYMPTOM: "What is your worst symptom?" (e.g., cough, fever, shortness of breath, muscle aches)     Cough, runny nose 5. COUGH: "Do you have a cough?" If Yes, ask: "How bad is the cough?"       Yes 6. FEVER: "Do you have a fever?" If Yes, ask: "What is your temperature, how was it measured, and when did it start?"     No 7. RESPIRATORY STATUS: "Describe your breathing?" (e.g., normal; shortness of breath, wheezing, unable to speak)      No 8.  BETTER-SAME-WORSE: "Are you getting better, staying the same or getting worse compared to yesterday?"  If getting worse, ask, "In what way?"     Same 9. OTHER SYMPTOMS: "Do you have any other symptoms?"  (e.g., chills, fatigue, headache, loss of smell or taste, muscle pain, sore throat)     Weakness 10. HIGH RISK DISEASE: "Do you have any chronic medical problems?" (e.g., asthma, heart or lung disease, weak immune system, obesity, etc.)       N/a 11. VACCINE: "Have you had the COVID-19 vaccine?" If Yes, ask: "Which one, how many shots, when did you get it?"       N/a 12. PREGNANCY: "Is there any chance you are pregnant?" "When was your last menstrual period?"       N/a 13. O2 SATURATION MONITOR:  "Do you use an oxygen saturation monitor (pulse oximeter) at home?" If Yes, ask "What is your reading (oxygen level) today?" "What is your usual oxygen saturation reading?" (e.g., 95%)       No  Protocols used: Coronavirus (COVID-19) Diagnosed or Suspected-A-AH

## 2023-05-30 NOTE — Discharge Instructions (Signed)
You have been prescribed Molnupiravir. Take 2 tablets a day for 5 days. This prescription has been sent to your pharmacy.  Follow up with your PCP in 1 week to reevaluate symptoms.  Get help right away if: You have trouble breathing or get short of breath. You have pain or pressure in your chest. You cannot speak or move any part of your body. You are confused. Your symptoms get worse.

## 2023-05-30 NOTE — ED Triage Notes (Signed)
Patient arrives in wheelchair by POV with spouse stating patient tested positive for covid this morning and is requesting to be started on medication for it. Denies any pain. C/o cough.

## 2023-05-31 ENCOUNTER — Ambulatory Visit: Payer: Medicare Other | Admitting: Family

## 2023-06-05 ENCOUNTER — Ambulatory Visit: Payer: Medicare Other | Admitting: Physician Assistant

## 2023-06-05 ENCOUNTER — Ambulatory Visit (INDEPENDENT_AMBULATORY_CARE_PROVIDER_SITE_OTHER)
Admission: RE | Admit: 2023-06-05 | Discharge: 2023-06-05 | Disposition: A | Discharge status: Home / Self Care | Payer: Medicare Other | Source: Ambulatory Visit | Attending: Physician Assistant | Admitting: Physician Assistant

## 2023-06-05 ENCOUNTER — Ambulatory Visit: Payer: Medicare Other | Admitting: Family Medicine

## 2023-06-05 ENCOUNTER — Telehealth: Payer: Self-pay

## 2023-06-05 ENCOUNTER — Encounter: Payer: Self-pay | Admitting: Physician Assistant

## 2023-06-05 VITALS — BP 122/74 | HR 64 | Ht 70.0 in | Wt 165.8 lb

## 2023-06-05 DIAGNOSIS — R058 Other specified cough: Secondary | ICD-10-CM

## 2023-06-05 DIAGNOSIS — U071 COVID-19: Secondary | ICD-10-CM | POA: Diagnosis not present

## 2023-06-05 DIAGNOSIS — I700 Atherosclerosis of aorta: Secondary | ICD-10-CM | POA: Diagnosis not present

## 2023-06-05 NOTE — Progress Notes (Signed)
Subjective:    Patient ID: Allen Robinson, male    DOB: [birth year], 89 y.o.   MRN: [id number]  Chief Complaint  Patient presents with   Medical Management of Chronic Issues    Persistent Cough, has been a problem for weeks, prior treatment molnupiravir 200 mg, taking 4 pills twice daily for 5 days. Has completed course of treatment     HPI Patient is in today for ED f/up COVID-19 and cough. Here with daughter.   Symptoms of COVID-19 started on 05/30/23, couldn't get into our office and went to ER.   Runny nose & cough, productive cough. Sinus drainage. Symptoms about the same, no better or worse since Molnupiravir.   Drinking water, not taking anything else for cough. Not constant or too disruptive to sleep.   No fever or chills. Denies any SOB or CP. Appetite has been good overall.  Past Medical History:  Diagnosis Date   Arthritis    hands   Chicken pox    Hyperlipidemia    Pneumonia    Seasonal allergies     Past Surgical History:  Procedure Laterality Date   TONSILLECTOMY     34    Family History  Problem Relation Age of Onset   Heart failure Mother    Pneumonia Mother        died 56   Stroke Father        46   Neuropathy Neg Hx     Social History   Tobacco Use   Smoking status: Never   Smokeless tobacco: Never  Substance Use Topics   Alcohol use: Yes    Alcohol/week: 0.0 standard drinks of alcohol    Comment: no longer drinking. prior social   Drug use: No     No Known Allergies  Review of Systems NEGATIVE UNLESS OTHERWISE INDICATED IN HPI      Objective:     BP 122/74 (BP Location: Right Arm, Patient Position: Sitting, Cuff Size: Normal)   Pulse 64   Ht 5\' 10"  (1.778 m)   Wt 165 lb 12.8 oz (75.2 kg)   SpO2 96%   BMI 23.79 kg/m   Wt Readings from Last 3 Encounters:  06/05/23 165 lb 12.8 oz (75.2 kg)  05/30/23 171 lb (77.6 kg)  05/25/23 171 lb 6.4 oz (77.7 kg)    BP Readings from Last 3 Encounters:  06/05/23 122/74   05/30/23 127/73  05/25/23 130/74     Physical Exam Vitals and nursing note reviewed.  Constitutional:      General: He is not in acute distress.    Appearance: Normal appearance. He is not ill-appearing.     Comments: Wearing a mask   Eyes:     Extraocular Movements: Extraocular movements intact.     Conjunctiva/sclera: Conjunctivae normal.     Pupils: Pupils are equal, round, and reactive to light.  Cardiovascular:     Rate and Rhythm: Normal rate and regular rhythm.     Pulses: Normal pulses.  Pulmonary:     Effort: Pulmonary effort is normal.     Breath sounds: Normal breath sounds. No wheezing.  Musculoskeletal:     Right lower leg: No edema.     Left lower leg: No edema.  Skin:    General: Skin is warm.  Neurological:     General: No focal deficit present.     Mental Status: He is alert.  Psychiatric:        Mood and Affect: Mood normal.  Behavior: Behavior normal.        Assessment & Plan:  COVID-19 -     DG Chest 2 View; Future  Productive cough -     DG Chest 2 View; Future   I personally reviewed patient's ED note from 05/30/23. He has completed Molnupiravir course without much change in his cough. He is well-appearing today, vitals are stable. Chest XRAY completed at our [facility] clinic later today after visit with me, this was CLEAR, without any acute findings. Plan to hold on antibiotics. Will use plain Mucinex and push fluids to help loosen phlegm. ER precautions discussed.  Also advised patient that he can address additional concerns on his list with his PCP as scheduled next month.    Return if symptoms worsen or fail to improve.  Time Spent: 30 minutes of total time was spent on the date of the encounter performing the following actions: chart review prior to seeing the patient, obtaining history, performing a medically necessary exam, counseling on the treatment plan, placing orders, and documenting in our EHR.     Dak Szumski M Elyana Grabski, PA-C

## 2023-06-05 NOTE — Telephone Encounter (Signed)
Transition Care Management Follow-up Telephone Call Date of discharge and from where: 05/30/2023 [facility] How have you been since you were released from the hospital? Patient still has a cough and not feeling much better. Any questions or concerns? No  Items Reviewed: Did the pt receive and understand the discharge instructions provided? Yes  Medications obtained and verified? Yes  Other? No  Any new allergies since your discharge? No  Dietary orders reviewed? Yes Do you have support at home? Yes   Follow up appointments reviewed:  PCP Hospital f/u appt confirmed? Yes  Scheduled to see Ila Mcgill, PA-C on 06/05/2023 @ [facility]. Specialist Hospital f/u appt confirmed? Yes  Scheduled to see Marianna Payment, MD on 06/11/2023 @ [facility]. Are transportation arrangements needed? No  If their condition worsens, is the pt aware to call PCP or go to the Emergency Dept.? Yes Was the patient provided with contact information for the PCP's office or ED? Yes Was to pt encouraged to call back with questions or concerns? Yes  Iven Earnhart [facility]  [facility] Population Health Community Resource Care Guide   [email or web address]  ?? [phone]   Website: [email or web address]

## 2023-06-05 NOTE — Patient Instructions (Addendum)
[facility] L[facility] [address] [place], [place] [address] Tel: [phone] Hours: M-F 8:30 am - 5:00 pm Closed for lunch 12:30 pm - 1:00 pm  I will contact you with results, treat pending any abnormality.  Be sure to push fluids, use plain Mucinex to help with productive cough.  Back to ER if any chest pain, short of breath, severe weak or dizzy, etc.

## 2023-06-06 DIAGNOSIS — R2681 Unsteadiness on feet: Secondary | ICD-10-CM | POA: Diagnosis not present

## 2023-06-06 DIAGNOSIS — R41841 Cognitive communication deficit: Secondary | ICD-10-CM | POA: Diagnosis not present

## 2023-06-06 DIAGNOSIS — R2689 Other abnormalities of gait and mobility: Secondary | ICD-10-CM | POA: Diagnosis not present

## 2023-06-06 DIAGNOSIS — R488 Other symbolic dysfunctions: Secondary | ICD-10-CM | POA: Diagnosis not present

## 2023-06-06 DIAGNOSIS — M6259 Muscle wasting and atrophy, not elsewhere classified, multiple sites: Secondary | ICD-10-CM | POA: Diagnosis not present

## 2023-06-06 DIAGNOSIS — M1380 Other specified arthritis, unspecified site: Secondary | ICD-10-CM | POA: Diagnosis not present

## 2023-06-07 DIAGNOSIS — R2689 Other abnormalities of gait and mobility: Secondary | ICD-10-CM | POA: Diagnosis not present

## 2023-06-07 DIAGNOSIS — R488 Other symbolic dysfunctions: Secondary | ICD-10-CM | POA: Diagnosis not present

## 2023-06-07 DIAGNOSIS — R2681 Unsteadiness on feet: Secondary | ICD-10-CM | POA: Diagnosis not present

## 2023-06-07 DIAGNOSIS — M6259 Muscle wasting and atrophy, not elsewhere classified, multiple sites: Secondary | ICD-10-CM | POA: Diagnosis not present

## 2023-06-07 DIAGNOSIS — M1380 Other specified arthritis, unspecified site: Secondary | ICD-10-CM | POA: Diagnosis not present

## 2023-06-07 DIAGNOSIS — R41841 Cognitive communication deficit: Secondary | ICD-10-CM | POA: Diagnosis not present

## 2023-06-08 DIAGNOSIS — R2681 Unsteadiness on feet: Secondary | ICD-10-CM | POA: Diagnosis not present

## 2023-06-08 DIAGNOSIS — M1380 Other specified arthritis, unspecified site: Secondary | ICD-10-CM | POA: Diagnosis not present

## 2023-06-08 DIAGNOSIS — R41841 Cognitive communication deficit: Secondary | ICD-10-CM | POA: Diagnosis not present

## 2023-06-08 DIAGNOSIS — M6259 Muscle wasting and atrophy, not elsewhere classified, multiple sites: Secondary | ICD-10-CM | POA: Diagnosis not present

## 2023-06-08 DIAGNOSIS — R488 Other symbolic dysfunctions: Secondary | ICD-10-CM | POA: Diagnosis not present

## 2023-06-08 DIAGNOSIS — R2689 Other abnormalities of gait and mobility: Secondary | ICD-10-CM | POA: Diagnosis not present

## 2023-06-11 ENCOUNTER — Institutional Professional Consult (permissible substitution) (INDEPENDENT_AMBULATORY_CARE_PROVIDER_SITE_OTHER): Payer: Medicare Other | Admitting: Otolaryngology

## 2023-06-11 DIAGNOSIS — R488 Other symbolic dysfunctions: Secondary | ICD-10-CM | POA: Diagnosis not present

## 2023-06-11 DIAGNOSIS — R2689 Other abnormalities of gait and mobility: Secondary | ICD-10-CM | POA: Diagnosis not present

## 2023-06-11 DIAGNOSIS — R41841 Cognitive communication deficit: Secondary | ICD-10-CM | POA: Diagnosis not present

## 2023-06-11 DIAGNOSIS — R2681 Unsteadiness on feet: Secondary | ICD-10-CM | POA: Diagnosis not present

## 2023-06-11 DIAGNOSIS — M1380 Other specified arthritis, unspecified site: Secondary | ICD-10-CM | POA: Diagnosis not present

## 2023-06-11 DIAGNOSIS — M6259 Muscle wasting and atrophy, not elsewhere classified, multiple sites: Secondary | ICD-10-CM | POA: Diagnosis not present

## 2023-06-12 DIAGNOSIS — R2681 Unsteadiness on feet: Secondary | ICD-10-CM | POA: Diagnosis not present

## 2023-06-12 DIAGNOSIS — M1380 Other specified arthritis, unspecified site: Secondary | ICD-10-CM | POA: Diagnosis not present

## 2023-06-12 DIAGNOSIS — R488 Other symbolic dysfunctions: Secondary | ICD-10-CM | POA: Diagnosis not present

## 2023-06-12 DIAGNOSIS — M6259 Muscle wasting and atrophy, not elsewhere classified, multiple sites: Secondary | ICD-10-CM | POA: Diagnosis not present

## 2023-06-12 DIAGNOSIS — R41841 Cognitive communication deficit: Secondary | ICD-10-CM | POA: Diagnosis not present

## 2023-06-12 DIAGNOSIS — R2689 Other abnormalities of gait and mobility: Secondary | ICD-10-CM | POA: Diagnosis not present

## 2023-06-13 DIAGNOSIS — R2689 Other abnormalities of gait and mobility: Secondary | ICD-10-CM | POA: Diagnosis not present

## 2023-06-13 DIAGNOSIS — M6259 Muscle wasting and atrophy, not elsewhere classified, multiple sites: Secondary | ICD-10-CM | POA: Diagnosis not present

## 2023-06-13 DIAGNOSIS — R488 Other symbolic dysfunctions: Secondary | ICD-10-CM | POA: Diagnosis not present

## 2023-06-13 DIAGNOSIS — M1380 Other specified arthritis, unspecified site: Secondary | ICD-10-CM | POA: Diagnosis not present

## 2023-06-13 DIAGNOSIS — R41841 Cognitive communication deficit: Secondary | ICD-10-CM | POA: Diagnosis not present

## 2023-06-13 DIAGNOSIS — R2681 Unsteadiness on feet: Secondary | ICD-10-CM | POA: Diagnosis not present

## 2023-06-14 ENCOUNTER — Ambulatory Visit (INDEPENDENT_AMBULATORY_CARE_PROVIDER_SITE_OTHER): Payer: Medicare Other | Admitting: Family Medicine

## 2023-06-14 ENCOUNTER — Encounter: Payer: Self-pay | Admitting: Family Medicine

## 2023-06-14 VITALS — BP 120/70 | HR 71 | Temp 97.6°F | Ht 70.0 in | Wt 166.8 lb

## 2023-06-14 DIAGNOSIS — E785 Hyperlipidemia, unspecified: Secondary | ICD-10-CM

## 2023-06-14 DIAGNOSIS — R2689 Other abnormalities of gait and mobility: Secondary | ICD-10-CM

## 2023-06-14 DIAGNOSIS — R739 Hyperglycemia, unspecified: Secondary | ICD-10-CM | POA: Diagnosis not present

## 2023-06-14 DIAGNOSIS — R41841 Cognitive communication deficit: Secondary | ICD-10-CM | POA: Diagnosis not present

## 2023-06-14 DIAGNOSIS — E559 Vitamin D deficiency, unspecified: Secondary | ICD-10-CM

## 2023-06-14 DIAGNOSIS — M6259 Muscle wasting and atrophy, not elsewhere classified, multiple sites: Secondary | ICD-10-CM | POA: Diagnosis not present

## 2023-06-14 DIAGNOSIS — M1380 Other specified arthritis, unspecified site: Secondary | ICD-10-CM | POA: Diagnosis not present

## 2023-06-14 DIAGNOSIS — R413 Other amnesia: Secondary | ICD-10-CM | POA: Diagnosis not present

## 2023-06-14 DIAGNOSIS — R2681 Unsteadiness on feet: Secondary | ICD-10-CM | POA: Diagnosis not present

## 2023-06-14 DIAGNOSIS — I700 Atherosclerosis of aorta: Secondary | ICD-10-CM | POA: Diagnosis not present

## 2023-06-14 DIAGNOSIS — R488 Other symbolic dysfunctions: Secondary | ICD-10-CM | POA: Diagnosis not present

## 2023-06-14 NOTE — Addendum Note (Signed)
Addended by: Shelva Majestic on: 06/14/2023 09:53 AM   Modules accepted: Orders

## 2023-06-14 NOTE — Patient Instructions (Addendum)
Let us know if you get your [facility] or COVID vaccine at the pharmacy.  Please stop by desk to add your daughter to the designated party release  ongoing balance issues  for years (fortunately using walker now and now falls) and progressive memory issues in last 6 months - we opted to refer to neurology for their expert opinion.   We have placed a referral for you today to neurology. In some cases you will see # listed below- you can call this if you have not heard within a week. If you do not see # listed- you should receive a mychart message or phone call within a week with the # to call. Reach out to [place] if you are not scheduled within 2 weeks  Recommended follow up: Return in about 4 months (around 10/15/2023) for followup or sooner if needed.Schedule b4 you leave.

## 2023-06-14 NOTE — Progress Notes (Signed)
Phone [phone] In person visit   Subjective:   Allen Robinson is a 89 y.o. year old very pleasant male patient who presents for/with See problem oriented charting Chief Complaint  Patient presents with   Medical Management of Chronic Issues    [facility]   Hyperlipidemia   Fall    Pt c/o falling 4 times in the past month.   Memory Loss   Past Medical History-  Patient Active Problem List   Diagnosis Date Noted   Aortic atherosclerosis (HCC) 05/21/2019    Priority: Medium    Gout 03/14/2017    Priority: Medium    BPH associated with nocturia 03/14/2017    Priority: Medium    Hyperlipidemia 03/14/2017    Priority: Medium    GERD (gastroesophageal reflux disease) 03/14/2017    Priority: Medium    Senile purpura (HCC) 05/03/2020    Priority: Low   Right rib fracture 05/15/2019    Priority: Low   Hyperglycemia 04/28/2019    Priority: Low   Vitamin D deficiency 03/14/2017    Priority: Low   Peripheral polyneuropathy 02/16/2016    Priority: Low   Diplopia 11/19/2015    Priority: Low    Medications- reviewed and updated Current Outpatient Medications  Medication Sig Dispense Refill   allopurinol (ZYLOPRIM) 100 MG tablet TAKE TWO TABLETS BY MOUTH DAILY TO PREVENT GOUT, NOT FOR ACUTE ATTACKS, MUST TAKE EVERY DAY     colchicine 0.6 MG tablet Take 0.6 mg by mouth as needed.     finasteride (PROSCAR) 5 MG tablet Take 1 tablet by mouth daily.     Multiple Vitamin (MULTIVITAMIN ADULT PO) Take by mouth.     omeprazole (PRILOSEC) 20 MG capsule Take 20 mg by mouth daily as needed.      simvastatin (ZOCOR) 40 MG tablet Take 20 mg by mouth at bedtime.      terazosin (HYTRIN) 2 MG capsule Take 2 mg by mouth at bedtime. (Patient not taking: Reported on 05/25/2023)     No current facility-administered medications for this visit.     Objective:  BP 120/70   Pulse 71   Temp 97.6 F (36.4 C)   Ht 5\' 10"  (1.778 m)   Wt 166 lb 12.8 oz (75.7 kg)   SpO2 96%    BMI 23.93 kg/m  Gen: NAD, resting comfortably CV: RRR no murmurs rubs or gallops Lungs: CTAB no crackles, wheeze, rhonchi Ext: trace edema- consider compression stockings Skin: warm, dry Neuro: walks with walker    Assessment and Plan   # Balance Issues  #memory concern S: proprioception issues with neuropathy contribute, cannot go up stairs safely without railings. -[facility]. DID better with [facility]ot[facility] Shames and he is using cane more regularly in 2023 and 2024 -[facility]'s-  like DAT from June 21, 2018 - no evidence of parkinsons - after having 4 falls in last month started with walker consistently and no falls since that time  Last visit reported mild memory issues such as less sense of direction without getting lost- travels with wife. Wife has noted more issues with finances/bills etc . Daughter wrote a note and mentioned significant short term memory issues in last 2 months even prior to COVID. She also noted strength and physical declines. Notes hand tremor at times at home- making difficult to eat. Some incontinence issues. Seems to drag left foot at times. He  seems more anxious. Struggles to get out of chair. Declines physical therapy for now. Plans to get left chair- declines me writing for that  Did recently have COVID which could contribute- some lingering cough. No shortness of breath or fever.   A/P: ongoing balance issues  for years (fortunately using walker now and now falls) and progressive memory issues in last 6 months - we opted to refer to neurology for their expert opinion.  - we also will give him handicap parking placard permanent - continue walker regularly  -considered B12, TSH- but has had recent labs and he opts out for now -since we were going to refer to neurology regardless we opted out of mmse today  # Hyperlipidemia/aortic atherosclerosis - incidental finding on prior imaging with ideal LDL  goal under 70 S:Medication - Simvastatin 40 mg Lab Results  Component Value Date   CHOL 153 02/14/2022   HDL 44 02/20/2023   LDLCALC 89 02/20/2023   TRIG 90 02/20/2023   CHOLHDL 3 11/03/2021  A/P: LDL at 89 is reasonable for his age- continue current medications- do not feel strongly even with aortic atherosclerosis about pushing under 70   #Gout-uric acid has been below 6 S: 0 flares in last 6 months on Allopurinol 100 mg tablet.  Has colchicine available if needed A/P:no recent flares- continue current medications     # GERD S:Medication -  Omeprazole 20 mg - no issues A/P: stable- continue current medicines    #BPH S: Medication:- Finasteride 5 mg from the [place], terazosin 2 mg. Some incontinence- denis singificant change in balance when going from sitting to standing - prior on Vesicare -Nocturia - gets up 3x a night to urinate. A/P: stable- continue current medicines - considered stopping terazosin but balance issues do not seem primarily orthostatic   # Hyperglycemia/insulin resistance/prediabetes-peak recent A1c 6.3 in 2020 S: Exercise and diet - exercise limited by balance- weight healthy Lab Results  Component Value Date   HGBA1C 6.2 02/20/2023   HGBA1C 6.0 02/14/2022   HGBA1C 6.3 11/03/2021   A/P: prediabetes noted- continue to monitor    Recommended follow up: Return in about 4 months (around 10/15/2023) for followup or sooner if needed.Schedule b4 you leave.  Lab/Order associations:   ICD-10-CM   1. Hyperlipidemia, unspecified hyperlipidemia type  E78.5     2. Aortic atherosclerosis (HCC)  I70.0     3. Hyperglycemia  R73.9     4. Vitamin D deficiency  E55.9      No orders of the defined types were placed in this encounter.  Return precautions advised.  Tana Conch, MD

## 2023-06-15 DIAGNOSIS — R2689 Other abnormalities of gait and mobility: Secondary | ICD-10-CM | POA: Diagnosis not present

## 2023-06-15 DIAGNOSIS — M6259 Muscle wasting and atrophy, not elsewhere classified, multiple sites: Secondary | ICD-10-CM | POA: Diagnosis not present

## 2023-06-15 DIAGNOSIS — R2681 Unsteadiness on feet: Secondary | ICD-10-CM | POA: Diagnosis not present

## 2023-06-15 DIAGNOSIS — R488 Other symbolic dysfunctions: Secondary | ICD-10-CM | POA: Diagnosis not present

## 2023-06-15 DIAGNOSIS — R41841 Cognitive communication deficit: Secondary | ICD-10-CM | POA: Diagnosis not present

## 2023-06-15 DIAGNOSIS — M1380 Other specified arthritis, unspecified site: Secondary | ICD-10-CM | POA: Diagnosis not present

## 2023-06-18 DIAGNOSIS — R2681 Unsteadiness on feet: Secondary | ICD-10-CM | POA: Diagnosis not present

## 2023-06-18 DIAGNOSIS — R2689 Other abnormalities of gait and mobility: Secondary | ICD-10-CM | POA: Diagnosis not present

## 2023-06-18 DIAGNOSIS — R488 Other symbolic dysfunctions: Secondary | ICD-10-CM | POA: Diagnosis not present

## 2023-06-18 DIAGNOSIS — R41841 Cognitive communication deficit: Secondary | ICD-10-CM | POA: Diagnosis not present

## 2023-06-18 DIAGNOSIS — M1380 Other specified arthritis, unspecified site: Secondary | ICD-10-CM | POA: Diagnosis not present

## 2023-06-18 DIAGNOSIS — M6259 Muscle wasting and atrophy, not elsewhere classified, multiple sites: Secondary | ICD-10-CM | POA: Diagnosis not present

## 2023-06-19 DIAGNOSIS — M1380 Other specified arthritis, unspecified site: Secondary | ICD-10-CM | POA: Diagnosis not present

## 2023-06-19 DIAGNOSIS — M6259 Muscle wasting and atrophy, not elsewhere classified, multiple sites: Secondary | ICD-10-CM | POA: Diagnosis not present

## 2023-06-19 DIAGNOSIS — R488 Other symbolic dysfunctions: Secondary | ICD-10-CM | POA: Diagnosis not present

## 2023-06-19 DIAGNOSIS — R2689 Other abnormalities of gait and mobility: Secondary | ICD-10-CM | POA: Diagnosis not present

## 2023-06-19 DIAGNOSIS — R2681 Unsteadiness on feet: Secondary | ICD-10-CM | POA: Diagnosis not present

## 2023-06-19 DIAGNOSIS — R41841 Cognitive communication deficit: Secondary | ICD-10-CM | POA: Diagnosis not present

## 2023-06-20 DIAGNOSIS — R41841 Cognitive communication deficit: Secondary | ICD-10-CM | POA: Diagnosis not present

## 2023-06-20 DIAGNOSIS — R2689 Other abnormalities of gait and mobility: Secondary | ICD-10-CM | POA: Diagnosis not present

## 2023-06-20 DIAGNOSIS — R2681 Unsteadiness on feet: Secondary | ICD-10-CM | POA: Diagnosis not present

## 2023-06-20 DIAGNOSIS — M6259 Muscle wasting and atrophy, not elsewhere classified, multiple sites: Secondary | ICD-10-CM | POA: Diagnosis not present

## 2023-06-20 DIAGNOSIS — M1380 Other specified arthritis, unspecified site: Secondary | ICD-10-CM | POA: Diagnosis not present

## 2023-06-20 DIAGNOSIS — R488 Other symbolic dysfunctions: Secondary | ICD-10-CM | POA: Diagnosis not present

## 2023-06-21 ENCOUNTER — Encounter: Payer: Self-pay | Admitting: Physician Assistant

## 2023-06-22 DIAGNOSIS — R41841 Cognitive communication deficit: Secondary | ICD-10-CM | POA: Diagnosis not present

## 2023-06-22 DIAGNOSIS — R488 Other symbolic dysfunctions: Secondary | ICD-10-CM | POA: Diagnosis not present

## 2023-06-22 DIAGNOSIS — M6259 Muscle wasting and atrophy, not elsewhere classified, multiple sites: Secondary | ICD-10-CM | POA: Diagnosis not present

## 2023-06-22 DIAGNOSIS — R2681 Unsteadiness on feet: Secondary | ICD-10-CM | POA: Diagnosis not present

## 2023-06-22 DIAGNOSIS — M1380 Other specified arthritis, unspecified site: Secondary | ICD-10-CM | POA: Diagnosis not present

## 2023-06-22 DIAGNOSIS — R2689 Other abnormalities of gait and mobility: Secondary | ICD-10-CM | POA: Diagnosis not present

## 2023-06-22 DIAGNOSIS — R296 Repeated falls: Secondary | ICD-10-CM | POA: Diagnosis not present

## 2023-06-25 DIAGNOSIS — R41841 Cognitive communication deficit: Secondary | ICD-10-CM | POA: Diagnosis not present

## 2023-06-25 DIAGNOSIS — R2681 Unsteadiness on feet: Secondary | ICD-10-CM | POA: Diagnosis not present

## 2023-06-25 DIAGNOSIS — M1380 Other specified arthritis, unspecified site: Secondary | ICD-10-CM | POA: Diagnosis not present

## 2023-06-25 DIAGNOSIS — R488 Other symbolic dysfunctions: Secondary | ICD-10-CM | POA: Diagnosis not present

## 2023-06-25 DIAGNOSIS — R2689 Other abnormalities of gait and mobility: Secondary | ICD-10-CM | POA: Diagnosis not present

## 2023-06-25 DIAGNOSIS — M6259 Muscle wasting and atrophy, not elsewhere classified, multiple sites: Secondary | ICD-10-CM | POA: Diagnosis not present

## 2023-06-27 DIAGNOSIS — R488 Other symbolic dysfunctions: Secondary | ICD-10-CM | POA: Diagnosis not present

## 2023-06-27 DIAGNOSIS — M6259 Muscle wasting and atrophy, not elsewhere classified, multiple sites: Secondary | ICD-10-CM | POA: Diagnosis not present

## 2023-06-27 DIAGNOSIS — R2681 Unsteadiness on feet: Secondary | ICD-10-CM | POA: Diagnosis not present

## 2023-06-27 DIAGNOSIS — R2689 Other abnormalities of gait and mobility: Secondary | ICD-10-CM | POA: Diagnosis not present

## 2023-06-27 DIAGNOSIS — R41841 Cognitive communication deficit: Secondary | ICD-10-CM | POA: Diagnosis not present

## 2023-06-27 DIAGNOSIS — M1380 Other specified arthritis, unspecified site: Secondary | ICD-10-CM | POA: Diagnosis not present

## 2023-06-29 DIAGNOSIS — M6259 Muscle wasting and atrophy, not elsewhere classified, multiple sites: Secondary | ICD-10-CM | POA: Diagnosis not present

## 2023-06-29 DIAGNOSIS — M1380 Other specified arthritis, unspecified site: Secondary | ICD-10-CM | POA: Diagnosis not present

## 2023-06-29 DIAGNOSIS — R2689 Other abnormalities of gait and mobility: Secondary | ICD-10-CM | POA: Diagnosis not present

## 2023-06-29 DIAGNOSIS — R41841 Cognitive communication deficit: Secondary | ICD-10-CM | POA: Diagnosis not present

## 2023-06-29 DIAGNOSIS — R488 Other symbolic dysfunctions: Secondary | ICD-10-CM | POA: Diagnosis not present

## 2023-06-29 DIAGNOSIS — R2681 Unsteadiness on feet: Secondary | ICD-10-CM | POA: Diagnosis not present

## 2023-07-02 DIAGNOSIS — M6259 Muscle wasting and atrophy, not elsewhere classified, multiple sites: Secondary | ICD-10-CM | POA: Diagnosis not present

## 2023-07-02 DIAGNOSIS — R41841 Cognitive communication deficit: Secondary | ICD-10-CM | POA: Diagnosis not present

## 2023-07-02 DIAGNOSIS — R488 Other symbolic dysfunctions: Secondary | ICD-10-CM | POA: Diagnosis not present

## 2023-07-02 DIAGNOSIS — R2681 Unsteadiness on feet: Secondary | ICD-10-CM | POA: Diagnosis not present

## 2023-07-02 DIAGNOSIS — R2689 Other abnormalities of gait and mobility: Secondary | ICD-10-CM | POA: Diagnosis not present

## 2023-07-02 DIAGNOSIS — M1380 Other specified arthritis, unspecified site: Secondary | ICD-10-CM | POA: Diagnosis not present

## 2023-07-03 DIAGNOSIS — M1380 Other specified arthritis, unspecified site: Secondary | ICD-10-CM | POA: Diagnosis not present

## 2023-07-03 DIAGNOSIS — M6259 Muscle wasting and atrophy, not elsewhere classified, multiple sites: Secondary | ICD-10-CM | POA: Diagnosis not present

## 2023-07-03 DIAGNOSIS — R41841 Cognitive communication deficit: Secondary | ICD-10-CM | POA: Diagnosis not present

## 2023-07-03 DIAGNOSIS — R488 Other symbolic dysfunctions: Secondary | ICD-10-CM | POA: Diagnosis not present

## 2023-07-03 DIAGNOSIS — R2681 Unsteadiness on feet: Secondary | ICD-10-CM | POA: Diagnosis not present

## 2023-07-03 DIAGNOSIS — R2689 Other abnormalities of gait and mobility: Secondary | ICD-10-CM | POA: Diagnosis not present

## 2023-07-04 DIAGNOSIS — R41841 Cognitive communication deficit: Secondary | ICD-10-CM | POA: Diagnosis not present

## 2023-07-04 DIAGNOSIS — R2689 Other abnormalities of gait and mobility: Secondary | ICD-10-CM | POA: Diagnosis not present

## 2023-07-04 DIAGNOSIS — R2681 Unsteadiness on feet: Secondary | ICD-10-CM | POA: Diagnosis not present

## 2023-07-04 DIAGNOSIS — M6259 Muscle wasting and atrophy, not elsewhere classified, multiple sites: Secondary | ICD-10-CM | POA: Diagnosis not present

## 2023-07-04 DIAGNOSIS — M1380 Other specified arthritis, unspecified site: Secondary | ICD-10-CM | POA: Diagnosis not present

## 2023-07-04 DIAGNOSIS — R488 Other symbolic dysfunctions: Secondary | ICD-10-CM | POA: Diagnosis not present

## 2023-07-05 ENCOUNTER — Ambulatory Visit: Payer: Medicare Other | Admitting: Family Medicine

## 2023-07-05 DIAGNOSIS — R2689 Other abnormalities of gait and mobility: Secondary | ICD-10-CM | POA: Diagnosis not present

## 2023-07-05 DIAGNOSIS — R488 Other symbolic dysfunctions: Secondary | ICD-10-CM | POA: Diagnosis not present

## 2023-07-05 DIAGNOSIS — M6259 Muscle wasting and atrophy, not elsewhere classified, multiple sites: Secondary | ICD-10-CM | POA: Diagnosis not present

## 2023-07-05 DIAGNOSIS — M1380 Other specified arthritis, unspecified site: Secondary | ICD-10-CM | POA: Diagnosis not present

## 2023-07-05 DIAGNOSIS — R41841 Cognitive communication deficit: Secondary | ICD-10-CM | POA: Diagnosis not present

## 2023-07-05 DIAGNOSIS — R2681 Unsteadiness on feet: Secondary | ICD-10-CM | POA: Diagnosis not present

## 2023-07-06 DIAGNOSIS — M1380 Other specified arthritis, unspecified site: Secondary | ICD-10-CM | POA: Diagnosis not present

## 2023-07-06 DIAGNOSIS — R41841 Cognitive communication deficit: Secondary | ICD-10-CM | POA: Diagnosis not present

## 2023-07-06 DIAGNOSIS — M6259 Muscle wasting and atrophy, not elsewhere classified, multiple sites: Secondary | ICD-10-CM | POA: Diagnosis not present

## 2023-07-06 DIAGNOSIS — R488 Other symbolic dysfunctions: Secondary | ICD-10-CM | POA: Diagnosis not present

## 2023-07-06 DIAGNOSIS — R2689 Other abnormalities of gait and mobility: Secondary | ICD-10-CM | POA: Diagnosis not present

## 2023-07-06 DIAGNOSIS — R2681 Unsteadiness on feet: Secondary | ICD-10-CM | POA: Diagnosis not present

## 2023-07-09 DIAGNOSIS — R2681 Unsteadiness on feet: Secondary | ICD-10-CM | POA: Diagnosis not present

## 2023-07-09 DIAGNOSIS — R41841 Cognitive communication deficit: Secondary | ICD-10-CM | POA: Diagnosis not present

## 2023-07-09 DIAGNOSIS — M1380 Other specified arthritis, unspecified site: Secondary | ICD-10-CM | POA: Diagnosis not present

## 2023-07-09 DIAGNOSIS — R488 Other symbolic dysfunctions: Secondary | ICD-10-CM | POA: Diagnosis not present

## 2023-07-09 DIAGNOSIS — R2689 Other abnormalities of gait and mobility: Secondary | ICD-10-CM | POA: Diagnosis not present

## 2023-07-09 DIAGNOSIS — M6259 Muscle wasting and atrophy, not elsewhere classified, multiple sites: Secondary | ICD-10-CM | POA: Diagnosis not present

## 2023-07-10 DIAGNOSIS — R2689 Other abnormalities of gait and mobility: Secondary | ICD-10-CM | POA: Diagnosis not present

## 2023-07-10 DIAGNOSIS — R41841 Cognitive communication deficit: Secondary | ICD-10-CM | POA: Diagnosis not present

## 2023-07-10 DIAGNOSIS — M6259 Muscle wasting and atrophy, not elsewhere classified, multiple sites: Secondary | ICD-10-CM | POA: Diagnosis not present

## 2023-07-10 DIAGNOSIS — R488 Other symbolic dysfunctions: Secondary | ICD-10-CM | POA: Diagnosis not present

## 2023-07-10 DIAGNOSIS — R2681 Unsteadiness on feet: Secondary | ICD-10-CM | POA: Diagnosis not present

## 2023-07-10 DIAGNOSIS — M1380 Other specified arthritis, unspecified site: Secondary | ICD-10-CM | POA: Diagnosis not present

## 2023-07-11 ENCOUNTER — Encounter: Payer: Self-pay | Admitting: Physician Assistant

## 2023-07-11 ENCOUNTER — Ambulatory Visit (INDEPENDENT_AMBULATORY_CARE_PROVIDER_SITE_OTHER): Payer: Medicare Other | Admitting: Physician Assistant

## 2023-07-11 ENCOUNTER — Ambulatory Visit: Payer: Medicare Other

## 2023-07-11 VITALS — BP 122/74 | HR 95 | Resp 18 | Ht 70.0 in | Wt 168.0 lb

## 2023-07-11 DIAGNOSIS — R296 Repeated falls: Secondary | ICD-10-CM | POA: Diagnosis not present

## 2023-07-11 DIAGNOSIS — R413 Other amnesia: Secondary | ICD-10-CM

## 2023-07-11 DIAGNOSIS — R41841 Cognitive communication deficit: Secondary | ICD-10-CM | POA: Diagnosis not present

## 2023-07-11 DIAGNOSIS — M1380 Other specified arthritis, unspecified site: Secondary | ICD-10-CM | POA: Diagnosis not present

## 2023-07-11 DIAGNOSIS — R2689 Other abnormalities of gait and mobility: Secondary | ICD-10-CM | POA: Diagnosis not present

## 2023-07-11 DIAGNOSIS — R2681 Unsteadiness on feet: Secondary | ICD-10-CM | POA: Diagnosis not present

## 2023-07-11 DIAGNOSIS — R488 Other symbolic dysfunctions: Secondary | ICD-10-CM | POA: Diagnosis not present

## 2023-07-11 DIAGNOSIS — M6259 Muscle wasting and atrophy, not elsewhere classified, multiple sites: Secondary | ICD-10-CM | POA: Diagnosis not present

## 2023-07-11 NOTE — Progress Notes (Signed)
Assessment/Plan:    The patient is seen in neurologic consultation at the request of Allen Majestic, MD for the evaluation of memory.  Allen Robinson is a very pleasant 89 y.o. year old RH male with a history of hypertension, hyperlipidemia, gout, BPH, GERD, insulin resistance with peripheral neuropathy, vitamin D deficiency, seen today for evaluation of memory loss. Unable to perform MoCA . MMSE today is  22/30. Patient is able to participate on some IADLs. No longer drives. Given his advanced age, no antidementia medication is advised, as the risks would outweigh the benefits of the medicine.    Memory Impairment  MRI brain without contrast to assess for underlying structural abnormality and assess vascular load  Recommend good control of cardiovascular risk factors.   Continue to control mood as per PCP Folllow up in 3 weeks to discuss the results of the MRI brain.     Chronic balance issues and frequent falls   He has a history of prediabetes peripheral neuropathy, and  chronic proprioception issues   Patient had PT at the [facility]. He had a DaTscan at the [facility] to evaluate for Parkinson's in August 2019, with negative results  Recommendations Continue PT at the [facility]  Continue the use of the walker to prevent falls MRI brain to determine any structural or vascular abnormalities that could contribute to his falls      Subjective:    The patient is accompanied by his wife  who supplements the history.    How long did patient have memory difficulties?Allen Robinson  Over the last 6 months.  Had COVID in July 2024, but his memory issues were present prior to it.  Patient has difficulty remembering recent conversations and people names. "He had been so good with names and faces before"-wife says  repeats oneself? "Not too much". Likes to read, [employer],  watch TV Disoriented when walking into a room?  Denies  Leaving objects in unusual places?   Might misplace but not in unusual locations     Wandering behavior? denies   Any personality changes ?  He is more anxious, worried than before.  Any history of depression?: Denies.   Hallucinations or paranoia?  Denies   Seizures? denies    Any sleep changes?  Gets up to urinate, but denies any issues Denies vivid dreams, REM behavior or sleepwalking   Sleep apnea? denies   Any hygiene concerns?  denies   Independent of bathing and dressing?  Endorsed  Does the patient need help with medications?  Patient in charge   Who is in charge of finances? Patient is in charge, but he may forget to pay some bills and his wife has noted more issues with finances  Any changes in appetite?  He has occasional hand tremor at times which may interfere with using utensils    Patient have trouble swallowing?  denies   Does the patient cook?  No   Any headaches?  denies   Chronic back pain?  denies   Ambulates with difficulty?  "Sometimes the left hill will drag, not always ". needs a walker to ambulate     Recent falls or head injuries? Has been falling more frequently for 1 year. He did hit his head and one time he broke his nose.  Vision changes? Cataract removal.  Stroke like symptoms?  denies   Any tremors? Occasional, for the last year "or even longer but not today"-wife says  Any anosmia?  denies   Any  incontinence of urine? I get up a couple of times at night, does not need a pad Any bowel dysfunction? Intermittent constipation  Patient lives  [place]    History of heavy alcohol intake? denies   History of heavy tobacco use? denies   Family history of dementia?   Denies    Does patient drive?  No longer drives, her reports that he was losing sense of direction   No Known Allergies  Current Outpatient Medications  Medication Instructions   allopurinol (ZYLOPRIM) 100 MG tablet TAKE TWO TABLETS BY MOUTH DAILY TO PREVENT GOUT, NOT FOR ACUTE ATTACKS, MUST TAKE EVERY DAY   colchicine 0.6 mg, Oral, As needed   finasteride (PROSCAR) 5 MG  tablet 1 tablet, Oral, Daily   Multiple Vitamin (MULTIVITAMIN ADULT PO) Oral   omeprazole (PRILOSEC) 20 mg, Oral, Daily PRN   simvastatin (ZOCOR) 20 mg, Oral, Daily at bedtime   terazosin (HYTRIN) 2 mg, Daily at bedtime     VITALS:   Vitals:   07/11/23 0945  BP: 122/74  Pulse: 95  Resp: 18  SpO2: 97%  Weight: 168 lb (76.2 kg)  Height: 5\' 10"  (1.778 m)      PHYSICAL EXAM   HEENT:  Normocephalic, atraumatic. The mucous membranes are moist. The superficial temporal arteries are without ropiness or tenderness. Cardiovascular: Regular rate and rhythm. Lungs: Clear to auscultation bilaterally. Neck: There are no carotid bruits noted bilaterally.  NEUROLOGICAL:     No data to display             07/11/2023    7:00 PM 04/18/2018    8:51 AM 03/14/2017    2:46 PM  MMSE - Mini Mental State Exam  Not completed:  -- --  Orientation to time 3    Orientation to Place 3    Registration 3    Attention/ Calculation 4    Recall 0    Language- name 2 objects 2    Language- repeat 1    Language- follow 3 step command 3    Language- read & follow direction 1    Write a sentence 1    Copy design 1    Total score 22       Orientation:  Alert and oriented to person, place and not to time. No aphasia or dysarthria. Fund of knowledge is appropriate. Recent and remote memory impaired.  Attention and concentration are reduced.  Able to name objects and unable to repeat phrases. Delayed recall 0/3l    Cranial nerves: There is good facial symmetry. Extraocular muscles are intact and visual fields are full to confrontational testing. Speech is fluent and clear, but slow, no tongue deviation. Hearing is decreased to conversational tone.  Tone: Tone is good throughout. Sensation: Sensation is intact to light touch and pinprick throughout. Vibration is intact B. Coordination: The patient has no difficulty with RAM's or FNF bilaterally. Normal finger to nose  Motor: Strength is 5/5 in the  bilateral upper and lower extremities. There is no pronator drift. There are no fasciculations noted. DTR's: Deep tendon reflexes are 2/4 .  Gait and Station: The patient is able to ambulate with difficulty. Needs a walker to ambulate. Gait is cautious and narrow. Stride is short.      Thank you for allowing [place] the opportunity to participate in the care of this nice patient. Please do not hesitate to contact us for any questions or concerns.   Total time spent on today's visit was 60  minutes dedicated to this patient today, preparing to see patient, examining the patient, ordering tests and/or medications and counseling the patient, documenting clinical information in the EHR or other health record, independently interpreting results and communicating results to the patient/family, discussing treatment and goals, answering patient's questions and coordinating care.  Cc:  Allen Majestic, MD  Marlowe Kays 07/11/2023 7:48 PM

## 2023-07-11 NOTE — Patient Instructions (Signed)
It was a pleasure to see you today at our office.   Recommendations:    MRI of the brain, the radiology office will call you to arrange you appointment   Check labs today   Follow up in 3  weeks  Continue Physical Therapy and Speech therapy    For psychiatric meds, mood meds: Please have your primary care physician manage these medications.  If you have any severe symptoms of a stroke, or other severe issues such as confusion,severe chills or fever, etc call 911 or go to the ER as you may need to be evaluated further    For assessment of decision of mental capacity and competency:  Call Dr. Erick Blinks, geriatric psychiatrist at [phone]  Counseling regarding caregiver distress, including caregiver depression, anxiety and issues regarding community resources, adult day care programs, adult living facilities, or memory care questions:  please contact your  Primary Doctor's Social Worker   Whom to call: Memory  decline, memory medications: Call our office [phone]    [email or web address]   RECOMMENDATIONS FOR ALL PATIENTS WITH MEMORY PROBLEMS: 1. Continue to exercise (Recommend 30 minutes of walking everyday, or 3 hours every week) 2. Increase social interactions - continue going to [place] and enjoy social gatherings with friends and family 3. Eat healthy, avoid fried foods and eat more fruits and vegetables 4. Maintain adequate blood pressure, blood sugar, and blood cholesterol level. Reducing the risk of stroke and cardiovascular disease also helps promoting better memory. 5. Avoid stressful situations. Live a simple life and avoid aggravations. Organize your time and prepare for the next day in anticipation. 6. Sleep well, avoid any interruptions of sleep and avoid any distractions in the bedroom that may interfere with adequate sleep quality 7. Avoid sugar, avoid sweets as there is a strong link between excessive sugar  intake, diabetes, and cognitive impairment We discussed the Mediterranean diet, which has been shown to help patients reduce the risk of progressive memory disorders and reduces cardiovascular risk. This includes eating fish, eat fruits and green leafy vegetables, nuts like almonds and hazelnuts, walnuts, and also use olive oil. Avoid fast foods and fried foods as much as possible. Avoid sweets and sugar as sugar use has been linked to worsening of memory function.  There is always a concern of gradual progression of memory problems. If this is the case, then we may need to adjust level of care according to patient needs. Support, both to the patient and caregiver, should then be put into place.          FALL PRECAUTIONS: Be cautious when walking. Scan the area for obstacles that may increase the risk of trips and falls. When getting up in the mornings, sit up at the edge of the bed for a few minutes before getting out of bed. Consider elevating the bed at the head end to avoid drop of blood pressure when getting up. Walk always in a well-lit room (use night lights in the walls). Avoid area rugs or power cords from appliances in the middle of the walkways. Use a walker or a cane if necessary and consider physical therapy for balance exercise. Get your eyesight checked regularly.  FINANCIAL OVERSIGHT: Supervision, especially oversight when making financial decisions or transactions is also recommended.  HOME SAFETY: Consider the safety of the kitchen when operating appliances like stoves, microwave oven, and blender. Consider having supervision and share cooking responsibilities until no longer able to participate in those. Accidents with firearms and other hazards  in the house should be identified and addressed as well.   ABILITY TO BE LEFT ALONE: If patient is unable to contact 911 operator, consider using LifeLine, or when the need is there, arrange for someone to stay with patients. Smoking is a  fire hazard, consider supervision or cessation. Risk of wandering should be assessed by caregiver and if detected at any point, supervision and safe proof recommendations should be instituted.  MEDICATION SUPERVISION: Inability to self-administer medication needs to be constantly addressed. Implement a mechanism to ensure safe administration of the medications.      Mediterranean Diet A Mediterranean diet refers to food and lifestyle choices that are based on the traditions of countries located on the [employer] [place]. This way of eating has been shown to help prevent certain conditions and improve outcomes for people who have chronic diseases, like kidney disease and heart disease. What are tips for following this plan? Lifestyle  Cook and eat meals together with your family, when possible. Drink enough fluid to keep your urine clear or pale yellow. Be physically active every day. This includes: Aerobic exercise like running or swimming. Leisure activities like gardening, walking, or housework. Get 7-8 hours of sleep each night. If recommended by your health care provider, drink red wine in moderation. This means 1 glass a day for nonpregnant women and 2 glasses a day for men. A glass of wine equals 5 oz (150 mL). Reading food labels  Check the serving size of packaged foods. For foods such as rice and pasta, the serving size refers to the amount of cooked product, not dry. Check the total fat in packaged foods. Avoid foods that have saturated fat or trans fats. Check the ingredients list for added sugars, such as corn syrup. Shopping  At the grocery store, buy most of your food from the areas near the walls of the store. This includes: Fresh fruits and vegetables (produce). Grains, beans, nuts, and seeds. Some of these may be available in unpackaged forms or large amounts (in bulk). Fresh seafood. Poultry and eggs. Low-fat dairy products. Buy whole ingredients instead of prepackaged  foods. Buy fresh fruits and vegetables in-season from local farmers markets. Buy frozen fruits and vegetables in resealable bags. If you do not have access to quality fresh seafood, buy precooked frozen shrimp or canned fish, such as tuna, salmon, or sardines. Buy small amounts of raw or cooked vegetables, salads, or olives from the deli or salad bar at your store. Stock your pantry so you always have certain foods on hand, such as olive oil, canned tuna, canned tomatoes, rice, pasta, and beans. Cooking  Cook foods with extra-virgin olive oil instead of using butter or other vegetable oils. Have meat as a side dish, and have vegetables or grains as your main dish. This means having meat in small portions or adding small amounts of meat to foods like pasta or stew. Use beans or vegetables instead of meat in common dishes like chili or lasagna. Experiment with different cooking methods. Try roasting or broiling vegetables instead of steaming or sauteing them. Add frozen vegetables to soups, stews, pasta, or rice. Add nuts or seeds for added healthy fat at each meal. You can add these to yogurt, salads, or vegetable dishes. Marinate fish or vegetables using olive oil, lemon juice, garlic, and fresh herbs. Meal planning  Plan to eat 1 vegetarian meal one day each week. Try to work up to 2 vegetarian meals, if possible. Eat seafood 2 or more times  a week. Have healthy snacks readily available, such as: Vegetable sticks with hummus. Greek yogurt. Fruit and nut trail mix. Eat balanced meals throughout the week. This includes: Fruit: 2-3 servings a day Vegetables: 4-5 servings a day Low-fat dairy: 2 servings a day Fish, poultry, or lean meat: 1 serving a day Beans and legumes: 2 or more servings a week Nuts and seeds: 1-2 servings a day Whole grains: 6-8 servings a day Extra-virgin olive oil: 3-4 servings a day Limit red meat and sweets to only a few servings a month What are my food  choices? Mediterranean diet Recommended Grains: Whole-grain pasta. Brown rice. Bulgar wheat. Polenta. Couscous. Whole-wheat bread. Orpah Cobb. Vegetables: Artichokes. Beets. Broccoli. Cabbage. Carrots. Eggplant. Green beans. Chard. Kale. Spinach. Onions. Leeks. Peas. Squash. Tomatoes. Peppers. Radishes. Fruits: Apples. Apricots. Avocado. Berries. Bananas. Cherries. Dates. Figs. Grapes. Lemons. Melon. Oranges. Peaches. Plums. Pomegranate. Meats and other protein foods: Beans. Almonds. Sunflower seeds. Pine nuts. Peanuts. Cod. Salmon. Scallops. Shrimp. Tuna. Tilapia. Clams. Oysters. Eggs. Dairy: Low-fat milk. Cheese. Greek yogurt. Beverages: Water. Red wine. Herbal tea. Fats and oils: Extra virgin olive oil. Avocado oil. Grape seed oil. Sweets and desserts: [place] yogurt with honey. Baked apples. Poached pears. Trail mix. Seasoning and other foods: Basil. Cilantro. Coriander. Cumin. Mint. Parsley. Sage. Rosemary. Tarragon. Garlic. Oregano. Thyme. Pepper. Balsalmic vinegar. Tahini. Hummus. Tomato sauce. Olives. Mushrooms. Limit these Grains: Prepackaged pasta or rice dishes. Prepackaged cereal with added sugar. Vegetables: Deep fried potatoes (french fries). Fruits: Fruit canned in syrup. Meats and other protein foods: Beef. Pork. Lamb. Poultry with skin. Hot dogs. Tomasa Blase. Dairy: Ice cream. Sour cream. Whole milk. Beverages: Juice. Sugar-sweetened soft drinks. Beer. Liquor and spirits. Fats and oils: Butter. Canola oil. Vegetable oil. Beef fat (tallow). Lard. Sweets and desserts: Cookies. Cakes. Pies. Candy. Seasoning and other foods: Mayonnaise. Premade sauces and marinades. The items listed may not be a complete list. Talk with your dietitian about what dietary choices are right for you. Summary The Mediterranean diet includes both food and lifestyle choices. Eat a variety of fresh fruits and vegetables, beans, nuts, seeds, and whole grains. Limit the amount of red meat and sweets that  you eat. Talk with your health care provider about whether it is safe for you to drink red wine in moderation. This means 1 glass a day for nonpregnant women and 2 glasses a day for men. A glass of wine equals 5 oz (150 mL). This information is not intended to replace advice given to you by your health care provider. Make sure you discuss any questions you have with your health care provider. Document Released: 06/29/2016 Document Revised: 08/01/2016 Document Reviewed: 06/29/2016 Elsevier Interactive Patient Education  2017 [employer].

## 2023-07-12 DIAGNOSIS — M6259 Muscle wasting and atrophy, not elsewhere classified, multiple sites: Secondary | ICD-10-CM | POA: Diagnosis not present

## 2023-07-12 DIAGNOSIS — R41841 Cognitive communication deficit: Secondary | ICD-10-CM | POA: Diagnosis not present

## 2023-07-12 DIAGNOSIS — R2681 Unsteadiness on feet: Secondary | ICD-10-CM | POA: Diagnosis not present

## 2023-07-12 DIAGNOSIS — R488 Other symbolic dysfunctions: Secondary | ICD-10-CM | POA: Diagnosis not present

## 2023-07-12 DIAGNOSIS — M1380 Other specified arthritis, unspecified site: Secondary | ICD-10-CM | POA: Diagnosis not present

## 2023-07-12 DIAGNOSIS — R2689 Other abnormalities of gait and mobility: Secondary | ICD-10-CM | POA: Diagnosis not present

## 2023-07-16 DIAGNOSIS — M1380 Other specified arthritis, unspecified site: Secondary | ICD-10-CM | POA: Diagnosis not present

## 2023-07-16 DIAGNOSIS — R41841 Cognitive communication deficit: Secondary | ICD-10-CM | POA: Diagnosis not present

## 2023-07-16 DIAGNOSIS — M6259 Muscle wasting and atrophy, not elsewhere classified, multiple sites: Secondary | ICD-10-CM | POA: Diagnosis not present

## 2023-07-16 DIAGNOSIS — R2681 Unsteadiness on feet: Secondary | ICD-10-CM | POA: Diagnosis not present

## 2023-07-16 DIAGNOSIS — R2689 Other abnormalities of gait and mobility: Secondary | ICD-10-CM | POA: Diagnosis not present

## 2023-07-16 DIAGNOSIS — R488 Other symbolic dysfunctions: Secondary | ICD-10-CM | POA: Diagnosis not present

## 2023-07-17 DIAGNOSIS — M6259 Muscle wasting and atrophy, not elsewhere classified, multiple sites: Secondary | ICD-10-CM | POA: Diagnosis not present

## 2023-07-17 DIAGNOSIS — R2689 Other abnormalities of gait and mobility: Secondary | ICD-10-CM | POA: Diagnosis not present

## 2023-07-17 DIAGNOSIS — R2681 Unsteadiness on feet: Secondary | ICD-10-CM | POA: Diagnosis not present

## 2023-07-17 DIAGNOSIS — R488 Other symbolic dysfunctions: Secondary | ICD-10-CM | POA: Diagnosis not present

## 2023-07-17 DIAGNOSIS — R41841 Cognitive communication deficit: Secondary | ICD-10-CM | POA: Diagnosis not present

## 2023-07-17 DIAGNOSIS — M1380 Other specified arthritis, unspecified site: Secondary | ICD-10-CM | POA: Diagnosis not present

## 2023-07-19 DIAGNOSIS — R41841 Cognitive communication deficit: Secondary | ICD-10-CM | POA: Diagnosis not present

## 2023-07-19 DIAGNOSIS — R488 Other symbolic dysfunctions: Secondary | ICD-10-CM | POA: Diagnosis not present

## 2023-07-19 DIAGNOSIS — M1380 Other specified arthritis, unspecified site: Secondary | ICD-10-CM | POA: Diagnosis not present

## 2023-07-19 DIAGNOSIS — M6259 Muscle wasting and atrophy, not elsewhere classified, multiple sites: Secondary | ICD-10-CM | POA: Diagnosis not present

## 2023-07-19 DIAGNOSIS — R2689 Other abnormalities of gait and mobility: Secondary | ICD-10-CM | POA: Diagnosis not present

## 2023-07-19 DIAGNOSIS — R2681 Unsteadiness on feet: Secondary | ICD-10-CM | POA: Diagnosis not present

## 2023-07-20 ENCOUNTER — Ambulatory Visit
Admission: RE | Admit: 2023-07-20 | Discharge: 2023-07-20 | Disposition: A | Discharge status: Home / Self Care | Payer: Medicare Other | Source: Ambulatory Visit | Attending: Physician Assistant | Admitting: Physician Assistant

## 2023-07-20 DIAGNOSIS — R2681 Unsteadiness on feet: Secondary | ICD-10-CM | POA: Diagnosis not present

## 2023-07-20 DIAGNOSIS — R488 Other symbolic dysfunctions: Secondary | ICD-10-CM | POA: Diagnosis not present

## 2023-07-20 DIAGNOSIS — R41841 Cognitive communication deficit: Secondary | ICD-10-CM | POA: Diagnosis not present

## 2023-07-20 DIAGNOSIS — R413 Other amnesia: Secondary | ICD-10-CM | POA: Diagnosis not present

## 2023-07-20 DIAGNOSIS — R296 Repeated falls: Secondary | ICD-10-CM | POA: Diagnosis not present

## 2023-07-20 DIAGNOSIS — M1380 Other specified arthritis, unspecified site: Secondary | ICD-10-CM | POA: Diagnosis not present

## 2023-07-20 DIAGNOSIS — M6259 Muscle wasting and atrophy, not elsewhere classified, multiple sites: Secondary | ICD-10-CM | POA: Diagnosis not present

## 2023-07-20 DIAGNOSIS — R2689 Other abnormalities of gait and mobility: Secondary | ICD-10-CM | POA: Diagnosis not present

## 2023-07-24 DIAGNOSIS — R2689 Other abnormalities of gait and mobility: Secondary | ICD-10-CM | POA: Diagnosis not present

## 2023-07-24 DIAGNOSIS — M6259 Muscle wasting and atrophy, not elsewhere classified, multiple sites: Secondary | ICD-10-CM | POA: Diagnosis not present

## 2023-07-24 DIAGNOSIS — R296 Repeated falls: Secondary | ICD-10-CM | POA: Diagnosis not present

## 2023-07-24 DIAGNOSIS — R2681 Unsteadiness on feet: Secondary | ICD-10-CM | POA: Diagnosis not present

## 2023-07-24 DIAGNOSIS — R41841 Cognitive communication deficit: Secondary | ICD-10-CM | POA: Diagnosis not present

## 2023-07-24 DIAGNOSIS — R488 Other symbolic dysfunctions: Secondary | ICD-10-CM | POA: Diagnosis not present

## 2023-07-24 DIAGNOSIS — M1380 Other specified arthritis, unspecified site: Secondary | ICD-10-CM | POA: Diagnosis not present

## 2023-07-25 DIAGNOSIS — R488 Other symbolic dysfunctions: Secondary | ICD-10-CM | POA: Diagnosis not present

## 2023-07-25 DIAGNOSIS — R41841 Cognitive communication deficit: Secondary | ICD-10-CM | POA: Diagnosis not present

## 2023-07-25 DIAGNOSIS — M6259 Muscle wasting and atrophy, not elsewhere classified, multiple sites: Secondary | ICD-10-CM | POA: Diagnosis not present

## 2023-07-25 DIAGNOSIS — M1380 Other specified arthritis, unspecified site: Secondary | ICD-10-CM | POA: Diagnosis not present

## 2023-07-25 DIAGNOSIS — R2681 Unsteadiness on feet: Secondary | ICD-10-CM | POA: Diagnosis not present

## 2023-07-25 DIAGNOSIS — R2689 Other abnormalities of gait and mobility: Secondary | ICD-10-CM | POA: Diagnosis not present

## 2023-07-26 DIAGNOSIS — M6259 Muscle wasting and atrophy, not elsewhere classified, multiple sites: Secondary | ICD-10-CM | POA: Diagnosis not present

## 2023-07-26 DIAGNOSIS — R2689 Other abnormalities of gait and mobility: Secondary | ICD-10-CM | POA: Diagnosis not present

## 2023-07-26 DIAGNOSIS — M1380 Other specified arthritis, unspecified site: Secondary | ICD-10-CM | POA: Diagnosis not present

## 2023-07-26 DIAGNOSIS — R41841 Cognitive communication deficit: Secondary | ICD-10-CM | POA: Diagnosis not present

## 2023-07-26 DIAGNOSIS — R488 Other symbolic dysfunctions: Secondary | ICD-10-CM | POA: Diagnosis not present

## 2023-07-26 DIAGNOSIS — B351 Tinea unguium: Secondary | ICD-10-CM | POA: Diagnosis not present

## 2023-07-26 DIAGNOSIS — M2012 Hallux valgus (acquired), left foot: Secondary | ICD-10-CM | POA: Diagnosis not present

## 2023-07-26 DIAGNOSIS — R2681 Unsteadiness on feet: Secondary | ICD-10-CM | POA: Diagnosis not present

## 2023-07-27 DIAGNOSIS — R2681 Unsteadiness on feet: Secondary | ICD-10-CM | POA: Diagnosis not present

## 2023-07-27 DIAGNOSIS — R2689 Other abnormalities of gait and mobility: Secondary | ICD-10-CM | POA: Diagnosis not present

## 2023-07-27 DIAGNOSIS — M1380 Other specified arthritis, unspecified site: Secondary | ICD-10-CM | POA: Diagnosis not present

## 2023-07-27 DIAGNOSIS — M6259 Muscle wasting and atrophy, not elsewhere classified, multiple sites: Secondary | ICD-10-CM | POA: Diagnosis not present

## 2023-07-27 DIAGNOSIS — R41841 Cognitive communication deficit: Secondary | ICD-10-CM | POA: Diagnosis not present

## 2023-07-27 DIAGNOSIS — R488 Other symbolic dysfunctions: Secondary | ICD-10-CM | POA: Diagnosis not present

## 2023-07-30 DIAGNOSIS — H52203 Unspecified astigmatism, bilateral: Secondary | ICD-10-CM | POA: Diagnosis not present

## 2023-07-30 DIAGNOSIS — Z961 Presence of intraocular lens: Secondary | ICD-10-CM | POA: Diagnosis not present

## 2023-07-30 DIAGNOSIS — M6259 Muscle wasting and atrophy, not elsewhere classified, multiple sites: Secondary | ICD-10-CM | POA: Diagnosis not present

## 2023-07-30 DIAGNOSIS — M1380 Other specified arthritis, unspecified site: Secondary | ICD-10-CM | POA: Diagnosis not present

## 2023-07-30 DIAGNOSIS — R2681 Unsteadiness on feet: Secondary | ICD-10-CM | POA: Diagnosis not present

## 2023-07-30 DIAGNOSIS — R488 Other symbolic dysfunctions: Secondary | ICD-10-CM | POA: Diagnosis not present

## 2023-07-30 DIAGNOSIS — R41841 Cognitive communication deficit: Secondary | ICD-10-CM | POA: Diagnosis not present

## 2023-07-30 DIAGNOSIS — R2689 Other abnormalities of gait and mobility: Secondary | ICD-10-CM | POA: Diagnosis not present

## 2023-08-01 DIAGNOSIS — M1380 Other specified arthritis, unspecified site: Secondary | ICD-10-CM | POA: Diagnosis not present

## 2023-08-01 DIAGNOSIS — R2689 Other abnormalities of gait and mobility: Secondary | ICD-10-CM | POA: Diagnosis not present

## 2023-08-01 DIAGNOSIS — R41841 Cognitive communication deficit: Secondary | ICD-10-CM | POA: Diagnosis not present

## 2023-08-01 DIAGNOSIS — R488 Other symbolic dysfunctions: Secondary | ICD-10-CM | POA: Diagnosis not present

## 2023-08-01 DIAGNOSIS — R2681 Unsteadiness on feet: Secondary | ICD-10-CM | POA: Diagnosis not present

## 2023-08-01 DIAGNOSIS — M6259 Muscle wasting and atrophy, not elsewhere classified, multiple sites: Secondary | ICD-10-CM | POA: Diagnosis not present

## 2023-08-02 DIAGNOSIS — L812 Freckles: Secondary | ICD-10-CM | POA: Diagnosis not present

## 2023-08-02 DIAGNOSIS — R41841 Cognitive communication deficit: Secondary | ICD-10-CM | POA: Diagnosis not present

## 2023-08-02 DIAGNOSIS — L821 Other seborrheic keratosis: Secondary | ICD-10-CM | POA: Diagnosis not present

## 2023-08-02 DIAGNOSIS — R2681 Unsteadiness on feet: Secondary | ICD-10-CM | POA: Diagnosis not present

## 2023-08-02 DIAGNOSIS — R488 Other symbolic dysfunctions: Secondary | ICD-10-CM | POA: Diagnosis not present

## 2023-08-02 DIAGNOSIS — Z85828 Personal history of other malignant neoplasm of skin: Secondary | ICD-10-CM | POA: Diagnosis not present

## 2023-08-02 DIAGNOSIS — R2689 Other abnormalities of gait and mobility: Secondary | ICD-10-CM | POA: Diagnosis not present

## 2023-08-02 DIAGNOSIS — M1380 Other specified arthritis, unspecified site: Secondary | ICD-10-CM | POA: Diagnosis not present

## 2023-08-02 DIAGNOSIS — M6259 Muscle wasting and atrophy, not elsewhere classified, multiple sites: Secondary | ICD-10-CM | POA: Diagnosis not present

## 2023-08-02 DIAGNOSIS — D1801 Hemangioma of skin and subcutaneous tissue: Secondary | ICD-10-CM | POA: Diagnosis not present

## 2023-08-03 DIAGNOSIS — R2689 Other abnormalities of gait and mobility: Secondary | ICD-10-CM | POA: Diagnosis not present

## 2023-08-03 DIAGNOSIS — M1380 Other specified arthritis, unspecified site: Secondary | ICD-10-CM | POA: Diagnosis not present

## 2023-08-03 DIAGNOSIS — R488 Other symbolic dysfunctions: Secondary | ICD-10-CM | POA: Diagnosis not present

## 2023-08-03 DIAGNOSIS — R2681 Unsteadiness on feet: Secondary | ICD-10-CM | POA: Diagnosis not present

## 2023-08-03 DIAGNOSIS — R41841 Cognitive communication deficit: Secondary | ICD-10-CM | POA: Diagnosis not present

## 2023-08-03 DIAGNOSIS — M6259 Muscle wasting and atrophy, not elsewhere classified, multiple sites: Secondary | ICD-10-CM | POA: Diagnosis not present

## 2023-08-06 DIAGNOSIS — R41841 Cognitive communication deficit: Secondary | ICD-10-CM | POA: Diagnosis not present

## 2023-08-06 DIAGNOSIS — R2681 Unsteadiness on feet: Secondary | ICD-10-CM | POA: Diagnosis not present

## 2023-08-06 DIAGNOSIS — M1380 Other specified arthritis, unspecified site: Secondary | ICD-10-CM | POA: Diagnosis not present

## 2023-08-06 DIAGNOSIS — R2689 Other abnormalities of gait and mobility: Secondary | ICD-10-CM | POA: Diagnosis not present

## 2023-08-06 DIAGNOSIS — R488 Other symbolic dysfunctions: Secondary | ICD-10-CM | POA: Diagnosis not present

## 2023-08-06 DIAGNOSIS — M6259 Muscle wasting and atrophy, not elsewhere classified, multiple sites: Secondary | ICD-10-CM | POA: Diagnosis not present

## 2023-08-07 DIAGNOSIS — M6259 Muscle wasting and atrophy, not elsewhere classified, multiple sites: Secondary | ICD-10-CM | POA: Diagnosis not present

## 2023-08-07 DIAGNOSIS — R41841 Cognitive communication deficit: Secondary | ICD-10-CM | POA: Diagnosis not present

## 2023-08-07 DIAGNOSIS — M1380 Other specified arthritis, unspecified site: Secondary | ICD-10-CM | POA: Diagnosis not present

## 2023-08-07 DIAGNOSIS — R488 Other symbolic dysfunctions: Secondary | ICD-10-CM | POA: Diagnosis not present

## 2023-08-07 DIAGNOSIS — R2689 Other abnormalities of gait and mobility: Secondary | ICD-10-CM | POA: Diagnosis not present

## 2023-08-07 DIAGNOSIS — R2681 Unsteadiness on feet: Secondary | ICD-10-CM | POA: Diagnosis not present

## 2023-08-08 DIAGNOSIS — R488 Other symbolic dysfunctions: Secondary | ICD-10-CM | POA: Diagnosis not present

## 2023-08-08 DIAGNOSIS — M1380 Other specified arthritis, unspecified site: Secondary | ICD-10-CM | POA: Diagnosis not present

## 2023-08-08 DIAGNOSIS — R2689 Other abnormalities of gait and mobility: Secondary | ICD-10-CM | POA: Diagnosis not present

## 2023-08-08 DIAGNOSIS — R2681 Unsteadiness on feet: Secondary | ICD-10-CM | POA: Diagnosis not present

## 2023-08-08 DIAGNOSIS — M6259 Muscle wasting and atrophy, not elsewhere classified, multiple sites: Secondary | ICD-10-CM | POA: Diagnosis not present

## 2023-08-08 DIAGNOSIS — R41841 Cognitive communication deficit: Secondary | ICD-10-CM | POA: Diagnosis not present

## 2023-08-09 DIAGNOSIS — M6259 Muscle wasting and atrophy, not elsewhere classified, multiple sites: Secondary | ICD-10-CM | POA: Diagnosis not present

## 2023-08-09 DIAGNOSIS — M1380 Other specified arthritis, unspecified site: Secondary | ICD-10-CM | POA: Diagnosis not present

## 2023-08-09 DIAGNOSIS — R2689 Other abnormalities of gait and mobility: Secondary | ICD-10-CM | POA: Diagnosis not present

## 2023-08-09 DIAGNOSIS — R2681 Unsteadiness on feet: Secondary | ICD-10-CM | POA: Diagnosis not present

## 2023-08-09 DIAGNOSIS — R41841 Cognitive communication deficit: Secondary | ICD-10-CM | POA: Diagnosis not present

## 2023-08-09 DIAGNOSIS — R488 Other symbolic dysfunctions: Secondary | ICD-10-CM | POA: Diagnosis not present

## 2023-08-20 LAB — BASIC METABOLIC PANEL
BUN: 25 — AB (ref 4–21)
CO2: 28 — AB (ref 13–22)
Creatinine: 1.2 (ref 0.6–1.3)
Glucose: 98
Potassium: 4.4 meq/L (ref 3.5–5.1)
Sodium: 142 (ref 137–147)

## 2023-08-20 LAB — CBC AND DIFFERENTIAL
HCT: 41 (ref 41–53)
Hemoglobin: 14.2 (ref 13.5–17.5)
Platelets: 163 10*3/uL (ref 150–400)
WBC: 5.6

## 2023-08-20 LAB — CBC: RBC: 4.54 (ref 3.87–5.11)

## 2023-08-20 LAB — COMPREHENSIVE METABOLIC PANEL
Albumin: 4.5 (ref 3.5–5.0)
Calcium: 9.7 (ref 8.7–10.7)

## 2023-08-20 LAB — HEPATIC FUNCTION PANEL
ALT: 20 U/L (ref 10–40)
AST: 28 (ref 14–40)

## 2023-08-20 LAB — LIPID PANEL
Cholesterol: 160 (ref 0–200)
HDL: 39 (ref 35–70)
LDL Cholesterol: 99
Triglycerides: 85 (ref 40–160)

## 2023-08-20 LAB — HEMOGLOBIN A1C: Hemoglobin A1C: 6.1

## 2023-08-20 LAB — VITAMIN D 25 HYDROXY (VIT D DEFICIENCY, FRACTURES): Vit D, 25-Hydroxy: 43.8

## 2023-08-21 DIAGNOSIS — R2689 Other abnormalities of gait and mobility: Secondary | ICD-10-CM | POA: Diagnosis not present

## 2023-08-21 DIAGNOSIS — M1380 Other specified arthritis, unspecified site: Secondary | ICD-10-CM | POA: Diagnosis not present

## 2023-08-21 DIAGNOSIS — M6259 Muscle wasting and atrophy, not elsewhere classified, multiple sites: Secondary | ICD-10-CM | POA: Diagnosis not present

## 2023-08-21 DIAGNOSIS — R488 Other symbolic dysfunctions: Secondary | ICD-10-CM | POA: Diagnosis not present

## 2023-08-21 DIAGNOSIS — R2681 Unsteadiness on feet: Secondary | ICD-10-CM | POA: Diagnosis not present

## 2023-08-21 DIAGNOSIS — R41841 Cognitive communication deficit: Secondary | ICD-10-CM | POA: Diagnosis not present

## 2023-08-23 DIAGNOSIS — R2689 Other abnormalities of gait and mobility: Secondary | ICD-10-CM | POA: Diagnosis not present

## 2023-08-23 DIAGNOSIS — M6259 Muscle wasting and atrophy, not elsewhere classified, multiple sites: Secondary | ICD-10-CM | POA: Diagnosis not present

## 2023-08-23 DIAGNOSIS — R488 Other symbolic dysfunctions: Secondary | ICD-10-CM | POA: Diagnosis not present

## 2023-08-23 DIAGNOSIS — R2681 Unsteadiness on feet: Secondary | ICD-10-CM | POA: Diagnosis not present

## 2023-08-23 DIAGNOSIS — R41841 Cognitive communication deficit: Secondary | ICD-10-CM | POA: Diagnosis not present

## 2023-08-23 DIAGNOSIS — M1380 Other specified arthritis, unspecified site: Secondary | ICD-10-CM | POA: Diagnosis not present

## 2023-08-24 DIAGNOSIS — R2689 Other abnormalities of gait and mobility: Secondary | ICD-10-CM | POA: Diagnosis not present

## 2023-08-24 DIAGNOSIS — R488 Other symbolic dysfunctions: Secondary | ICD-10-CM | POA: Diagnosis not present

## 2023-08-24 DIAGNOSIS — R41841 Cognitive communication deficit: Secondary | ICD-10-CM | POA: Diagnosis not present

## 2023-08-24 DIAGNOSIS — R2681 Unsteadiness on feet: Secondary | ICD-10-CM | POA: Diagnosis not present

## 2023-08-24 DIAGNOSIS — M1380 Other specified arthritis, unspecified site: Secondary | ICD-10-CM | POA: Diagnosis not present

## 2023-08-24 DIAGNOSIS — M6259 Muscle wasting and atrophy, not elsewhere classified, multiple sites: Secondary | ICD-10-CM | POA: Diagnosis not present

## 2023-08-28 DIAGNOSIS — R2681 Unsteadiness on feet: Secondary | ICD-10-CM | POA: Diagnosis not present

## 2023-08-28 DIAGNOSIS — R2689 Other abnormalities of gait and mobility: Secondary | ICD-10-CM | POA: Diagnosis not present

## 2023-08-28 DIAGNOSIS — R41841 Cognitive communication deficit: Secondary | ICD-10-CM | POA: Diagnosis not present

## 2023-08-28 DIAGNOSIS — M1380 Other specified arthritis, unspecified site: Secondary | ICD-10-CM | POA: Diagnosis not present

## 2023-08-28 DIAGNOSIS — M6259 Muscle wasting and atrophy, not elsewhere classified, multiple sites: Secondary | ICD-10-CM | POA: Diagnosis not present

## 2023-08-28 DIAGNOSIS — R488 Other symbolic dysfunctions: Secondary | ICD-10-CM | POA: Diagnosis not present

## 2023-08-29 ENCOUNTER — Ambulatory Visit: Payer: Medicare Other | Admitting: Physician Assistant

## 2023-08-31 DIAGNOSIS — R2689 Other abnormalities of gait and mobility: Secondary | ICD-10-CM | POA: Diagnosis not present

## 2023-08-31 DIAGNOSIS — M6259 Muscle wasting and atrophy, not elsewhere classified, multiple sites: Secondary | ICD-10-CM | POA: Diagnosis not present

## 2023-08-31 DIAGNOSIS — M1380 Other specified arthritis, unspecified site: Secondary | ICD-10-CM | POA: Diagnosis not present

## 2023-08-31 DIAGNOSIS — R2681 Unsteadiness on feet: Secondary | ICD-10-CM | POA: Diagnosis not present

## 2023-08-31 DIAGNOSIS — R488 Other symbolic dysfunctions: Secondary | ICD-10-CM | POA: Diagnosis not present

## 2023-08-31 DIAGNOSIS — R41841 Cognitive communication deficit: Secondary | ICD-10-CM | POA: Diagnosis not present

## 2023-09-03 DIAGNOSIS — Z23 Encounter for immunization: Secondary | ICD-10-CM | POA: Diagnosis not present

## 2023-09-05 DIAGNOSIS — M6259 Muscle wasting and atrophy, not elsewhere classified, multiple sites: Secondary | ICD-10-CM | POA: Diagnosis not present

## 2023-09-05 DIAGNOSIS — R488 Other symbolic dysfunctions: Secondary | ICD-10-CM | POA: Diagnosis not present

## 2023-09-05 DIAGNOSIS — R41841 Cognitive communication deficit: Secondary | ICD-10-CM | POA: Diagnosis not present

## 2023-09-05 DIAGNOSIS — R2681 Unsteadiness on feet: Secondary | ICD-10-CM | POA: Diagnosis not present

## 2023-09-05 DIAGNOSIS — R2689 Other abnormalities of gait and mobility: Secondary | ICD-10-CM | POA: Diagnosis not present

## 2023-09-05 DIAGNOSIS — M1380 Other specified arthritis, unspecified site: Secondary | ICD-10-CM | POA: Diagnosis not present

## 2023-09-06 DIAGNOSIS — R488 Other symbolic dysfunctions: Secondary | ICD-10-CM | POA: Diagnosis not present

## 2023-09-06 DIAGNOSIS — M6259 Muscle wasting and atrophy, not elsewhere classified, multiple sites: Secondary | ICD-10-CM | POA: Diagnosis not present

## 2023-09-06 DIAGNOSIS — R41841 Cognitive communication deficit: Secondary | ICD-10-CM | POA: Diagnosis not present

## 2023-09-06 DIAGNOSIS — R2689 Other abnormalities of gait and mobility: Secondary | ICD-10-CM | POA: Diagnosis not present

## 2023-09-06 DIAGNOSIS — M1380 Other specified arthritis, unspecified site: Secondary | ICD-10-CM | POA: Diagnosis not present

## 2023-09-06 DIAGNOSIS — R2681 Unsteadiness on feet: Secondary | ICD-10-CM | POA: Diagnosis not present

## 2023-09-12 DIAGNOSIS — R488 Other symbolic dysfunctions: Secondary | ICD-10-CM | POA: Diagnosis not present

## 2023-09-12 DIAGNOSIS — R41841 Cognitive communication deficit: Secondary | ICD-10-CM | POA: Diagnosis not present

## 2023-09-12 DIAGNOSIS — R2681 Unsteadiness on feet: Secondary | ICD-10-CM | POA: Diagnosis not present

## 2023-09-12 DIAGNOSIS — M6259 Muscle wasting and atrophy, not elsewhere classified, multiple sites: Secondary | ICD-10-CM | POA: Diagnosis not present

## 2023-09-12 DIAGNOSIS — M1380 Other specified arthritis, unspecified site: Secondary | ICD-10-CM | POA: Diagnosis not present

## 2023-09-12 DIAGNOSIS — R2689 Other abnormalities of gait and mobility: Secondary | ICD-10-CM | POA: Diagnosis not present

## 2023-09-14 DIAGNOSIS — M6259 Muscle wasting and atrophy, not elsewhere classified, multiple sites: Secondary | ICD-10-CM | POA: Diagnosis not present

## 2023-09-14 DIAGNOSIS — R2681 Unsteadiness on feet: Secondary | ICD-10-CM | POA: Diagnosis not present

## 2023-09-14 DIAGNOSIS — R488 Other symbolic dysfunctions: Secondary | ICD-10-CM | POA: Diagnosis not present

## 2023-09-14 DIAGNOSIS — M1380 Other specified arthritis, unspecified site: Secondary | ICD-10-CM | POA: Diagnosis not present

## 2023-09-14 DIAGNOSIS — R2689 Other abnormalities of gait and mobility: Secondary | ICD-10-CM | POA: Diagnosis not present

## 2023-09-14 DIAGNOSIS — R41841 Cognitive communication deficit: Secondary | ICD-10-CM | POA: Diagnosis not present

## 2023-09-19 DIAGNOSIS — R2681 Unsteadiness on feet: Secondary | ICD-10-CM | POA: Diagnosis not present

## 2023-09-19 DIAGNOSIS — M1380 Other specified arthritis, unspecified site: Secondary | ICD-10-CM | POA: Diagnosis not present

## 2023-09-19 DIAGNOSIS — M6259 Muscle wasting and atrophy, not elsewhere classified, multiple sites: Secondary | ICD-10-CM | POA: Diagnosis not present

## 2023-09-19 DIAGNOSIS — R41841 Cognitive communication deficit: Secondary | ICD-10-CM | POA: Diagnosis not present

## 2023-09-19 DIAGNOSIS — R488 Other symbolic dysfunctions: Secondary | ICD-10-CM | POA: Diagnosis not present

## 2023-09-19 DIAGNOSIS — R2689 Other abnormalities of gait and mobility: Secondary | ICD-10-CM | POA: Diagnosis not present

## 2023-09-21 ENCOUNTER — Telehealth: Payer: Self-pay | Admitting: Family Medicine

## 2023-09-21 DIAGNOSIS — R2689 Other abnormalities of gait and mobility: Secondary | ICD-10-CM | POA: Diagnosis not present

## 2023-09-21 DIAGNOSIS — R2681 Unsteadiness on feet: Secondary | ICD-10-CM | POA: Diagnosis not present

## 2023-09-21 NOTE — Telephone Encounter (Signed)
Pt called our office with concerns/issues with Dr. Everardo All office. Pt states no one has called him with results of MRI and even though pt has an appointment scheduled for 11/21 he has not spoken with anyone to confirm this due to not being able to get through to that office. Pt would like someone from Dr. Everardo All office to call and follow up with him on these concerns along with imaging results (he is not to happy).

## 2023-09-21 NOTE — Telephone Encounter (Signed)
Pt would like a call back with imaging and test results.

## 2023-09-25 DIAGNOSIS — R2681 Unsteadiness on feet: Secondary | ICD-10-CM | POA: Diagnosis not present

## 2023-09-25 DIAGNOSIS — R2689 Other abnormalities of gait and mobility: Secondary | ICD-10-CM | POA: Diagnosis not present

## 2023-09-27 DIAGNOSIS — R2689 Other abnormalities of gait and mobility: Secondary | ICD-10-CM | POA: Diagnosis not present

## 2023-09-27 DIAGNOSIS — R2681 Unsteadiness on feet: Secondary | ICD-10-CM | POA: Diagnosis not present

## 2023-10-03 DIAGNOSIS — R2681 Unsteadiness on feet: Secondary | ICD-10-CM | POA: Diagnosis not present

## 2023-10-03 DIAGNOSIS — R2689 Other abnormalities of gait and mobility: Secondary | ICD-10-CM | POA: Diagnosis not present

## 2023-10-05 DIAGNOSIS — R2681 Unsteadiness on feet: Secondary | ICD-10-CM | POA: Diagnosis not present

## 2023-10-05 DIAGNOSIS — R2689 Other abnormalities of gait and mobility: Secondary | ICD-10-CM | POA: Diagnosis not present

## 2023-10-08 DIAGNOSIS — R2681 Unsteadiness on feet: Secondary | ICD-10-CM | POA: Diagnosis not present

## 2023-10-08 DIAGNOSIS — R2689 Other abnormalities of gait and mobility: Secondary | ICD-10-CM | POA: Diagnosis not present

## 2023-10-11 ENCOUNTER — Ambulatory Visit (INDEPENDENT_AMBULATORY_CARE_PROVIDER_SITE_OTHER): Payer: Medicare Other | Admitting: Physician Assistant

## 2023-10-11 VITALS — BP 135/75 | HR 73 | Resp 18 | Wt 169.0 lb

## 2023-10-11 DIAGNOSIS — R413 Other amnesia: Secondary | ICD-10-CM | POA: Diagnosis not present

## 2023-10-11 NOTE — Progress Notes (Signed)
Assessment/Plan:   Memory impairment   Allen Robinson is a very pleasant 89 y.o. RH male with a history of hypertension, hyperlipidemia, gout, BPH, GERD, severe HOH insulin resistance with peripheral neuropathy, vitamin D deficiency, seen today for evaluation of memory loss  seen today in follow up to discuss the MRI of the brain results from 08/29/23. These were personally reviewed, remarkable for progressive, moderate generalized atrophy and white matter disease likely due to chronic microvascular ischemia, normal cerebellum and midline structures, no acute findings. Degenerative changes in the C spine noted, chronic. Patient is not on antidementia medications given his advance age as the risks would outweigh the benefits of the medicine. This patient is here alone  Previous records as well as any outside records available were reviewed prior to todays visit.     Follow up as needed Recommend good control of cardiovascular risk factors Continue to control mood as per PCP Recommend hearing check to increase comprehension. No indication for antidementia medication as the risks outweigh the benefits of the medicine     Chronic balance issues and frequent falls    He has a history of prediabetes peripheral neuropathy, and  chronic proprioception issues . MRI brain negative for findings that could contribute to symptoms    He had a [facility] at the [facility] to evaluate for Parkinson's in August 2019, with negative results Continue PT at the [place] Recommend using walker to prevent falls    Initial visit 06/2023 How long did patient have memory difficulties?[name]  Over the last 6 months.  Had COVID in July 2024, but his memory issues were present prior to it.  Patient has difficulty remembering recent conversations and people names. "He had been so good with names and faces before"-wife says  repeats oneself? "Not too much". Likes to read, [employer],  watch TV Disoriented when walking into a room?  Denies   Leaving objects in unusual places?   Might misplace but not in unusual locations    Wandering behavior? denies   Any personality changes ?  He is more anxious, worried than before.  Any history of depression?: Denies.   Hallucinations or paranoia?  Denies   Seizures? denies    Any sleep changes?  Gets up to urinate, but denies any issues Denies vivid dreams, REM behavior or sleepwalking   Sleep apnea? denies   Any hygiene concerns?  denies   Independent of bathing and dressing?  Endorsed  Does the patient need help with medications?  Patient in charge   Who is in charge of finances? Patient is in charge, but he may forget to pay some bills and his wife has noted more issues with finances  Any changes in appetite?  He has occasional hand tremor at times which may interfere with using utensils    Patient have trouble swallowing?  denies   Does the patient cook?  No   Any headaches?  denies   Chronic back pain?  denies   Ambulates with difficulty?  "Sometimes the left hill will drag, not always ". needs a walker to ambulate     Recent falls or head injuries? Has been falling more frequently for 1 year. He did hit his head and one time he broke his nose.  Vision changes? Cataract removal.  Stroke like symptoms?  denies   Any tremors? Occasional, for the last year "or even longer but not today"-wife says  Any anosmia?  denies   Any incontinence of urine? I get up  a couple of times at night, does not need a pad Any bowel dysfunction? Intermittent constipation  Patient lives  [place]    History of heavy alcohol intake? denies   History of heavy tobacco use? denies   Family history of dementia?   Denies    Does patient drive?  No longer drives, her reports that he was losing sense of direction    CURRENT MEDICATIONS:  Outpatient Encounter Medications as of 10/11/2023  Medication Sig   allopurinol (ZYLOPRIM) 100 MG tablet TAKE TWO TABLETS BY MOUTH DAILY TO PREVENT GOUT, NOT FOR  ACUTE ATTACKS, MUST TAKE EVERY DAY   colchicine 0.6 MG tablet Take 0.6 mg by mouth as needed.   finasteride (PROSCAR) 5 MG tablet Take 1 tablet by mouth daily.   Multiple Vitamin (MULTIVITAMIN ADULT PO) Take by mouth.   omeprazole (PRILOSEC) 20 MG capsule Take 20 mg by mouth daily as needed.    simvastatin (ZOCOR) 40 MG tablet Take 20 mg by mouth at bedtime.    terazosin (HYTRIN) 2 MG capsule Take 2 mg by mouth at bedtime. (Patient not taking: Reported on 05/25/2023)   No facility-administered encounter medications on file as of 10/11/2023.       07/11/2023    7:00 PM 04/18/2018    8:51 AM 03/14/2017    2:46 PM  MMSE - Mini Mental State Exam  Not completed:  -- --  Orientation to time 3    Orientation to Place 3    Registration 3    Attention/ Calculation 4    Recall 0    Language- name 2 objects 2    Language- repeat 1    Language- follow 3 step command 3    Language- read & follow direction 1    Write a sentence 1    Copy design 1    Total score 22         No data to display         Thank you for allowing [place] the opportunity to participate in the care of this nice patient. Please do not hesitate to contact us for any questions or concerns.   Total time spent on today's visit was 25 minutes dedicated to this patient today, preparing to see patient, examining the patient, ordering tests and/or medications and counseling the patient, documenting clinical information in the EHR or other health record, independently interpreting results and communicating results to the patient/family, discussing treatment and goals, answering patient's questions and coordinating care.  Cc:  Shelva Majestic, MD  Marlowe Kays 10/11/2023 6:14 AM

## 2023-10-11 NOTE — Patient Instructions (Signed)
It was a pleasure to see you today at our office.   Recommendations:   Take good care of the blood pressure, cholesterol, sugars  Continue Physical Therapy and Speech therapy  Monitor driving.   For psychiatric meds, mood meds: Please have your primary care physician manage these medications.  If you have any severe symptoms of a stroke, or other severe issues such as confusion,severe chills or fever, etc call 911 or go to the ER as you may need to be evaluated further    For assessment of decision of mental capacity and competency:  Call Dr. Erick Blinks, geriatric psychiatrist at [phone]  Counseling regarding caregiver distress, including caregiver depression, anxiety and issues regarding community resources, adult day care programs, adult living facilities, or memory care questions:  please contact your  Primary Doctor's Social Worker   Whom to call: Memory  decline, memory medications: Call our office [phone]    [email or web address]   RECOMMENDATIONS FOR ALL PATIENTS WITH MEMORY PROBLEMS: 1. Continue to exercise (Recommend 30 minutes of walking everyday, or 3 hours every week) 2. Increase social interactions - continue going to [place] and enjoy social gatherings with friends and family 3. Eat healthy, avoid fried foods and eat more fruits and vegetables 4. Maintain adequate blood pressure, blood sugar, and blood cholesterol level. Reducing the risk of stroke and cardiovascular disease also helps promoting better memory. 5. Avoid stressful situations. Live a simple life and avoid aggravations. Organize your time and prepare for the next day in anticipation. 6. Sleep well, avoid any interruptions of sleep and avoid any distractions in the bedroom that may interfere with adequate sleep quality 7. Avoid sugar, avoid sweets as there is a strong link between excessive sugar intake, diabetes, and cognitive impairment We  discussed the Mediterranean diet, which has been shown to help patients reduce the risk of progressive memory disorders and reduces cardiovascular risk. This includes eating fish, eat fruits and green leafy vegetables, nuts like almonds and hazelnuts, walnuts, and also use olive oil. Avoid fast foods and fried foods as much as possible. Avoid sweets and sugar as sugar use has been linked to worsening of memory function.  There is always a concern of gradual progression of memory problems. If this is the case, then we may need to adjust level of care according to patient needs. Support, both to the patient and caregiver, should then be put into place.          FALL PRECAUTIONS: Be cautious when walking. Scan the area for obstacles that may increase the risk of trips and falls. When getting up in the mornings, sit up at the edge of the bed for a few minutes before getting out of bed. Consider elevating the bed at the head end to avoid drop of blood pressure when getting up. Walk always in a well-lit room (use night lights in the walls). Avoid area rugs or power cords from appliances in the middle of the walkways. Use a walker or a cane if necessary and consider physical therapy for balance exercise. Get your eyesight checked regularly.  FINANCIAL OVERSIGHT: Supervision, especially oversight when making financial decisions or transactions is also recommended.  HOME SAFETY: Consider the safety of the kitchen when operating appliances like stoves, microwave oven, and blender. Consider having supervision and share cooking responsibilities until no longer able to participate in those. Accidents with firearms and other hazards in the house should be identified and addressed as well.   ABILITY TO BE LEFT ALONE:  If patient is unable to contact 911 operator, consider using LifeLine, or when the need is there, arrange for someone to stay with patients. Smoking is a fire hazard, consider supervision or cessation.  Risk of wandering should be assessed by caregiver and if detected at any point, supervision and safe proof recommendations should be instituted.  MEDICATION SUPERVISION: Inability to self-administer medication needs to be constantly addressed. Implement a mechanism to ensure safe administration of the medications.      Mediterranean Diet A Mediterranean diet refers to food and lifestyle choices that are based on the traditions of countries located on the [employer]. This way of eating has been shown to help prevent certain conditions and improve outcomes for people who have chronic diseases, like kidney disease and heart disease. What are tips for following this plan? Lifestyle  Cook and eat meals together with your family, when possible. Drink enough fluid to keep your urine clear or pale yellow. Be physically active every day. This includes: Aerobic exercise like running or swimming. Leisure activities like gardening, walking, or housework. Get 7-8 hours of sleep each night. If recommended by your health care provider, drink red wine in moderation. This means 1 glass a day for nonpregnant women and 2 glasses a day for men. A glass of wine equals 5 oz (150 mL). Reading food labels  Check the serving size of packaged foods. For foods such as rice and pasta, the serving size refers to the amount of cooked product, not dry. Check the total fat in packaged foods. Avoid foods that have saturated fat or trans fats. Check the ingredients list for added sugars, such as corn syrup. Shopping  At the grocery store, buy most of your food from the areas near the walls of the store. This includes: Fresh fruits and vegetables (produce). Grains, beans, nuts, and seeds. Some of these may be available in unpackaged forms or large amounts (in bulk). Fresh seafood. Poultry and eggs. Low-fat dairy products. Buy whole ingredients instead of prepackaged foods. Buy fresh fruits and vegetables  in-season from local farmers markets. Buy frozen fruits and vegetables in resealable bags. If you do not have access to quality fresh seafood, buy precooked frozen shrimp or canned fish, such as tuna, salmon, or sardines. Buy small amounts of raw or cooked vegetables, salads, or olives from the deli or salad bar at your store. Stock your pantry so you always have certain foods on hand, such as olive oil, canned tuna, canned tomatoes, rice, pasta, and beans. Cooking  Cook foods with extra-virgin olive oil instead of using butter or other vegetable oils. Have meat as a side dish, and have vegetables or grains as your main dish. This means having meat in small portions or adding small amounts of meat to foods like pasta or stew. Use beans or vegetables instead of meat in common dishes like chili or lasagna. Experiment with different cooking methods. Try roasting or broiling vegetables instead of steaming or sauteing them. Add frozen vegetables to soups, stews, pasta, or rice. Add nuts or seeds for added healthy fat at each meal. You can add these to yogurt, salads, or vegetable dishes. Marinate fish or vegetables using olive oil, lemon juice, garlic, and fresh herbs. Meal planning  Plan to eat 1 vegetarian meal one day each week. Try to work up to 2 vegetarian meals, if possible. Eat seafood 2 or more times a week. Have healthy snacks readily available, such as: Vegetable sticks with hummus. Greek yogurt. Fruit and  nut trail mix. Eat balanced meals throughout the week. This includes: Fruit: 2-3 servings a day Vegetables: 4-5 servings a day Low-fat dairy: 2 servings a day Fish, poultry, or lean meat: 1 serving a day Beans and legumes: 2 or more servings a week Nuts and seeds: 1-2 servings a day Whole grains: 6-8 servings a day Extra-virgin olive oil: 3-4 servings a day Limit red meat and sweets to only a few servings a month What are my food choices? Mediterranean  diet Recommended Grains: Whole-grain pasta. Brown rice. Bulgar wheat. Polenta. Couscous. Whole-wheat bread. Orpah Cobb. Vegetables: Artichokes. Beets. Broccoli. Cabbage. Carrots. Eggplant. Green beans. Chard. Kale. Spinach. Onions. Leeks. Peas. Squash. Tomatoes. Peppers. Radishes. Fruits: Apples. Apricots. Avocado. Berries. Bananas. Cherries. Dates. Figs. Grapes. Lemons. Melon. Oranges. Peaches. Plums. Pomegranate. Meats and other protein foods: Beans. Almonds. Sunflower seeds. Pine nuts. Peanuts. Cod. Salmon. Scallops. Shrimp. Tuna. Tilapia. Clams. Oysters. Eggs. Dairy: Low-fat milk. Cheese. Greek yogurt. Beverages: Water. Red wine. Herbal tea. Fats and oils: Extra virgin olive oil. Avocado oil. Grape seed oil. Sweets and desserts: [place] yogurt with honey. Baked apples. Poached pears. Trail mix. Seasoning and other foods: Basil. Cilantro. Coriander. Cumin. Mint. Parsley. Sage. Rosemary. Tarragon. Garlic. Oregano. Thyme. Pepper. Balsalmic vinegar. Tahini. Hummus. Tomato sauce. Olives. Mushrooms. Limit these Grains: Prepackaged pasta or rice dishes. Prepackaged cereal with added sugar. Vegetables: Deep fried potatoes (french fries). Fruits: Fruit canned in syrup. Meats and other protein foods: Beef. Pork. Lamb. Poultry with skin. Hot dogs. Tomasa Blase. Dairy: Ice cream. Sour cream. Whole milk. Beverages: Juice. Sugar-sweetened soft drinks. Beer. Liquor and spirits. Fats and oils: Butter. Canola oil. Vegetable oil. Beef fat (tallow). Lard. Sweets and desserts: Cookies. Cakes. Pies. Candy. Seasoning and other foods: Mayonnaise. Premade sauces and marinades. The items listed may not be a complete list. Talk with your dietitian about what dietary choices are right for you. Summary The Mediterranean diet includes both food and lifestyle choices. Eat a variety of fresh fruits and vegetables, beans, nuts, seeds, and whole grains. Limit the amount of red meat and sweets that you eat. Talk with your  health care provider about whether it is safe for you to drink red wine in moderation. This means 1 glass a day for nonpregnant women and 2 glasses a day for men. A glass of wine equals 5 oz (150 mL). This information is not intended to replace advice given to you by your health care provider. Make sure you discuss any questions you have with your health care provider. Document Released: 06/29/2016 Document Revised: 08/01/2016 Document Reviewed: 06/29/2016 Elsevier Interactive Patient Education  2017 [employer].

## 2023-10-12 DIAGNOSIS — R2681 Unsteadiness on feet: Secondary | ICD-10-CM | POA: Diagnosis not present

## 2023-10-12 DIAGNOSIS — R2689 Other abnormalities of gait and mobility: Secondary | ICD-10-CM | POA: Diagnosis not present

## 2023-10-15 ENCOUNTER — Ambulatory Visit (INDEPENDENT_AMBULATORY_CARE_PROVIDER_SITE_OTHER): Payer: Medicare Other | Admitting: Family Medicine

## 2023-10-15 ENCOUNTER — Encounter: Payer: Self-pay | Admitting: Family Medicine

## 2023-10-15 VITALS — BP 120/70 | HR 62 | Temp 97.6°F | Ht 70.0 in | Wt 165.6 lb

## 2023-10-15 DIAGNOSIS — K219 Gastro-esophageal reflux disease without esophagitis: Secondary | ICD-10-CM | POA: Diagnosis not present

## 2023-10-15 DIAGNOSIS — R413 Other amnesia: Secondary | ICD-10-CM | POA: Diagnosis not present

## 2023-10-15 DIAGNOSIS — R351 Nocturia: Secondary | ICD-10-CM

## 2023-10-15 DIAGNOSIS — E559 Vitamin D deficiency, unspecified: Secondary | ICD-10-CM

## 2023-10-15 DIAGNOSIS — E785 Hyperlipidemia, unspecified: Secondary | ICD-10-CM

## 2023-10-15 DIAGNOSIS — N401 Enlarged prostate with lower urinary tract symptoms: Secondary | ICD-10-CM

## 2023-10-15 DIAGNOSIS — M1A0790 Idiopathic chronic gout, unspecified ankle and foot, without tophus (tophi): Secondary | ICD-10-CM

## 2023-10-15 DIAGNOSIS — R7303 Prediabetes: Secondary | ICD-10-CM | POA: Diagnosis not present

## 2023-10-15 DIAGNOSIS — I700 Atherosclerosis of aorta: Secondary | ICD-10-CM | POA: Diagnosis not present

## 2023-10-15 LAB — VITAMIN B12: Vitamin B-12: 385 pg/mL (ref 211–911)

## 2023-10-15 LAB — TSH: TSH: 2.45 u[IU]/mL (ref 0.35–5.50)

## 2023-10-15 NOTE — Patient Instructions (Addendum)
Please stop by lab before you go If you have mychart- we will send your results within 3 business days of [place] receiving them.  If you do not have mychart- we will call you about results within 5 business days of [place] receiving them.  *please also note that you will see labs on mychart as soon as they post. I will later go in and write notes on them- will say "notes from Dr. Durene Cal"   I am sorry we did not get clear answers with neurology - some of this is likely aging related but we will check in on thyroid and B12 to see if we can modify at least some of it  Keep using the walker- great job!   Recommended follow up: Return in about 6 months (around 04/13/2024) for followup or sooner if needed.Schedule b4 you leave.

## 2023-10-15 NOTE — Progress Notes (Signed)
Phone [phone] In person visit   Subjective:   Allen Robinson is a 89 y.o. year old very pleasant male patient who presents for/with See problem oriented charting Chief Complaint  Patient presents with   Medical Management of Chronic Issues   Hyperlipidemia    Past Medical History-  Patient Active Problem List   Diagnosis Date Noted   Aortic atherosclerosis (HCC) 05/21/2019    Priority: Medium    Gout 03/14/2017    Priority: Medium    BPH associated with nocturia 03/14/2017    Priority: Medium    Hyperlipidemia 03/14/2017    Priority: Medium    GERD (gastroesophageal reflux disease) 03/14/2017    Priority: Medium    Senile purpura (HCC) 05/03/2020    Priority: Low   Right rib fracture 05/15/2019    Priority: Low   Hyperglycemia 04/28/2019    Priority: Low   Vitamin D deficiency 03/14/2017    Priority: Low   Peripheral polyneuropathy 02/16/2016    Priority: Low   Diplopia 11/19/2015    Priority: Low    Medications- reviewed and updated Current Outpatient Medications  Medication Sig Dispense Refill   allopurinol (ZYLOPRIM) 100 MG tablet TAKE TWO TABLETS BY MOUTH DAILY TO PREVENT GOUT, NOT FOR ACUTE ATTACKS, MUST TAKE EVERY DAY     colchicine 0.6 MG tablet Take 0.6 mg by mouth as needed.     Multiple Vitamin (MULTIVITAMIN ADULT PO) Take by mouth.     omeprazole (PRILOSEC) 20 MG capsule Take 20 mg by mouth daily as needed.      simvastatin (ZOCOR) 40 MG tablet Take 20 mg by mouth at bedtime.      No current facility-administered medications for this visit.     Objective:  BP 120/70   Pulse 62   Temp 97.6 F (36.4 C)   Ht 5\' 10"  (1.778 m)   Wt 165 lb 9.6 oz (75.1 kg)   SpO2 97%   BMI 23.76 kg/m  Gen: NAD, resting comfortably CV: RRR no murmurs rubs or gallops Lungs: CTAB no crackles, wheeze, rhonchi Ext: trace edema Skin: warm, dry    Assessment and Plan   #memory loss- has seen Durenda Age with neurology since our last visit - was not  diagnosed as dementia and at his age recommended hold off on antidementia medicines - basically released -MRI  brain 07/20/23 with largely age related changes  -they also did not have much to add for balance issues- [facility] (has done in past) and recommend walker - at least using walker today -has not had B12 or TSH checked  # Hyperlipidemia/aortic atherosclerosis - incidental finding on prior imaging with ideal LDL goal under 70 S:Medication - Simvastatin 40 mg- takes half tablet A/P: last LDL at 99- team will abstract- reasonable control for his age even with aortic atherosclerosis- higher dose would increase diabetes risk as well   #Gout-uric acid has been below 6 S: no flares on Allopurinol 100 mg tablet.  Has colchicine available if needed A/P:no recent flares- doing well- continue current medications     # GERD S:Medication -  Omeprazole 20 mg  Lab Results  Component Value Date   VITAMINB12 578 01/24/2021  A/P: stable- continue current medicines  -check B12 with memory loss   #BPH S: Medication:none  - Finasteride 5 mg from the [place], terazosin 2 mg- both stopped with fall risk - prior on Vesicare A/P: some overflow incontinence off of medications but reports they  were stopped eut to fall risk  # Hyperglycemia/insulin resistance/prediabetes-peak recent A1c 6.3 in 2020 S: last a1c brought in from [place] noted at 6.1 I believe- team will abstract  A/P: has been stable- continue to monitor    #Vitamin D deficiency S: Medication: Patient takes 10,000 units daily in past- on multivitamin now A/P: levels over 40 recently- continue current medications    # Constipation-sparing MiraLAX if needed or bulking fruit.  Colace if needed.   Recommended follow up: Return in about 6 months (around 04/13/2024) for followup or sooner if needed.Schedule b4 you leave.  Lab/Order associations:   ICD-10-CM   1. Memory impairment  R41.3 TSH    Vitamin B12     2. Hyperlipidemia, unspecified hyperlipidemia type  E78.5 TSH    3. BPH associated with nocturia  N40.1    R35.1     4. Gastroesophageal reflux disease without esophagitis  K21.9     5. Chronic idiopathic gout involving toe without tophus, unspecified laterality  M1A.0790     6. Aortic atherosclerosis (HCC)  I70.0     7. Prediabetes  R73.03     8. Vitamin D deficiency  E55.9       No orders of the defined types were placed in this encounter.   Return precautions advised.  Tana Conch, MD

## 2023-10-16 DIAGNOSIS — R2681 Unsteadiness on feet: Secondary | ICD-10-CM | POA: Diagnosis not present

## 2023-10-16 DIAGNOSIS — R2689 Other abnormalities of gait and mobility: Secondary | ICD-10-CM | POA: Diagnosis not present

## 2023-10-19 DIAGNOSIS — R2689 Other abnormalities of gait and mobility: Secondary | ICD-10-CM | POA: Diagnosis not present

## 2023-10-19 DIAGNOSIS — R2681 Unsteadiness on feet: Secondary | ICD-10-CM | POA: Diagnosis not present

## 2023-10-23 DIAGNOSIS — R2689 Other abnormalities of gait and mobility: Secondary | ICD-10-CM | POA: Diagnosis not present

## 2023-10-23 DIAGNOSIS — R2681 Unsteadiness on feet: Secondary | ICD-10-CM | POA: Diagnosis not present

## 2023-10-23 DIAGNOSIS — R4789 Other speech disturbances: Secondary | ICD-10-CM | POA: Diagnosis not present

## 2023-10-23 DIAGNOSIS — R488 Other symbolic dysfunctions: Secondary | ICD-10-CM | POA: Diagnosis not present

## 2023-10-25 DIAGNOSIS — R488 Other symbolic dysfunctions: Secondary | ICD-10-CM | POA: Diagnosis not present

## 2023-10-25 DIAGNOSIS — R2681 Unsteadiness on feet: Secondary | ICD-10-CM | POA: Diagnosis not present

## 2023-10-25 DIAGNOSIS — R4789 Other speech disturbances: Secondary | ICD-10-CM | POA: Diagnosis not present

## 2023-10-25 DIAGNOSIS — R2689 Other abnormalities of gait and mobility: Secondary | ICD-10-CM | POA: Diagnosis not present

## 2023-10-26 DIAGNOSIS — R2689 Other abnormalities of gait and mobility: Secondary | ICD-10-CM | POA: Diagnosis not present

## 2023-10-26 DIAGNOSIS — R4789 Other speech disturbances: Secondary | ICD-10-CM | POA: Diagnosis not present

## 2023-10-26 DIAGNOSIS — R2681 Unsteadiness on feet: Secondary | ICD-10-CM | POA: Diagnosis not present

## 2023-10-26 DIAGNOSIS — R488 Other symbolic dysfunctions: Secondary | ICD-10-CM | POA: Diagnosis not present

## 2023-10-29 DIAGNOSIS — R2681 Unsteadiness on feet: Secondary | ICD-10-CM | POA: Diagnosis not present

## 2023-10-29 DIAGNOSIS — R4789 Other speech disturbances: Secondary | ICD-10-CM | POA: Diagnosis not present

## 2023-10-29 DIAGNOSIS — R2689 Other abnormalities of gait and mobility: Secondary | ICD-10-CM | POA: Diagnosis not present

## 2023-10-29 DIAGNOSIS — R488 Other symbolic dysfunctions: Secondary | ICD-10-CM | POA: Diagnosis not present

## 2023-10-30 ENCOUNTER — Telehealth: Payer: Self-pay | Admitting: Family Medicine

## 2023-10-30 NOTE — Telephone Encounter (Signed)
Patient would like for someone from the office to give him a call regarding scheduling an appt ASAP w/Dr Durene Cal in reference to the referral Dr Durene Cal sent him to a neurologist.    Can someone give him a call back.  Thank you Allen Robinson [facility] TEAM Direct Dial [phone]

## 2023-10-30 NOTE — Telephone Encounter (Signed)
Called pt and LVM for scheduling.

## 2023-11-01 DIAGNOSIS — R488 Other symbolic dysfunctions: Secondary | ICD-10-CM | POA: Diagnosis not present

## 2023-11-01 DIAGNOSIS — R4789 Other speech disturbances: Secondary | ICD-10-CM | POA: Diagnosis not present

## 2023-11-01 DIAGNOSIS — R2689 Other abnormalities of gait and mobility: Secondary | ICD-10-CM | POA: Diagnosis not present

## 2023-11-01 DIAGNOSIS — R2681 Unsteadiness on feet: Secondary | ICD-10-CM | POA: Diagnosis not present

## 2023-11-02 DIAGNOSIS — R2689 Other abnormalities of gait and mobility: Secondary | ICD-10-CM | POA: Diagnosis not present

## 2023-11-02 DIAGNOSIS — R2681 Unsteadiness on feet: Secondary | ICD-10-CM | POA: Diagnosis not present

## 2023-11-02 DIAGNOSIS — R4789 Other speech disturbances: Secondary | ICD-10-CM | POA: Diagnosis not present

## 2023-11-02 DIAGNOSIS — R488 Other symbolic dysfunctions: Secondary | ICD-10-CM | POA: Diagnosis not present

## 2023-11-12 ENCOUNTER — Telehealth: Payer: Self-pay

## 2023-11-12 NOTE — Telephone Encounter (Signed)
Copied from CRM [id number]. Topic: Clinical - Medical Advice >> Nov 12, 2023  9:50 AM Isabell A wrote: Reason for CRM: Mattie Marlin (daughter) is calling in regard to patient experiencing anxiety, Allen Robinson states patient already seen neurologist - she would like to know if there is anything that can be prescribed to help that will not affect his balance.  Callback number: [phone] - OK to leave voicemail.  Pt has ov with you on 11/29/23, can this be addressed then or does he need to see another provider sooner?

## 2023-11-15 NOTE — Telephone Encounter (Signed)
If he's ok waiting I think discussing in person on 11/29/23 makes the most sense

## 2023-11-19 DIAGNOSIS — R2681 Unsteadiness on feet: Secondary | ICD-10-CM | POA: Diagnosis not present

## 2023-11-19 DIAGNOSIS — R2689 Other abnormalities of gait and mobility: Secondary | ICD-10-CM | POA: Diagnosis not present

## 2023-11-19 DIAGNOSIS — R488 Other symbolic dysfunctions: Secondary | ICD-10-CM | POA: Diagnosis not present

## 2023-11-19 DIAGNOSIS — R4789 Other speech disturbances: Secondary | ICD-10-CM | POA: Diagnosis not present

## 2023-11-20 DIAGNOSIS — R2681 Unsteadiness on feet: Secondary | ICD-10-CM | POA: Diagnosis not present

## 2023-11-20 DIAGNOSIS — R4789 Other speech disturbances: Secondary | ICD-10-CM | POA: Diagnosis not present

## 2023-11-20 DIAGNOSIS — R488 Other symbolic dysfunctions: Secondary | ICD-10-CM | POA: Diagnosis not present

## 2023-11-20 DIAGNOSIS — R2689 Other abnormalities of gait and mobility: Secondary | ICD-10-CM | POA: Diagnosis not present

## 2023-11-22 DIAGNOSIS — R2681 Unsteadiness on feet: Secondary | ICD-10-CM | POA: Diagnosis not present

## 2023-11-22 DIAGNOSIS — R4789 Other speech disturbances: Secondary | ICD-10-CM | POA: Diagnosis not present

## 2023-11-22 DIAGNOSIS — R2689 Other abnormalities of gait and mobility: Secondary | ICD-10-CM | POA: Diagnosis not present

## 2023-11-22 DIAGNOSIS — R488 Other symbolic dysfunctions: Secondary | ICD-10-CM | POA: Diagnosis not present

## 2023-11-23 DIAGNOSIS — R4789 Other speech disturbances: Secondary | ICD-10-CM | POA: Diagnosis not present

## 2023-11-23 DIAGNOSIS — R2689 Other abnormalities of gait and mobility: Secondary | ICD-10-CM | POA: Diagnosis not present

## 2023-11-23 DIAGNOSIS — R2681 Unsteadiness on feet: Secondary | ICD-10-CM | POA: Diagnosis not present

## 2023-11-23 DIAGNOSIS — R488 Other symbolic dysfunctions: Secondary | ICD-10-CM | POA: Diagnosis not present

## 2023-11-26 DIAGNOSIS — R2689 Other abnormalities of gait and mobility: Secondary | ICD-10-CM | POA: Diagnosis not present

## 2023-11-26 DIAGNOSIS — R2681 Unsteadiness on feet: Secondary | ICD-10-CM | POA: Diagnosis not present

## 2023-11-26 DIAGNOSIS — R4789 Other speech disturbances: Secondary | ICD-10-CM | POA: Diagnosis not present

## 2023-11-26 DIAGNOSIS — R488 Other symbolic dysfunctions: Secondary | ICD-10-CM | POA: Diagnosis not present

## 2023-11-27 DIAGNOSIS — R488 Other symbolic dysfunctions: Secondary | ICD-10-CM | POA: Diagnosis not present

## 2023-11-27 DIAGNOSIS — R2681 Unsteadiness on feet: Secondary | ICD-10-CM | POA: Diagnosis not present

## 2023-11-27 DIAGNOSIS — R2689 Other abnormalities of gait and mobility: Secondary | ICD-10-CM | POA: Diagnosis not present

## 2023-11-27 DIAGNOSIS — R4789 Other speech disturbances: Secondary | ICD-10-CM | POA: Diagnosis not present

## 2023-11-28 ENCOUNTER — Telehealth: Payer: Self-pay

## 2023-11-28 DIAGNOSIS — R4789 Other speech disturbances: Secondary | ICD-10-CM | POA: Diagnosis not present

## 2023-11-28 DIAGNOSIS — R2689 Other abnormalities of gait and mobility: Secondary | ICD-10-CM | POA: Diagnosis not present

## 2023-11-28 DIAGNOSIS — R488 Other symbolic dysfunctions: Secondary | ICD-10-CM | POA: Diagnosis not present

## 2023-11-28 DIAGNOSIS — R2681 Unsteadiness on feet: Secondary | ICD-10-CM | POA: Diagnosis not present

## 2023-11-28 NOTE — Telephone Encounter (Signed)
 A urine specimen can be collected at visit tomorrow.  Copied from CRM [id number]. Topic: Clinical - Request for Lab/Test Order >> Nov 28, 2023 10:16 AM Annabella DEL wrote: Reason for CRM: Patient's daughter Gustav called to advise that patient has experienced some confusion as of late. Patient is scheduled to see Dr. Katrinka tomorrow. She would like a lab ordered to rule out a UTI just in case. Please order labs to be attached to tomorrow's visit.

## 2023-11-29 ENCOUNTER — Encounter: Payer: Self-pay | Admitting: Family Medicine

## 2023-11-29 ENCOUNTER — Ambulatory Visit (INDEPENDENT_AMBULATORY_CARE_PROVIDER_SITE_OTHER): Payer: Medicare Other | Admitting: Family Medicine

## 2023-11-29 VITALS — BP 120/74 | HR 88 | Temp 97.1°F | Ht 70.0 in | Wt 168.2 lb

## 2023-11-29 DIAGNOSIS — M1A0790 Idiopathic chronic gout, unspecified ankle and foot, without tophus (tophi): Secondary | ICD-10-CM | POA: Diagnosis not present

## 2023-11-29 DIAGNOSIS — R413 Other amnesia: Secondary | ICD-10-CM | POA: Insufficient documentation

## 2023-11-29 DIAGNOSIS — I700 Atherosclerosis of aorta: Secondary | ICD-10-CM | POA: Diagnosis not present

## 2023-11-29 DIAGNOSIS — E785 Hyperlipidemia, unspecified: Secondary | ICD-10-CM | POA: Diagnosis not present

## 2023-11-29 DIAGNOSIS — F419 Anxiety disorder, unspecified: Secondary | ICD-10-CM | POA: Diagnosis not present

## 2023-11-29 DIAGNOSIS — F0390 Unspecified dementia without behavioral disturbance: Secondary | ICD-10-CM | POA: Insufficient documentation

## 2023-11-29 MED ORDER — ESCITALOPRAM OXALATE 5 MG PO TABS: 5.0000 mg | ORAL_TABLET | Freq: Every day | ORAL | 5 refills | Status: DC

## 2023-11-29 NOTE — Patient Instructions (Addendum)
 Start escitalopram /Lexapro  5 mg daily to see if that helps with anxiety/agitation  Taking the medicine as directed and not missing any doses is one of the best things you can do to treat your anxiety/agitation.  Here are some things to keep in mind:  Side effects (stomach upset, some increased anxiety) may happen before you notice a benefit.  These side effects typically go away over time. Changes to your dose of medicine or a change in medication all together is sometimes necessary Most people need to be on medication at least 6-12 months Many people will notice an improvement within two weeks but the full effect of the medication can take up to 4-6 weeks Stopping the medication when you start feeling better often results in a return of symptoms If you start having thoughts of hurting yourself or others after starting this medicine, call our office immediately at [phone] or seek care through 911.     Recommended follow up: Return in about 6 weeks (around 01/10/2024) for followup or sooner if needed.Schedule b4 you leave.

## 2023-11-29 NOTE — Progress Notes (Signed)
 Phone [phone] In person visit   Subjective:   Allen Robinson is a 89 y.o. year old very pleasant male patient who presents for/with See problem oriented charting Chief Complaint  Patient presents with   discuss neuro appt   Past Medical History-  Patient Active Problem List   Diagnosis Date Noted   Aortic atherosclerosis (HCC) 05/21/2019    Priority: Medium    Gout 03/14/2017    Priority: Medium    BPH associated with nocturia 03/14/2017    Priority: Medium    Hyperlipidemia 03/14/2017    Priority: Medium    GERD (gastroesophageal reflux disease) 03/14/2017    Priority: Medium    Senile purpura (HCC) 05/03/2020    Priority: Low   Right rib fracture 05/15/2019    Priority: Low   Hyperglycemia 04/28/2019    Priority: Low   Vitamin D  deficiency 03/14/2017    Priority: Low   Peripheral polyneuropathy 02/16/2016    Priority: Low   Diplopia 11/19/2015    Priority: Low   Memory loss 11/29/2023    Medications- reviewed and updated Current Outpatient Medications  Medication Sig Dispense Refill   allopurinol  (ZYLOPRIM ) 100 MG tablet TAKE TWO TABLETS BY MOUTH DAILY TO PREVENT GOUT, NOT FOR ACUTE ATTACKS, MUST TAKE EVERY DAY     colchicine  0.6 MG tablet Take 0.6 mg by mouth as needed.     escitalopram  (LEXAPRO ) 5 MG tablet Take 1 tablet (5 mg total) by mouth daily. 30 tablet 5   Multiple Vitamin (MULTIVITAMIN ADULT PO) Take by mouth.     omeprazole (PRILOSEC) 20 MG capsule Take 20 mg by mouth daily as needed.      simvastatin  (ZOCOR ) 40 MG tablet Take 20 mg by mouth at bedtime.      No current facility-administered medications for this visit.     Objective:  BP 120/74   Pulse 88   Temp (!) 97.1 F (36.2 C)   Ht 5' 10 (1.778 m)   Wt 168 lb 3.2 oz (76.3 kg)   SpO2 98%   BMI 24.13 kg/m  Gen: NAD, resting comfortably CV: RRR no murmurs rubs or gallops Lungs: CTAB no crackles, wheeze, rhonchi Ext: minimal edema Skin: warm, dry Neuro: walks with walker     Assessment and Plan    #memory loss- has seen Lauraine Sevin with neurology  # Balance issue S:  was not diagnosed as dementia and at his age recommended hold off on antidementia medicines- they were given info on [facility]. Olivia Nine but initial cost $1500 and they opted out. No further [place] neurolgoy follow up has been planned - basically released -MRI  brain 07/20/23 without obvious cause  -[facility]- sounds [facility] from June 21, 2018 -they also did not have much to add for balance issues- [facility] (has done in past) and recommend walker. He's using walker and doing physical therapy at [facility]  Today reports ongoing but not worsening memory issues. Wife and he have noted that he gets agitated and doesn't rest well at times.  Does not know directions like he once did-no longer driving.  Feels he has trouble finding a word that is common to him -EKG not prolonged 05/15/2019 A/P: Ongoing memory loss issues as well as balance issues (proprioception issues with neuropathy likely contributory).  Some of this may be age-related but family would like to increase  confidence in this-[facility] - [facility] at [facility] since that has been helpful.  Continue walker - For agitation portion suspect mild anxiety as well as depression element.  No thoughts of self-harm reported.  Recommended trial of Lexapro  5 mg versus buspirone and we ultimately opted to trial Lexapro  with 6-week follow-up  # Hyperlipidemia/aortic atherosclerosis - incidental finding on prior imaging with ideal LDL goal under 70 S:Medication - Simvastatin  40 mg- takes half Lab Results  Component Value Date   CHOL 160 08/20/2023   HDL 39 08/20/2023   LDLCALC 99 08/20/2023   TRIG 85 08/20/2023   CHOLHDL 3 11/03/2021  A/P: Above ideal goal with aortic  atherosclerosis and goal under 70 but at his age I certainly do not feel strongly about increasing dose of medicine-especially with memory loss reported.  Did think after visit about potentially changing to rosuvastatin  as crosses the blood-brain barrier last-discuss further at next visit but continue current medication for now  #Gout-uric acid has been below 6 S: no flares on Allopurinol  200 mg tablet.  Has colchicine  available if needed A/P: Gout doing well without recurrence-continue current medication  Recommended follow up: Return in about 6 weeks (around 01/10/2024) for followup or sooner if needed.Schedule b4 you leave. Future Appointments  Date Time Provider Department Center  12/31/2023  2:20 PM [facility] VISIT 1 [facility]  01/14/2024 11:00 AM Katrinka Garnette KIDD, MD [facility]  04/16/2024 10:20 AM Katrinka Garnette KIDD, MD [facility]   Lab/Order associations:   ICD-10-CM   1. Memory loss  R41.3     2. Anxiety  F41.9     3. Hyperlipidemia, unspecified hyperlipidemia type  E78.5     4. Aortic atherosclerosis (HCC)  I70.0     5. Chronic idiopathic gout involving toe without tophus, unspecified laterality  M1A.0790      Meds ordered this encounter  Medications   escitalopram  (LEXAPRO ) 5 MG tablet    Sig: Take 1 tablet (5 mg total) by mouth daily.    Dispense:  30 tablet    Refill:  5    Return precautions advised.  Garnette Katrinka, MD

## 2023-11-30 DIAGNOSIS — R488 Other symbolic dysfunctions: Secondary | ICD-10-CM | POA: Diagnosis not present

## 2023-11-30 DIAGNOSIS — R2689 Other abnormalities of gait and mobility: Secondary | ICD-10-CM | POA: Diagnosis not present

## 2023-11-30 DIAGNOSIS — R2681 Unsteadiness on feet: Secondary | ICD-10-CM | POA: Diagnosis not present

## 2023-11-30 DIAGNOSIS — R4789 Other speech disturbances: Secondary | ICD-10-CM | POA: Diagnosis not present

## 2023-12-04 DIAGNOSIS — R2681 Unsteadiness on feet: Secondary | ICD-10-CM | POA: Diagnosis not present

## 2023-12-04 DIAGNOSIS — R2689 Other abnormalities of gait and mobility: Secondary | ICD-10-CM | POA: Diagnosis not present

## 2023-12-04 DIAGNOSIS — R488 Other symbolic dysfunctions: Secondary | ICD-10-CM | POA: Diagnosis not present

## 2023-12-04 DIAGNOSIS — R4789 Other speech disturbances: Secondary | ICD-10-CM | POA: Diagnosis not present

## 2023-12-05 DIAGNOSIS — R2689 Other abnormalities of gait and mobility: Secondary | ICD-10-CM | POA: Diagnosis not present

## 2023-12-05 DIAGNOSIS — R2681 Unsteadiness on feet: Secondary | ICD-10-CM | POA: Diagnosis not present

## 2023-12-05 DIAGNOSIS — R488 Other symbolic dysfunctions: Secondary | ICD-10-CM | POA: Diagnosis not present

## 2023-12-05 DIAGNOSIS — R4789 Other speech disturbances: Secondary | ICD-10-CM | POA: Diagnosis not present

## 2023-12-07 DIAGNOSIS — R488 Other symbolic dysfunctions: Secondary | ICD-10-CM | POA: Diagnosis not present

## 2023-12-07 DIAGNOSIS — R2681 Unsteadiness on feet: Secondary | ICD-10-CM | POA: Diagnosis not present

## 2023-12-07 DIAGNOSIS — R4789 Other speech disturbances: Secondary | ICD-10-CM | POA: Diagnosis not present

## 2023-12-07 DIAGNOSIS — R2689 Other abnormalities of gait and mobility: Secondary | ICD-10-CM | POA: Diagnosis not present

## 2023-12-10 DIAGNOSIS — R4789 Other speech disturbances: Secondary | ICD-10-CM | POA: Diagnosis not present

## 2023-12-10 DIAGNOSIS — R2681 Unsteadiness on feet: Secondary | ICD-10-CM | POA: Diagnosis not present

## 2023-12-10 DIAGNOSIS — R2689 Other abnormalities of gait and mobility: Secondary | ICD-10-CM | POA: Diagnosis not present

## 2023-12-10 DIAGNOSIS — R488 Other symbolic dysfunctions: Secondary | ICD-10-CM | POA: Diagnosis not present

## 2023-12-12 DIAGNOSIS — R488 Other symbolic dysfunctions: Secondary | ICD-10-CM | POA: Diagnosis not present

## 2023-12-12 DIAGNOSIS — R2681 Unsteadiness on feet: Secondary | ICD-10-CM | POA: Diagnosis not present

## 2023-12-12 DIAGNOSIS — R4789 Other speech disturbances: Secondary | ICD-10-CM | POA: Diagnosis not present

## 2023-12-12 DIAGNOSIS — R2689 Other abnormalities of gait and mobility: Secondary | ICD-10-CM | POA: Diagnosis not present

## 2023-12-13 DIAGNOSIS — R4789 Other speech disturbances: Secondary | ICD-10-CM | POA: Diagnosis not present

## 2023-12-13 DIAGNOSIS — R488 Other symbolic dysfunctions: Secondary | ICD-10-CM | POA: Diagnosis not present

## 2023-12-13 DIAGNOSIS — R2681 Unsteadiness on feet: Secondary | ICD-10-CM | POA: Diagnosis not present

## 2023-12-13 DIAGNOSIS — R2689 Other abnormalities of gait and mobility: Secondary | ICD-10-CM | POA: Diagnosis not present

## 2023-12-14 DIAGNOSIS — R4789 Other speech disturbances: Secondary | ICD-10-CM | POA: Diagnosis not present

## 2023-12-14 DIAGNOSIS — R488 Other symbolic dysfunctions: Secondary | ICD-10-CM | POA: Diagnosis not present

## 2023-12-14 DIAGNOSIS — R2681 Unsteadiness on feet: Secondary | ICD-10-CM | POA: Diagnosis not present

## 2023-12-14 DIAGNOSIS — R2689 Other abnormalities of gait and mobility: Secondary | ICD-10-CM | POA: Diagnosis not present

## 2023-12-17 DIAGNOSIS — R2689 Other abnormalities of gait and mobility: Secondary | ICD-10-CM | POA: Diagnosis not present

## 2023-12-17 DIAGNOSIS — R4789 Other speech disturbances: Secondary | ICD-10-CM | POA: Diagnosis not present

## 2023-12-17 DIAGNOSIS — R488 Other symbolic dysfunctions: Secondary | ICD-10-CM | POA: Diagnosis not present

## 2023-12-17 DIAGNOSIS — R2681 Unsteadiness on feet: Secondary | ICD-10-CM | POA: Diagnosis not present

## 2023-12-18 DIAGNOSIS — R4789 Other speech disturbances: Secondary | ICD-10-CM | POA: Diagnosis not present

## 2023-12-18 DIAGNOSIS — R488 Other symbolic dysfunctions: Secondary | ICD-10-CM | POA: Diagnosis not present

## 2023-12-18 DIAGNOSIS — R2681 Unsteadiness on feet: Secondary | ICD-10-CM | POA: Diagnosis not present

## 2023-12-18 DIAGNOSIS — R2689 Other abnormalities of gait and mobility: Secondary | ICD-10-CM | POA: Diagnosis not present

## 2023-12-19 DIAGNOSIS — R488 Other symbolic dysfunctions: Secondary | ICD-10-CM | POA: Diagnosis not present

## 2023-12-19 DIAGNOSIS — R2689 Other abnormalities of gait and mobility: Secondary | ICD-10-CM | POA: Diagnosis not present

## 2023-12-19 DIAGNOSIS — R2681 Unsteadiness on feet: Secondary | ICD-10-CM | POA: Diagnosis not present

## 2023-12-19 DIAGNOSIS — R4789 Other speech disturbances: Secondary | ICD-10-CM | POA: Diagnosis not present

## 2023-12-21 DIAGNOSIS — R2681 Unsteadiness on feet: Secondary | ICD-10-CM | POA: Diagnosis not present

## 2023-12-21 DIAGNOSIS — R2689 Other abnormalities of gait and mobility: Secondary | ICD-10-CM | POA: Diagnosis not present

## 2023-12-21 DIAGNOSIS — R4789 Other speech disturbances: Secondary | ICD-10-CM | POA: Diagnosis not present

## 2023-12-21 DIAGNOSIS — R488 Other symbolic dysfunctions: Secondary | ICD-10-CM | POA: Diagnosis not present

## 2023-12-24 DIAGNOSIS — R4789 Other speech disturbances: Secondary | ICD-10-CM | POA: Diagnosis not present

## 2023-12-24 DIAGNOSIS — R2681 Unsteadiness on feet: Secondary | ICD-10-CM | POA: Diagnosis not present

## 2023-12-24 DIAGNOSIS — R488 Other symbolic dysfunctions: Secondary | ICD-10-CM | POA: Diagnosis not present

## 2023-12-24 DIAGNOSIS — R2689 Other abnormalities of gait and mobility: Secondary | ICD-10-CM | POA: Diagnosis not present

## 2023-12-26 DIAGNOSIS — R2681 Unsteadiness on feet: Secondary | ICD-10-CM | POA: Diagnosis not present

## 2023-12-26 DIAGNOSIS — R488 Other symbolic dysfunctions: Secondary | ICD-10-CM | POA: Diagnosis not present

## 2023-12-26 DIAGNOSIS — R4789 Other speech disturbances: Secondary | ICD-10-CM | POA: Diagnosis not present

## 2023-12-26 DIAGNOSIS — R2689 Other abnormalities of gait and mobility: Secondary | ICD-10-CM | POA: Diagnosis not present

## 2023-12-28 DIAGNOSIS — R2689 Other abnormalities of gait and mobility: Secondary | ICD-10-CM | POA: Diagnosis not present

## 2023-12-28 DIAGNOSIS — R2681 Unsteadiness on feet: Secondary | ICD-10-CM | POA: Diagnosis not present

## 2023-12-28 DIAGNOSIS — R4789 Other speech disturbances: Secondary | ICD-10-CM | POA: Diagnosis not present

## 2023-12-28 DIAGNOSIS — R488 Other symbolic dysfunctions: Secondary | ICD-10-CM | POA: Diagnosis not present

## 2023-12-31 DIAGNOSIS — R2681 Unsteadiness on feet: Secondary | ICD-10-CM | POA: Diagnosis not present

## 2023-12-31 DIAGNOSIS — R2689 Other abnormalities of gait and mobility: Secondary | ICD-10-CM | POA: Diagnosis not present

## 2023-12-31 DIAGNOSIS — R488 Other symbolic dysfunctions: Secondary | ICD-10-CM | POA: Diagnosis not present

## 2023-12-31 DIAGNOSIS — R4789 Other speech disturbances: Secondary | ICD-10-CM | POA: Diagnosis not present

## 2024-01-01 DIAGNOSIS — R488 Other symbolic dysfunctions: Secondary | ICD-10-CM | POA: Diagnosis not present

## 2024-01-01 DIAGNOSIS — R2689 Other abnormalities of gait and mobility: Secondary | ICD-10-CM | POA: Diagnosis not present

## 2024-01-01 DIAGNOSIS — R2681 Unsteadiness on feet: Secondary | ICD-10-CM | POA: Diagnosis not present

## 2024-01-01 DIAGNOSIS — R4789 Other speech disturbances: Secondary | ICD-10-CM | POA: Diagnosis not present

## 2024-01-02 DIAGNOSIS — R2681 Unsteadiness on feet: Secondary | ICD-10-CM | POA: Diagnosis not present

## 2024-01-02 DIAGNOSIS — R2689 Other abnormalities of gait and mobility: Secondary | ICD-10-CM | POA: Diagnosis not present

## 2024-01-02 DIAGNOSIS — R4789 Other speech disturbances: Secondary | ICD-10-CM | POA: Diagnosis not present

## 2024-01-02 DIAGNOSIS — R488 Other symbolic dysfunctions: Secondary | ICD-10-CM | POA: Diagnosis not present

## 2024-01-04 DIAGNOSIS — R2681 Unsteadiness on feet: Secondary | ICD-10-CM | POA: Diagnosis not present

## 2024-01-04 DIAGNOSIS — R488 Other symbolic dysfunctions: Secondary | ICD-10-CM | POA: Diagnosis not present

## 2024-01-04 DIAGNOSIS — R4789 Other speech disturbances: Secondary | ICD-10-CM | POA: Diagnosis not present

## 2024-01-04 DIAGNOSIS — R2689 Other abnormalities of gait and mobility: Secondary | ICD-10-CM | POA: Diagnosis not present

## 2024-01-07 DIAGNOSIS — R488 Other symbolic dysfunctions: Secondary | ICD-10-CM | POA: Diagnosis not present

## 2024-01-07 DIAGNOSIS — R2681 Unsteadiness on feet: Secondary | ICD-10-CM | POA: Diagnosis not present

## 2024-01-07 DIAGNOSIS — R2689 Other abnormalities of gait and mobility: Secondary | ICD-10-CM | POA: Diagnosis not present

## 2024-01-07 DIAGNOSIS — R4789 Other speech disturbances: Secondary | ICD-10-CM | POA: Diagnosis not present

## 2024-01-08 DIAGNOSIS — R4789 Other speech disturbances: Secondary | ICD-10-CM | POA: Diagnosis not present

## 2024-01-08 DIAGNOSIS — R488 Other symbolic dysfunctions: Secondary | ICD-10-CM | POA: Diagnosis not present

## 2024-01-08 DIAGNOSIS — R2689 Other abnormalities of gait and mobility: Secondary | ICD-10-CM | POA: Diagnosis not present

## 2024-01-08 DIAGNOSIS — R2681 Unsteadiness on feet: Secondary | ICD-10-CM | POA: Diagnosis not present

## 2024-01-09 DIAGNOSIS — R2681 Unsteadiness on feet: Secondary | ICD-10-CM | POA: Diagnosis not present

## 2024-01-09 DIAGNOSIS — R4789 Other speech disturbances: Secondary | ICD-10-CM | POA: Diagnosis not present

## 2024-01-09 DIAGNOSIS — R2689 Other abnormalities of gait and mobility: Secondary | ICD-10-CM | POA: Diagnosis not present

## 2024-01-09 DIAGNOSIS — R488 Other symbolic dysfunctions: Secondary | ICD-10-CM | POA: Diagnosis not present

## 2024-01-11 DIAGNOSIS — R2689 Other abnormalities of gait and mobility: Secondary | ICD-10-CM | POA: Diagnosis not present

## 2024-01-11 DIAGNOSIS — R2681 Unsteadiness on feet: Secondary | ICD-10-CM | POA: Diagnosis not present

## 2024-01-11 DIAGNOSIS — R4789 Other speech disturbances: Secondary | ICD-10-CM | POA: Diagnosis not present

## 2024-01-11 DIAGNOSIS — R488 Other symbolic dysfunctions: Secondary | ICD-10-CM | POA: Diagnosis not present

## 2024-01-14 ENCOUNTER — Ambulatory Visit (INDEPENDENT_AMBULATORY_CARE_PROVIDER_SITE_OTHER): Payer: Medicare Other | Admitting: Family Medicine

## 2024-01-14 ENCOUNTER — Encounter: Payer: Self-pay | Admitting: Family Medicine

## 2024-01-14 VITALS — BP 100/70 | HR 64 | Temp 98.1°F | Ht 70.0 in | Wt 162.8 lb

## 2024-01-14 DIAGNOSIS — R2689 Other abnormalities of gait and mobility: Secondary | ICD-10-CM

## 2024-01-14 DIAGNOSIS — E785 Hyperlipidemia, unspecified: Secondary | ICD-10-CM | POA: Diagnosis not present

## 2024-01-14 DIAGNOSIS — I700 Atherosclerosis of aorta: Secondary | ICD-10-CM

## 2024-01-14 DIAGNOSIS — R413 Other amnesia: Secondary | ICD-10-CM | POA: Diagnosis not present

## 2024-01-14 NOTE — Addendum Note (Signed)
 Addended by: Shelva Majestic on: 01/14/2024 09:45 PM   Modules accepted: Level of Service

## 2024-01-14 NOTE — Progress Notes (Addendum)
 Phone [phone] In person visit   Subjective:   Allen Robinson is a 89 y.o. year old very pleasant male patient who presents for/with See problem oriented charting Chief Complaint  Patient presents with   6 week f/u   Back Pain    Still c/o back pain    Past Medical History-  Patient Active Problem List   Diagnosis Date Noted   Aortic atherosclerosis (HCC) 05/21/2019    Priority: Medium    Gout 03/14/2017    Priority: Medium    BPH associated with nocturia 03/14/2017    Priority: Medium    Hyperlipidemia 03/14/2017    Priority: Medium    GERD (gastroesophageal reflux disease) 03/14/2017    Priority: Medium    Senile purpura (HCC) 05/03/2020    Priority: Low   Right rib fracture 05/15/2019    Priority: Low   Hyperglycemia 04/28/2019    Priority: Low   Vitamin D deficiency 03/14/2017    Priority: Low   Peripheral polyneuropathy 02/16/2016    Priority: Low   Diplopia 11/19/2015    Priority: Low   Memory loss 11/29/2023    Medications- reviewed and updated Current Outpatient Medications  Medication Sig Dispense Refill   allopurinol (ZYLOPRIM) 100 MG tablet TAKE TWO TABLETS BY MOUTH DAILY TO PREVENT GOUT, NOT FOR ACUTE ATTACKS, MUST TAKE EVERY DAY     colchicine 0.6 MG tablet Take 0.6 mg by mouth as needed.     Multiple Vitamin (MULTIVITAMIN ADULT PO) Take by mouth.     omeprazole (PRILOSEC) 20 MG capsule Take 20 mg by mouth daily as needed.     Also on simvastatin 20 mg prior to this visit daily No current facility-administered medications for this visit.     Objective:  BP 100/70   Pulse 64   Temp 98.1 F (36.7 C)   Ht 5\' 10"  (1.778 m)   Wt 162 lb 12.8 oz (73.8 kg)   SpO2 96%   BMI 23.36 kg/m  Gen: NAD, resting comfortably CV: RRR no murmurs rubs or gallops Lungs: CTAB no crackles, wheeze, rhonchi Ext: no edema Skin: warm, dry Neuro: Walks with walker, seems more tangential with age visit.  Wife very supportive and with him today     Assessment and Plan   # Balance and memory issues-patient was going to seek second opinion from [place] neurology-previously seen by [facility]- [facility] -Continues work with PT at [facility] and uses walker continuously.   -Discussed that [facility] neurology sometimes declines second opinions from [facility] neurology and that a second opinion from the [place] is more likely-he agrees to call to schedule this-will likely need wife support -See hyperlipidemia below but we are also hoping potentially trialing off simvastatin may be helpful  # Agitation in light of memory loss S: We suspected mild anxiety element as well as depression element.  No thoughts of self-harm.  We recommended a trial of Lexapro 5 mg versus trial of buspirone and he wanted to try the Lexapro-he is here for 6-week follow-up today and reports he took it for 2 weeks and had 2 falls in those 2 weeks so stopped the medicine.  Despite being off the medicine he has started to feel better -reports thinking his wife is 2 separate people- morning and afternoon issue A/P: agitation doing better despite no medicine. We will remain off of medicine. - also having some delusional thinking but worried about adding medicine.    -Did not report lightheadedness  today but wonder if had orthostatic symptoms on the Lexapro with blood pressure running lower than normal at this time at 100/70   # Hyperlipidemia/aortic atherosclerosis - incidental finding on prior imaging with ideal LDL goal under 70 S:Medication - Simvastatin 40 mg-takes half Lab Results  Component Value Date   CHOL 160 08/20/2023   HDL 39 08/20/2023   LDLCALC 99 08/20/2023   TRIG 85 08/20/2023   CHOLHDL 3 11/03/2021  A/P: Cholesterol above goal for aortic atherosclerosis normal 70-also discussed simvastatin crosses blood-brain barrier more than rosuvastatin and discussed option of taking rosuvastatin 5 mg daily instead- in further discussion with memory  issues and no history of heart attack or stroke- we opted to simply STOP the simvastatin for now then reconvene next visit and consider alternate like rosuvastatin    #B12 relative deficiency- taking oral B12 at least since November and multivitamin  -rarely uses omeprazole so absorption should not be a major issue Lab Results  Component Value Date   VITAMINB12 385 10/15/2023    # Back pain after recent falls- states feeling slightly better today.   Seems to hurt in right thoracic back off on the right- wife reports has hurt on and off for years. Massage helps some if pushes up against a post -Nonmidline pain and on and off symptoms for years-discussed option of x-ray or further workup but since he is feeling better today he would like to hold off  Recommended follow up: 3 to 61-month follow-up discussed Future Appointments  Date Time Provider Department Center  04/16/2024 10:20 AM Shelva Majestic, MD [facility]    Lab/Order associations:   ICD-10-CM   1. Memory loss  R41.3     2. Hyperlipidemia, unspecified hyperlipidemia type  E78.5     3. Aortic atherosclerosis (HCC)  I70.0     4. Balance disorder  R26.89       No orders of the defined types were placed in this encounter.   Return precautions advised.  Tana Conch, MD

## 2024-01-14 NOTE — Patient Instructions (Addendum)
 [facility] can get [facility] with them with balance and memory concerns  in further discussion with memory issues and no history of heart attack or stroke- we opted to simply STOP the simvastatin for now then reconvene next visit and consider alternate like rosuvastatin    Recommended follow up: Return in about 3 months (around 04/12/2024) for followup or sooner if needed.Schedule b4 you leave. OR up to 6 months if things are stable- happy to see you sooner if needed as well

## 2024-01-15 DIAGNOSIS — R2681 Unsteadiness on feet: Secondary | ICD-10-CM | POA: Diagnosis not present

## 2024-01-15 DIAGNOSIS — R488 Other symbolic dysfunctions: Secondary | ICD-10-CM | POA: Diagnosis not present

## 2024-01-15 DIAGNOSIS — R4789 Other speech disturbances: Secondary | ICD-10-CM | POA: Diagnosis not present

## 2024-01-15 DIAGNOSIS — R2689 Other abnormalities of gait and mobility: Secondary | ICD-10-CM | POA: Diagnosis not present

## 2024-01-16 DIAGNOSIS — R2689 Other abnormalities of gait and mobility: Secondary | ICD-10-CM | POA: Diagnosis not present

## 2024-01-16 DIAGNOSIS — R2681 Unsteadiness on feet: Secondary | ICD-10-CM | POA: Diagnosis not present

## 2024-01-16 DIAGNOSIS — R4789 Other speech disturbances: Secondary | ICD-10-CM | POA: Diagnosis not present

## 2024-01-16 DIAGNOSIS — R488 Other symbolic dysfunctions: Secondary | ICD-10-CM | POA: Diagnosis not present

## 2024-01-17 DIAGNOSIS — R488 Other symbolic dysfunctions: Secondary | ICD-10-CM | POA: Diagnosis not present

## 2024-01-17 DIAGNOSIS — R2689 Other abnormalities of gait and mobility: Secondary | ICD-10-CM | POA: Diagnosis not present

## 2024-01-17 DIAGNOSIS — R4789 Other speech disturbances: Secondary | ICD-10-CM | POA: Diagnosis not present

## 2024-01-17 DIAGNOSIS — R2681 Unsteadiness on feet: Secondary | ICD-10-CM | POA: Diagnosis not present

## 2024-01-21 DIAGNOSIS — R4789 Other speech disturbances: Secondary | ICD-10-CM | POA: Diagnosis not present

## 2024-01-21 DIAGNOSIS — R2689 Other abnormalities of gait and mobility: Secondary | ICD-10-CM | POA: Diagnosis not present

## 2024-01-21 DIAGNOSIS — R2681 Unsteadiness on feet: Secondary | ICD-10-CM | POA: Diagnosis not present

## 2024-01-21 DIAGNOSIS — R488 Other symbolic dysfunctions: Secondary | ICD-10-CM | POA: Diagnosis not present

## 2024-01-22 DIAGNOSIS — R2681 Unsteadiness on feet: Secondary | ICD-10-CM | POA: Diagnosis not present

## 2024-01-22 DIAGNOSIS — R2689 Other abnormalities of gait and mobility: Secondary | ICD-10-CM | POA: Diagnosis not present

## 2024-01-22 DIAGNOSIS — R488 Other symbolic dysfunctions: Secondary | ICD-10-CM | POA: Diagnosis not present

## 2024-01-22 DIAGNOSIS — R4789 Other speech disturbances: Secondary | ICD-10-CM | POA: Diagnosis not present

## 2024-01-24 DIAGNOSIS — B351 Tinea unguium: Secondary | ICD-10-CM | POA: Diagnosis not present

## 2024-01-24 DIAGNOSIS — R2689 Other abnormalities of gait and mobility: Secondary | ICD-10-CM | POA: Diagnosis not present

## 2024-01-24 DIAGNOSIS — R2681 Unsteadiness on feet: Secondary | ICD-10-CM | POA: Diagnosis not present

## 2024-01-24 DIAGNOSIS — M2012 Hallux valgus (acquired), left foot: Secondary | ICD-10-CM | POA: Diagnosis not present

## 2024-01-24 DIAGNOSIS — R4789 Other speech disturbances: Secondary | ICD-10-CM | POA: Diagnosis not present

## 2024-01-24 DIAGNOSIS — R488 Other symbolic dysfunctions: Secondary | ICD-10-CM | POA: Diagnosis not present

## 2024-01-25 DIAGNOSIS — R4789 Other speech disturbances: Secondary | ICD-10-CM | POA: Diagnosis not present

## 2024-01-25 DIAGNOSIS — R2689 Other abnormalities of gait and mobility: Secondary | ICD-10-CM | POA: Diagnosis not present

## 2024-01-25 DIAGNOSIS — R2681 Unsteadiness on feet: Secondary | ICD-10-CM | POA: Diagnosis not present

## 2024-01-25 DIAGNOSIS — R488 Other symbolic dysfunctions: Secondary | ICD-10-CM | POA: Diagnosis not present

## 2024-02-04 DIAGNOSIS — R2681 Unsteadiness on feet: Secondary | ICD-10-CM | POA: Diagnosis not present

## 2024-02-04 DIAGNOSIS — R488 Other symbolic dysfunctions: Secondary | ICD-10-CM | POA: Diagnosis not present

## 2024-02-04 DIAGNOSIS — R4789 Other speech disturbances: Secondary | ICD-10-CM | POA: Diagnosis not present

## 2024-02-04 DIAGNOSIS — R2689 Other abnormalities of gait and mobility: Secondary | ICD-10-CM | POA: Diagnosis not present

## 2024-02-05 DIAGNOSIS — R2681 Unsteadiness on feet: Secondary | ICD-10-CM | POA: Diagnosis not present

## 2024-02-05 DIAGNOSIS — R4789 Other speech disturbances: Secondary | ICD-10-CM | POA: Diagnosis not present

## 2024-02-05 DIAGNOSIS — R488 Other symbolic dysfunctions: Secondary | ICD-10-CM | POA: Diagnosis not present

## 2024-02-05 DIAGNOSIS — R2689 Other abnormalities of gait and mobility: Secondary | ICD-10-CM | POA: Diagnosis not present

## 2024-02-07 DIAGNOSIS — R488 Other symbolic dysfunctions: Secondary | ICD-10-CM | POA: Diagnosis not present

## 2024-02-07 DIAGNOSIS — R2681 Unsteadiness on feet: Secondary | ICD-10-CM | POA: Diagnosis not present

## 2024-02-07 DIAGNOSIS — R2689 Other abnormalities of gait and mobility: Secondary | ICD-10-CM | POA: Diagnosis not present

## 2024-02-07 DIAGNOSIS — R4789 Other speech disturbances: Secondary | ICD-10-CM | POA: Diagnosis not present

## 2024-02-08 DIAGNOSIS — R2681 Unsteadiness on feet: Secondary | ICD-10-CM | POA: Diagnosis not present

## 2024-02-08 DIAGNOSIS — R488 Other symbolic dysfunctions: Secondary | ICD-10-CM | POA: Diagnosis not present

## 2024-02-08 DIAGNOSIS — R2689 Other abnormalities of gait and mobility: Secondary | ICD-10-CM | POA: Diagnosis not present

## 2024-02-08 DIAGNOSIS — R4789 Other speech disturbances: Secondary | ICD-10-CM | POA: Diagnosis not present

## 2024-02-12 DIAGNOSIS — R2681 Unsteadiness on feet: Secondary | ICD-10-CM | POA: Diagnosis not present

## 2024-02-12 DIAGNOSIS — R2689 Other abnormalities of gait and mobility: Secondary | ICD-10-CM | POA: Diagnosis not present

## 2024-02-12 DIAGNOSIS — R4789 Other speech disturbances: Secondary | ICD-10-CM | POA: Diagnosis not present

## 2024-02-12 DIAGNOSIS — R488 Other symbolic dysfunctions: Secondary | ICD-10-CM | POA: Diagnosis not present

## 2024-02-13 ENCOUNTER — Ambulatory Visit (INDEPENDENT_AMBULATORY_CARE_PROVIDER_SITE_OTHER)

## 2024-02-13 VITALS — Ht 70.0 in | Wt 162.0 lb

## 2024-02-13 DIAGNOSIS — R488 Other symbolic dysfunctions: Secondary | ICD-10-CM | POA: Diagnosis not present

## 2024-02-13 DIAGNOSIS — Z0000 Encounter for general adult medical examination without abnormal findings: Secondary | ICD-10-CM

## 2024-02-13 DIAGNOSIS — R2681 Unsteadiness on feet: Secondary | ICD-10-CM | POA: Diagnosis not present

## 2024-02-13 DIAGNOSIS — R4789 Other speech disturbances: Secondary | ICD-10-CM | POA: Diagnosis not present

## 2024-02-13 DIAGNOSIS — R2689 Other abnormalities of gait and mobility: Secondary | ICD-10-CM | POA: Diagnosis not present

## 2024-02-13 NOTE — Progress Notes (Signed)
 Subjective:   Allen Robinson is a 89 y.o. who presents for a Medicare Wellness preventive visit.  Visit Complete: Virtual I connected with  Allen Robinson on 02/13/24 by a audio enabled telemedicine application and verified that I am speaking with the correct person using two identifiers.  Patient Location: Home  Provider Location: Home Office  I discussed the limitations of evaluation and management by telemedicine. The patient expressed understanding and agreed to proceed.  Vital Signs: Because this visit was a virtual/telehealth visit, some criteria may be missing or patient reported. Any vitals not documented were not able to be obtained and vitals that have been documented are patient reported.  VideoDeclined- This patient declined Librarian, academic. Therefore the visit was completed with audio only.  Persons Participating in Visit: Patient.  AWV Questionnaire: No: Patient Medicare AWV questionnaire was not completed prior to this visit.  Cardiac Risk Factors include: advanced age (>73men, >55 women);dyslipidemia;male gender     Objective:    Today's Vitals   02/13/24 1459  Weight: 162 lb (73.5 kg)  Height: 5\' 10"  (1.778 m)   Body mass index is 23.24 kg/m.     02/13/2024    3:06 PM 10/11/2023    9:12 AM 07/11/2023    9:49 AM 05/30/2023    5:09 PM 12/01/2022   10:08 AM 12/11/2021    3:56 PM 11/25/2021   10:24 AM  Advanced Directives  Does Patient Have a Medical Advance Directive? Yes Yes Yes Yes Yes Yes Yes  Type of Estate agent of [place];Living will Healthcare Power of [employer] Power of [employer] Power of [place];Living will Healthcare Power of [employer] Power of Attorney  Does patient want to make changes to medical advance directive?  No - Patient declined No - Patient declined   No - Patient declined   Copy of Healthcare Power of Attorney in Chart? No - copy requested No  - copy requested No - copy requested  No - copy requested No - copy requested No - copy requested    Current Medications (verified) Outpatient Encounter Medications as of 02/13/2024  Medication Sig   allopurinol (ZYLOPRIM) 100 MG tablet TAKE TWO TABLETS BY MOUTH DAILY TO PREVENT GOUT, NOT FOR ACUTE ATTACKS, MUST TAKE EVERY DAY   colchicine 0.6 MG tablet Take 0.6 mg by mouth as needed.   Cyanocobalamin (VITAMIN B 12 PO) Take by mouth.   Multiple Vitamin (MULTIVITAMIN ADULT PO) Take by mouth.   omeprazole (PRILOSEC) 20 MG capsule Take 20 mg by mouth daily as needed.    No facility-administered encounter medications on file as of 02/13/2024.    Allergies (verified) Patient has no known allergies.   History: Past Medical History:  Diagnosis Date   Arthritis    hands   Chicken pox    Hyperlipidemia    Pneumonia    Seasonal allergies    Past Surgical History:  Procedure Laterality Date   TONSILLECTOMY     32   Family History  Problem Relation Age of Onset   Heart failure Mother    Pneumonia Mother        died 5   Stroke Father        18   Neuropathy Neg Hx    Social History   Socioeconomic History   Marital status: Married    Spouse name: [name]   Number of children: 2   Years of education: 16   Highest education level: Not  on file  Occupational History   Not on file  Tobacco Use   Smoking status: Never   Smokeless tobacco: Never  Substance and Sexual Activity   Alcohol use: Yes    Alcohol/week: 0.0 standard drinks of alcohol    Comment: no longer drinking. prior social   Drug use: No   Sexual activity: Not on file  Other Topics Concern   Not on file  Social History Narrative   Married. Lives with wife. 2 children- daughter Ecologist (2 daughters one will be a Land), daughter Wylene Men and never came back home- still at [employer] (1 grandson) so 3 grandkids total.        Fought in [place] war- wife was also in the war and veteran.       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    Right handed      Lives with [facility] Lucretia Roers   Social Drivers of Health   Financial Resource Strain: Low Risk  (02/13/2024)   Overall Financial Resource Strain (CARDIA)    Difficulty of Paying Living Expenses: Not hard at all  Food Insecurity: No Food Insecurity (02/13/2024)   Hunger Vital Sign    Worried About Running Out of Food in the Last Year: Never true    Ran Out of Food in the Last Year: Never true  Transportation Needs: No Transportation Needs (02/13/2024)   PRAPARE - Administrator, Civil Service (Medical): No    Lack of Transportation (Non-Medical): No  Physical Activity: Insufficiently Active (02/13/2024)   Exercise Vital Sign    Days of Exercise per Week: 3 days    Minutes of Exercise per Session: 30 min  Stress: No Stress Concern Present (02/13/2024)   [employer] of Occupational Health - Occupational Stress Questionnaire    Feeling of Stress : Not at all  Social Connections: Socially Integrated (02/13/2024)   Social Connection and Isolation Panel [NHANES]    Frequency of Communication with Friends and Family: Three times a week    Frequency of Social Gatherings with Friends and Family: Three times a week    Attends Religious Services: 1 to 4 times per year    Active Member of Clubs or Organizations: Yes    Attends Banker Meetings: 1 to 4 times per year    Marital Status: Married    Tobacco Counseling Counseling given: Not Answered    Clinical Intake:  Pre-visit preparation completed: Yes  Pain : No/denies pain     BMI - recorded: 23.24 Nutritional Status: BMI of 19-24  Normal Nutritional Risks: None Diabetes: No  Lab Results  Component Value Date   HGBA1C 6.1 08/20/2023   HGBA1C 6.2 02/20/2023   HGBA1C 6.0 02/14/2022     How often do you need to have someone help you when you read instructions, pamphlets, or other written materials from your doctor or pharmacy?: 1 -  Never  Interpreter Needed?: No  Information entered by :: Lanier Ensign, LPN   Activities of Daily Living     02/13/2024    3:03 PM  In your present state of health, do you have any difficulty performing the following activities:  Hearing? 1  Comment hearing aids  Vision? 0  Difficulty concentrating or making decisions? 0  Walking or climbing stairs? 1  Dressing or bathing? 0  Doing errands, shopping? 0  Preparing Food and eating ? N  Using the Toilet? N  In the past six  months, have you accidently leaked urine? N  Do you have problems with loss of bowel control? N  Managing your Medications? Y  Comment wife  Managing your Finances? N  Housekeeping or managing your Housekeeping? N    Patient Care Team: Shelva Majestic, MD as PCP - General (Family Medicine) Clinic, Lenn Sink as Consulting Physician Donzetta Starch, MD as Consulting Physician (Dermatology) Erroll Luna, [facility] (Inactive) (Pharmacist)  Indicate any recent Medical Services you may have received from other than [facility] providers in the past year (date may be approximate).     Assessment:   This is a routine wellness examination for [place].  Hearing/Vision screen Hearing Screening - Comments:: Pt has hearing aids  Vision Screening - Comments:: Pt follows up with Dr Emily Filbert for annual eye exams, Wears rx glasses - up to date with routine eye exams with     Goals Addressed             This Visit's Progress    Patient Stated       Maintain health and activity       Depression Screen     02/13/2024    3:07 PM 01/14/2024   10:31 AM 12/01/2022   10:07 AM 11/25/2021   10:18 AM 04/27/2021    9:49 AM 11/05/2020    9:39 AM 11/02/2020    9:47 AM  PHQ 2/9 Scores  PHQ - 2 Score 0 0 1 0 0 0 0  PHQ- 9 Score 0 0         Fall Risk     02/13/2024    3:08 PM 10/11/2023    9:11 AM 07/11/2023    9:49 AM 06/14/2023    9:26 AM 12/01/2022   10:09 AM  Fall Risk   Falls in the past year? 1 1 1 1 1   Number  falls in past yr: 1 1 1  0 1  Injury with Fall? 1 1 0 1 1  Comment broke nose    back and knee injury  Risk for fall due to : Impaired balance/gait;Impaired mobility;History of fall(s)   History of fall(s) Impaired vision;Impaired balance/gait;History of fall(s)  Follow up Falls prevention discussed Falls evaluation completed Falls evaluation completed Falls evaluation completed Falls prevention discussed    MEDICARE RISK AT HOME:  Medicare Risk at Home Any stairs in or around the home?: No If so, are there any without handrails?: No Home free of loose throw rugs in walkways, pet beds, electrical cords, etc?: Yes Adequate lighting in your home to reduce risk of falls?: Yes Life alert?: Yes Use of a cane, walker or w/c?: Yes Grab bars in the bathroom?: Yes Shower chair or bench in shower?: Yes Elevated toilet seat or a handicapped toilet?: Yes  TIMED UP AND GO:  Was the test performed?  No  Cognitive Function: 6CIT completed    07/11/2023    7:00 PM 04/18/2018    8:51 AM 03/14/2017    2:46 PM  MMSE - Mini Mental State Exam  Not completed:  -- --  Orientation to time 3    Orientation to Place 3    Registration 3    Attention/ Calculation 4    Recall 0    Language- name 2 objects 2    Language- repeat 1    Language- follow 3 step command 3    Language- read & follow direction 1    Write a sentence 1    Copy design 1  Total score 22          02/13/2024    3:10 PM 12/01/2022   10:11 AM 11/25/2021   10:30 AM 11/05/2020    9:48 AM  6CIT Screen  What Year? 0 points 0 points 0 points 0 points  What month? 0 points 0 points 0 points 0 points  What time? 0 points 0 points 0 points   Count back from 20 0 points 0 points 0 points 0 points  Months in reverse 0 points 2 points 0 points 0 points  Repeat phrase 0 points 2 points 0 points 0 points  Total Score 0 points 4 points 0 points     Immunizations Immunization History  Administered Date(s) Administered   Fluad Quad(high  Dose 65+) 09/01/2020, 09/20/2020   H1N1 10/20/2008   Influenza, High Dose Seasonal PF 08/20/2010, 08/20/2013, 08/21/2015, 08/31/2016, 08/20/2017, 09/04/2017, 08/20/2018, 09/12/2018, 09/03/2022   Influenza,inj,Quad PF,6+ Mos 09/08/2016   Influenza-Unspecified 08/20/2001, 08/21/2003, 09/13/2004, 08/20/2005, 08/20/2006, 08/26/2007, 08/20/2008, 08/20/2009, 08/20/2010, 08/21/2011, 08/25/2014, 08/01/2019, 08/24/2021, 08/29/2022   Moderna Covid-19 Fall Seasonal Vaccine 65yrs & older 08/20/2023   Moderna Covid-19 Vaccine Bivalent Booster 27yrs & up 08/24/2021   Moderna Sars-Covid-2 Vaccination 12/11/2019, 01/15/2020, 09/20/2020, 01/11/2022   Novel Infuenza-h1n1-09 11/05/2008   PFIZER Comirnaty(Gray Top)Covid-19 Tri-Sucrose Vaccine 04/11/2021   Pfizer(Comirnaty)Fall Seasonal Vaccine 12 years and older 08/31/2022   Pneumococcal Conjugate-13 01/23/2014   Pneumococcal-Unspecified 08/20/2000   Rsv, Bivalent, Protein Subunit Rsvpref,pf (Abrysvo) 02/20/2023   Td 04/30/2017   Tdap 11/21/2010, 01/27/2021   Zoster, Live 01/19/2015    Screening Tests Health Maintenance  Topic Date Due   Zoster Vaccines- Shingrix (1 of 2) 02/17/1978   COVID-19 Vaccine (9 - 2024-25 season) 02/17/2024   Pneumonia Vaccine 51+ Years old (2 of 2 - PPSV23 or PCV20) 06/13/2024 (Originally 01/24/2015)   Medicare Annual Wellness (AWV)  02/12/2025   DTaP/Tdap/Td (4 - Td or Tdap) 01/28/2031   INFLUENZA VACCINE  Completed   HPV VACCINES  Aged Out    Health Maintenance  Health Maintenance Due  Topic Date Due   Zoster Vaccines- Shingrix (1 of 2) 02/17/1978   COVID-19 Vaccine (9 - 2024-25 season) 02/17/2024   Health Maintenance Items Addressed: See Nurse Notes  Additional Screening:  Vision Screening: Recommended annual ophthalmology exams for early detection of glaucoma and other disorders of the eye.  Dental Screening: Recommended annual dental exams for proper oral hygiene  Community Resource Referral / Chronic Care  Management: CRR required this visit?  No   CCM required this visit?  No     Plan:     I have personally reviewed and noted the following in the patient's chart:   Medical and social history Use of alcohol, tobacco or illicit drugs  Current medications and supplements including opioid prescriptions. Patient is not currently taking opioid prescriptions. Functional ability and status Nutritional status Physical activity Advanced directives List of other physicians Hospitalizations, surgeries, and ER visits in previous 12 months Vitals Screenings to include cognitive, depression, and falls Referrals and appointments  In addition, I have reviewed and discussed with patient certain preventive protocols, quality metrics, and best practice recommendations. A written personalized care plan for preventive services as well as general preventive health recommendations were provided to patient.     Marzella Schlein, LPN   07/18/5620   After Visit Summary: (MyChart) Due to this being a telephonic visit, the after visit summary with patients personalized plan was offered to patient via MyChart   Notes: Nothing significant to report  at this time.

## 2024-02-13 NOTE — Patient Instructions (Signed)
 Allen Robinson , Thank you for taking time to come for your Medicare Wellness Visit. I appreciate your ongoing commitment to your health goals. Please review the following plan we discussed and let me know if I can assist you in the future.   Referrals/Orders/Follow-Ups/Clinician Recommendations: Each day, aim for 6 glasses of water, plenty of protein in your diet and try to get up and walk/ stretch every hour for 5-10 minutes at a time.  Maintain health and activity   This is a list of the screening recommended for you and due dates:  Health Maintenance  Topic Date Due   Zoster (Shingles) Vaccine (1 of 2) 02/17/1978   COVID-19 Vaccine (9 - 2024-25 season) 02/17/2024   Pneumonia Vaccine (2 of 2 - PPSV23 or PCV20) 06/13/2024*   Medicare Annual Wellness Visit  02/12/2025   DTaP/Tdap/Td vaccine (4 - Td or Tdap) 01/28/2031   Flu Shot  Completed   HPV Vaccine  Aged Out  *Topic was postponed. The date shown is not the original due date.    Advanced directives: (Copy Requested) Please bring a copy of your health care power of attorney and living will to the office to be added to your chart at your convenience. You can mail to [facility] [address] [place], [place] [address] or email to [email or web address]  Next Medicare Annual Wellness Visit scheduled for next year: Yes

## 2024-02-14 DIAGNOSIS — R4789 Other speech disturbances: Secondary | ICD-10-CM | POA: Diagnosis not present

## 2024-02-14 DIAGNOSIS — R2689 Other abnormalities of gait and mobility: Secondary | ICD-10-CM | POA: Diagnosis not present

## 2024-02-14 DIAGNOSIS — R488 Other symbolic dysfunctions: Secondary | ICD-10-CM | POA: Diagnosis not present

## 2024-02-14 DIAGNOSIS — R2681 Unsteadiness on feet: Secondary | ICD-10-CM | POA: Diagnosis not present

## 2024-02-15 DIAGNOSIS — R4789 Other speech disturbances: Secondary | ICD-10-CM | POA: Diagnosis not present

## 2024-02-15 DIAGNOSIS — R488 Other symbolic dysfunctions: Secondary | ICD-10-CM | POA: Diagnosis not present

## 2024-02-15 DIAGNOSIS — R2689 Other abnormalities of gait and mobility: Secondary | ICD-10-CM | POA: Diagnosis not present

## 2024-02-15 DIAGNOSIS — R2681 Unsteadiness on feet: Secondary | ICD-10-CM | POA: Diagnosis not present

## 2024-02-20 DIAGNOSIS — R2689 Other abnormalities of gait and mobility: Secondary | ICD-10-CM | POA: Diagnosis not present

## 2024-02-20 DIAGNOSIS — R2681 Unsteadiness on feet: Secondary | ICD-10-CM | POA: Diagnosis not present

## 2024-02-20 DIAGNOSIS — R488 Other symbolic dysfunctions: Secondary | ICD-10-CM | POA: Diagnosis not present

## 2024-02-20 DIAGNOSIS — R4789 Other speech disturbances: Secondary | ICD-10-CM | POA: Diagnosis not present

## 2024-02-22 DIAGNOSIS — R488 Other symbolic dysfunctions: Secondary | ICD-10-CM | POA: Diagnosis not present

## 2024-02-22 DIAGNOSIS — R4789 Other speech disturbances: Secondary | ICD-10-CM | POA: Diagnosis not present

## 2024-02-22 DIAGNOSIS — R2689 Other abnormalities of gait and mobility: Secondary | ICD-10-CM | POA: Diagnosis not present

## 2024-02-22 DIAGNOSIS — R2681 Unsteadiness on feet: Secondary | ICD-10-CM | POA: Diagnosis not present

## 2024-02-25 DIAGNOSIS — R488 Other symbolic dysfunctions: Secondary | ICD-10-CM | POA: Diagnosis not present

## 2024-02-25 DIAGNOSIS — R4789 Other speech disturbances: Secondary | ICD-10-CM | POA: Diagnosis not present

## 2024-02-25 DIAGNOSIS — R2681 Unsteadiness on feet: Secondary | ICD-10-CM | POA: Diagnosis not present

## 2024-02-25 DIAGNOSIS — R2689 Other abnormalities of gait and mobility: Secondary | ICD-10-CM | POA: Diagnosis not present

## 2024-02-26 DIAGNOSIS — R488 Other symbolic dysfunctions: Secondary | ICD-10-CM | POA: Diagnosis not present

## 2024-02-26 DIAGNOSIS — R2681 Unsteadiness on feet: Secondary | ICD-10-CM | POA: Diagnosis not present

## 2024-02-26 DIAGNOSIS — R2689 Other abnormalities of gait and mobility: Secondary | ICD-10-CM | POA: Diagnosis not present

## 2024-02-26 DIAGNOSIS — R4789 Other speech disturbances: Secondary | ICD-10-CM | POA: Diagnosis not present

## 2024-02-28 DIAGNOSIS — R488 Other symbolic dysfunctions: Secondary | ICD-10-CM | POA: Diagnosis not present

## 2024-02-28 DIAGNOSIS — R2689 Other abnormalities of gait and mobility: Secondary | ICD-10-CM | POA: Diagnosis not present

## 2024-02-28 DIAGNOSIS — R2681 Unsteadiness on feet: Secondary | ICD-10-CM | POA: Diagnosis not present

## 2024-02-28 DIAGNOSIS — R4789 Other speech disturbances: Secondary | ICD-10-CM | POA: Diagnosis not present

## 2024-02-29 DIAGNOSIS — R2681 Unsteadiness on feet: Secondary | ICD-10-CM | POA: Diagnosis not present

## 2024-02-29 DIAGNOSIS — R488 Other symbolic dysfunctions: Secondary | ICD-10-CM | POA: Diagnosis not present

## 2024-02-29 DIAGNOSIS — R2689 Other abnormalities of gait and mobility: Secondary | ICD-10-CM | POA: Diagnosis not present

## 2024-02-29 DIAGNOSIS — R4789 Other speech disturbances: Secondary | ICD-10-CM | POA: Diagnosis not present

## 2024-03-03 DIAGNOSIS — R2689 Other abnormalities of gait and mobility: Secondary | ICD-10-CM | POA: Diagnosis not present

## 2024-03-03 DIAGNOSIS — R488 Other symbolic dysfunctions: Secondary | ICD-10-CM | POA: Diagnosis not present

## 2024-03-03 DIAGNOSIS — R4789 Other speech disturbances: Secondary | ICD-10-CM | POA: Diagnosis not present

## 2024-03-03 DIAGNOSIS — R2681 Unsteadiness on feet: Secondary | ICD-10-CM | POA: Diagnosis not present

## 2024-03-04 DIAGNOSIS — R488 Other symbolic dysfunctions: Secondary | ICD-10-CM | POA: Diagnosis not present

## 2024-03-04 DIAGNOSIS — R2681 Unsteadiness on feet: Secondary | ICD-10-CM | POA: Diagnosis not present

## 2024-03-04 DIAGNOSIS — R4789 Other speech disturbances: Secondary | ICD-10-CM | POA: Diagnosis not present

## 2024-03-04 DIAGNOSIS — R2689 Other abnormalities of gait and mobility: Secondary | ICD-10-CM | POA: Diagnosis not present

## 2024-03-05 DIAGNOSIS — R2689 Other abnormalities of gait and mobility: Secondary | ICD-10-CM | POA: Diagnosis not present

## 2024-03-05 DIAGNOSIS — R4789 Other speech disturbances: Secondary | ICD-10-CM | POA: Diagnosis not present

## 2024-03-05 DIAGNOSIS — R488 Other symbolic dysfunctions: Secondary | ICD-10-CM | POA: Diagnosis not present

## 2024-03-05 DIAGNOSIS — R2681 Unsteadiness on feet: Secondary | ICD-10-CM | POA: Diagnosis not present

## 2024-03-06 DIAGNOSIS — R488 Other symbolic dysfunctions: Secondary | ICD-10-CM | POA: Diagnosis not present

## 2024-03-06 DIAGNOSIS — R2689 Other abnormalities of gait and mobility: Secondary | ICD-10-CM | POA: Diagnosis not present

## 2024-03-06 DIAGNOSIS — R2681 Unsteadiness on feet: Secondary | ICD-10-CM | POA: Diagnosis not present

## 2024-03-06 DIAGNOSIS — R4789 Other speech disturbances: Secondary | ICD-10-CM | POA: Diagnosis not present

## 2024-03-07 DIAGNOSIS — R488 Other symbolic dysfunctions: Secondary | ICD-10-CM | POA: Diagnosis not present

## 2024-03-07 DIAGNOSIS — R4789 Other speech disturbances: Secondary | ICD-10-CM | POA: Diagnosis not present

## 2024-03-07 DIAGNOSIS — R2689 Other abnormalities of gait and mobility: Secondary | ICD-10-CM | POA: Diagnosis not present

## 2024-03-07 DIAGNOSIS — R2681 Unsteadiness on feet: Secondary | ICD-10-CM | POA: Diagnosis not present

## 2024-03-10 DIAGNOSIS — R4789 Other speech disturbances: Secondary | ICD-10-CM | POA: Diagnosis not present

## 2024-03-10 DIAGNOSIS — R488 Other symbolic dysfunctions: Secondary | ICD-10-CM | POA: Diagnosis not present

## 2024-03-10 DIAGNOSIS — R2681 Unsteadiness on feet: Secondary | ICD-10-CM | POA: Diagnosis not present

## 2024-03-10 DIAGNOSIS — R2689 Other abnormalities of gait and mobility: Secondary | ICD-10-CM | POA: Diagnosis not present

## 2024-03-13 DIAGNOSIS — R2681 Unsteadiness on feet: Secondary | ICD-10-CM | POA: Diagnosis not present

## 2024-03-13 DIAGNOSIS — R4789 Other speech disturbances: Secondary | ICD-10-CM | POA: Diagnosis not present

## 2024-03-13 DIAGNOSIS — R488 Other symbolic dysfunctions: Secondary | ICD-10-CM | POA: Diagnosis not present

## 2024-03-13 DIAGNOSIS — R2689 Other abnormalities of gait and mobility: Secondary | ICD-10-CM | POA: Diagnosis not present

## 2024-03-14 DIAGNOSIS — R488 Other symbolic dysfunctions: Secondary | ICD-10-CM | POA: Diagnosis not present

## 2024-03-14 DIAGNOSIS — R4789 Other speech disturbances: Secondary | ICD-10-CM | POA: Diagnosis not present

## 2024-03-14 DIAGNOSIS — R2689 Other abnormalities of gait and mobility: Secondary | ICD-10-CM | POA: Diagnosis not present

## 2024-03-14 DIAGNOSIS — R2681 Unsteadiness on feet: Secondary | ICD-10-CM | POA: Diagnosis not present

## 2024-03-17 DIAGNOSIS — R488 Other symbolic dysfunctions: Secondary | ICD-10-CM | POA: Diagnosis not present

## 2024-03-17 DIAGNOSIS — R2681 Unsteadiness on feet: Secondary | ICD-10-CM | POA: Diagnosis not present

## 2024-03-17 DIAGNOSIS — R4789 Other speech disturbances: Secondary | ICD-10-CM | POA: Diagnosis not present

## 2024-03-17 DIAGNOSIS — R2689 Other abnormalities of gait and mobility: Secondary | ICD-10-CM | POA: Diagnosis not present

## 2024-03-18 DIAGNOSIS — R2689 Other abnormalities of gait and mobility: Secondary | ICD-10-CM | POA: Diagnosis not present

## 2024-03-18 DIAGNOSIS — R488 Other symbolic dysfunctions: Secondary | ICD-10-CM | POA: Diagnosis not present

## 2024-03-18 DIAGNOSIS — R2681 Unsteadiness on feet: Secondary | ICD-10-CM | POA: Diagnosis not present

## 2024-03-18 DIAGNOSIS — R4789 Other speech disturbances: Secondary | ICD-10-CM | POA: Diagnosis not present

## 2024-03-19 DIAGNOSIS — R2689 Other abnormalities of gait and mobility: Secondary | ICD-10-CM | POA: Diagnosis not present

## 2024-03-19 DIAGNOSIS — R4789 Other speech disturbances: Secondary | ICD-10-CM | POA: Diagnosis not present

## 2024-03-19 DIAGNOSIS — R488 Other symbolic dysfunctions: Secondary | ICD-10-CM | POA: Diagnosis not present

## 2024-03-19 DIAGNOSIS — R2681 Unsteadiness on feet: Secondary | ICD-10-CM | POA: Diagnosis not present

## 2024-03-20 DIAGNOSIS — R2681 Unsteadiness on feet: Secondary | ICD-10-CM | POA: Diagnosis not present

## 2024-03-20 DIAGNOSIS — R488 Other symbolic dysfunctions: Secondary | ICD-10-CM | POA: Diagnosis not present

## 2024-03-20 DIAGNOSIS — R4789 Other speech disturbances: Secondary | ICD-10-CM | POA: Diagnosis not present

## 2024-03-20 DIAGNOSIS — R2689 Other abnormalities of gait and mobility: Secondary | ICD-10-CM | POA: Diagnosis not present

## 2024-03-25 DIAGNOSIS — R488 Other symbolic dysfunctions: Secondary | ICD-10-CM | POA: Diagnosis not present

## 2024-03-25 DIAGNOSIS — R2689 Other abnormalities of gait and mobility: Secondary | ICD-10-CM | POA: Diagnosis not present

## 2024-03-25 DIAGNOSIS — R4789 Other speech disturbances: Secondary | ICD-10-CM | POA: Diagnosis not present

## 2024-03-25 DIAGNOSIS — R2681 Unsteadiness on feet: Secondary | ICD-10-CM | POA: Diagnosis not present

## 2024-03-26 DIAGNOSIS — R2681 Unsteadiness on feet: Secondary | ICD-10-CM | POA: Diagnosis not present

## 2024-03-26 DIAGNOSIS — R488 Other symbolic dysfunctions: Secondary | ICD-10-CM | POA: Diagnosis not present

## 2024-03-26 DIAGNOSIS — R2689 Other abnormalities of gait and mobility: Secondary | ICD-10-CM | POA: Diagnosis not present

## 2024-03-26 DIAGNOSIS — R4789 Other speech disturbances: Secondary | ICD-10-CM | POA: Diagnosis not present

## 2024-03-27 DIAGNOSIS — R488 Other symbolic dysfunctions: Secondary | ICD-10-CM | POA: Diagnosis not present

## 2024-03-27 DIAGNOSIS — R2681 Unsteadiness on feet: Secondary | ICD-10-CM | POA: Diagnosis not present

## 2024-03-27 DIAGNOSIS — R4789 Other speech disturbances: Secondary | ICD-10-CM | POA: Diagnosis not present

## 2024-03-27 DIAGNOSIS — R2689 Other abnormalities of gait and mobility: Secondary | ICD-10-CM | POA: Diagnosis not present

## 2024-04-01 DIAGNOSIS — R4789 Other speech disturbances: Secondary | ICD-10-CM | POA: Diagnosis not present

## 2024-04-01 DIAGNOSIS — R2689 Other abnormalities of gait and mobility: Secondary | ICD-10-CM | POA: Diagnosis not present

## 2024-04-01 DIAGNOSIS — R488 Other symbolic dysfunctions: Secondary | ICD-10-CM | POA: Diagnosis not present

## 2024-04-01 DIAGNOSIS — R2681 Unsteadiness on feet: Secondary | ICD-10-CM | POA: Diagnosis not present

## 2024-04-03 DIAGNOSIS — R4789 Other speech disturbances: Secondary | ICD-10-CM | POA: Diagnosis not present

## 2024-04-03 DIAGNOSIS — R488 Other symbolic dysfunctions: Secondary | ICD-10-CM | POA: Diagnosis not present

## 2024-04-03 DIAGNOSIS — R2681 Unsteadiness on feet: Secondary | ICD-10-CM | POA: Diagnosis not present

## 2024-04-03 DIAGNOSIS — R2689 Other abnormalities of gait and mobility: Secondary | ICD-10-CM | POA: Diagnosis not present

## 2024-04-07 LAB — LIPID PANEL
Cholesterol: 224 — AB (ref 0–200)
HDL: 41 (ref 35–70)
LDL Cholesterol: 170
Triglycerides: 134 (ref 40–160)

## 2024-04-07 LAB — BASIC METABOLIC PANEL WITH GFR
BUN: 24 — AB (ref 4–21)
CO2: 26 — AB (ref 13–22)
Chloride: 110 — AB (ref 99–108)
Creatinine: 1.2 (ref 0.6–1.3)
Glucose: 92
Potassium: 4.5 meq/L (ref 3.5–5.1)
Sodium: 144 (ref 137–147)

## 2024-04-07 LAB — HEPATIC FUNCTION PANEL
ALT: 15 U/L (ref 10–40)
AST: 25 (ref 14–40)

## 2024-04-07 LAB — COMPREHENSIVE METABOLIC PANEL WITH GFR
Calcium: 9.4 (ref 8.7–10.7)
eGFR: 57

## 2024-04-07 LAB — HEMOGLOBIN A1C: Hemoglobin A1C: 6

## 2024-04-08 DIAGNOSIS — R488 Other symbolic dysfunctions: Secondary | ICD-10-CM | POA: Diagnosis not present

## 2024-04-08 DIAGNOSIS — R2689 Other abnormalities of gait and mobility: Secondary | ICD-10-CM | POA: Diagnosis not present

## 2024-04-08 DIAGNOSIS — R4789 Other speech disturbances: Secondary | ICD-10-CM | POA: Diagnosis not present

## 2024-04-08 DIAGNOSIS — R2681 Unsteadiness on feet: Secondary | ICD-10-CM | POA: Diagnosis not present

## 2024-04-10 DIAGNOSIS — R4789 Other speech disturbances: Secondary | ICD-10-CM | POA: Diagnosis not present

## 2024-04-10 DIAGNOSIS — R2689 Other abnormalities of gait and mobility: Secondary | ICD-10-CM | POA: Diagnosis not present

## 2024-04-10 DIAGNOSIS — R2681 Unsteadiness on feet: Secondary | ICD-10-CM | POA: Diagnosis not present

## 2024-04-10 DIAGNOSIS — R488 Other symbolic dysfunctions: Secondary | ICD-10-CM | POA: Diagnosis not present

## 2024-04-14 DIAGNOSIS — R2681 Unsteadiness on feet: Secondary | ICD-10-CM | POA: Diagnosis not present

## 2024-04-14 DIAGNOSIS — R2689 Other abnormalities of gait and mobility: Secondary | ICD-10-CM | POA: Diagnosis not present

## 2024-04-14 DIAGNOSIS — R4789 Other speech disturbances: Secondary | ICD-10-CM | POA: Diagnosis not present

## 2024-04-14 DIAGNOSIS — R488 Other symbolic dysfunctions: Secondary | ICD-10-CM | POA: Diagnosis not present

## 2024-04-15 DIAGNOSIS — R4789 Other speech disturbances: Secondary | ICD-10-CM | POA: Diagnosis not present

## 2024-04-15 DIAGNOSIS — R488 Other symbolic dysfunctions: Secondary | ICD-10-CM | POA: Diagnosis not present

## 2024-04-15 DIAGNOSIS — R2681 Unsteadiness on feet: Secondary | ICD-10-CM | POA: Diagnosis not present

## 2024-04-15 DIAGNOSIS — R2689 Other abnormalities of gait and mobility: Secondary | ICD-10-CM | POA: Diagnosis not present

## 2024-04-16 ENCOUNTER — Encounter: Payer: Self-pay | Admitting: Family Medicine

## 2024-04-16 ENCOUNTER — Ambulatory Visit (INDEPENDENT_AMBULATORY_CARE_PROVIDER_SITE_OTHER): Payer: Medicare Other | Admitting: Family Medicine

## 2024-04-16 VITALS — BP 100/70 | HR 65 | Temp 97.7°F | Ht 70.0 in | Wt 164.6 lb

## 2024-04-16 DIAGNOSIS — R413 Other amnesia: Secondary | ICD-10-CM

## 2024-04-16 DIAGNOSIS — I700 Atherosclerosis of aorta: Secondary | ICD-10-CM | POA: Diagnosis not present

## 2024-04-16 DIAGNOSIS — Z66 Do not resuscitate: Secondary | ICD-10-CM | POA: Diagnosis not present

## 2024-04-16 DIAGNOSIS — R2689 Other abnormalities of gait and mobility: Secondary | ICD-10-CM

## 2024-04-16 DIAGNOSIS — R7303 Prediabetes: Secondary | ICD-10-CM

## 2024-04-16 DIAGNOSIS — E785 Hyperlipidemia, unspecified: Secondary | ICD-10-CM | POA: Diagnosis not present

## 2024-04-16 DIAGNOSIS — M1A0790 Idiopathic chronic gout, unspecified ankle and foot, without tophus (tophi): Secondary | ICD-10-CM

## 2024-04-16 MED ORDER — ROSUVASTATIN CALCIUM 5 MG PO TABS
5.0000 mg | ORAL_TABLET | Freq: Every day | ORAL | 3 refills | Status: AC
Start: 2024-04-16 — End: ?

## 2024-04-16 NOTE — Patient Instructions (Addendum)
 From February visit "Cholesterol above goal for aortic atherosclerosis normal 70-also discussed simvastatin  crosses blood-brain barrier more than rosuvastatin  and discussed option of taking rosuvastatin  5 mg daily instead- in further discussion with memory issues and no history of heart attack or stroke- we opted to simply STOP the simvastatin  for now then reconvene next visit and consider alternate like rosuvastatin   " -today we opted to restart low statin at low dose rosuvastatin  5 mg daily and see how he does with this- can recheck labs next visit as LDL did elevate substantially off of medicine  Recommended follow up: Return in about 6 months (around 10/17/2024) for followup or sooner if needed.Schedule b4 you leave.

## 2024-04-16 NOTE — Progress Notes (Signed)
 Phone [phone] In person visit   Subjective:   Allen Robinson is a 89 y.o. year old very pleasant male patient who presents for/with See problem oriented charting Chief Complaint  Patient presents with   Medical Management of Chronic Issues   Hyperlipidemia    Past Medical History-  Patient Active Problem List   Diagnosis Date Noted   DNR (do not resuscitate) 04/16/2024    Priority: High   Memory loss 11/29/2023    Priority: High   Aortic atherosclerosis (HCC) 05/21/2019    Priority: Medium    Gout 03/14/2017    Priority: Medium    BPH associated with nocturia 03/14/2017    Priority: Medium    Hyperlipidemia 03/14/2017    Priority: Medium    GERD (gastroesophageal reflux disease) 03/14/2017    Priority: Medium    Senile purpura (HCC) 05/03/2020    Priority: Low   Right rib fracture 05/15/2019    Priority: Low   Hyperglycemia 04/28/2019    Priority: Low   Vitamin D  deficiency 03/14/2017    Priority: Low   Peripheral polyneuropathy 02/16/2016    Priority: Low   Diplopia 11/19/2015    Priority: Low    Medications- reviewed and updated Current Outpatient Medications  Medication Sig Dispense Refill   allopurinol  (ZYLOPRIM ) 100 MG tablet TAKE TWO TABLETS BY MOUTH DAILY TO PREVENT GOUT, NOT FOR ACUTE ATTACKS, MUST TAKE EVERY DAY     colchicine  0.6 MG tablet Take 0.6 mg by mouth as needed.     Cyanocobalamin  (VITAMIN B 12 PO) Take by mouth.     Multiple Vitamin (MULTIVITAMIN ADULT PO) Take by mouth.     omeprazole (PRILOSEC) 20 MG capsule Take 20 mg by mouth daily as needed.      rosuvastatin (CRESTOR) 5 MG tablet Take 1 tablet (5 mg total) by mouth daily. 90 tablet 3   No current facility-administered medications for this visit.     Objective:  BP 100/70   Pulse 65   Temp 97.7 F (36.5 C)   Ht 5\' 10"  (1.778 m)   Wt 164 lb 9.6 oz (74.7 kg)   SpO2 96%   BMI 23.62 kg/m  Gen: NAD, resting comfortably CV: RRR no murmurs rubs or gallops Lungs: CTAB  no crackles, wheeze, rhonchi Ext: no edema Skin: warm, dry Neuro: walks with walker    Assessment and Plan   #code status- discussed DNR/DNI today- he opts in and yellow form completed for home  #memory loss- has seen Marylene Snuffer with neurology  S: per [place] neurology was not diagnosed as dementia and at his age recommended hold off on antidementia medicines - basically released from [place] [facility] -MRI  brain 07/20/23- no obvious stroke- some chronic microvascular ischemia  -they also did not have much to add for balance issues- [facility] (has done in past) and recommend walker - [facility] - scheduled in June on the 11th for initial appointment A/P: memory loss noted- upcoming visit with [facility] neurology for 2nd opinion  # Balance Issues  S: proprioception issues with neuropathy contribute, cannot go up stairs safely without railings. No falls with using walker consistently.  -Did not [facility]. DID better with Barbra Ley and he is using cane more regularly in 2023 and 2024 -[facility] also [facility] to [facility] for Parkinson's- sounds like DAT from June 21, 2018 that was reportedly negative A/P:  ongoing balance issues not obviously improved- but thankfully no falls with walker   #B12 relative deficiency- taking oral B12  Lab Results  Component Value Date   VITAMINB12 385 10/15/2023  - reasonable control last visit    # Hyperlipidemia/aortic atherosclerosis - incidental finding on prior imaging with ideal LDL goal under 70 S:Medication : none  -prior  Simvastatin  40 mg-takes half-  trial off 12/2023 to see if helps memory - but no change as above Lab Results  Component Value Date   CHOL 224 (A) 04/07/2024   HDL 41 04/07/2024   LDLCALC 170 04/07/2024   TRIG 134 04/07/2024   CHOLHDL 3 11/03/2021  A/P: LDL was up to 170 off of medication and no change/improvement in memory so we  opted to -today we opted to restart low statin at low dose rosuvastatin 5 mg daily and see how he does with this- can recheck labs next visit as LDL did elevate substantially off of medicine  Aortic atherosclerosis (presumed stable)- LDL goal ideally <70 - hoping to get closer to this goal with rosuvastatin- may titrate up in future  #Gout-uric acid has been below 6 S: no flares  on Allopurinol  200 mg tablet.  Has colchicine  available if needed  A/P:doing well with no flares- continue current medications   #prediabetes- last [facility]c of 6 [facility] Lab Results  Component Value Date   HGBA1C 6.0 04/07/2024   HGBA1C 6.1 08/20/2023   HGBA1C 6.2 02/20/2023   Recommended follow up: Return in about 6 months (around 10/17/2024) for followup or sooner if needed.Schedule b4 you leave. Future Appointments  Date Time Provider Department Center  02/17/2025  3:00 PM [facility] ANNUAL WELLNESS VISIT 1 [facility]    Lab/Order associations:   ICD-10-CM   1. Memory loss  R41.3     2. Hyperlipidemia, unspecified hyperlipidemia type  E78.5     3. Balance disorder  R26.89     4. Prediabetes  R73.03     5. Aortic atherosclerosis (HCC)  I70.0     6. Chronic idiopathic gout involving toe without tophus, unspecified laterality  M1A.0790     7. DNR (do not resuscitate)  Z66       Meds ordered this encounter  Medications   rosuvastatin (CRESTOR) 5 MG tablet    Sig: Take 1 tablet (5 mg total) by mouth daily.    Dispense:  90 tablet    Refill:  3    Return precautions advised.  Clarisa Crooked, MD

## 2024-04-17 DIAGNOSIS — R2681 Unsteadiness on feet: Secondary | ICD-10-CM | POA: Diagnosis not present

## 2024-04-17 DIAGNOSIS — R4789 Other speech disturbances: Secondary | ICD-10-CM | POA: Diagnosis not present

## 2024-04-17 DIAGNOSIS — R488 Other symbolic dysfunctions: Secondary | ICD-10-CM | POA: Diagnosis not present

## 2024-04-17 DIAGNOSIS — R2689 Other abnormalities of gait and mobility: Secondary | ICD-10-CM | POA: Diagnosis not present

## 2024-04-18 DIAGNOSIS — R4789 Other speech disturbances: Secondary | ICD-10-CM | POA: Diagnosis not present

## 2024-04-18 DIAGNOSIS — R2681 Unsteadiness on feet: Secondary | ICD-10-CM | POA: Diagnosis not present

## 2024-04-18 DIAGNOSIS — R2689 Other abnormalities of gait and mobility: Secondary | ICD-10-CM | POA: Diagnosis not present

## 2024-04-18 DIAGNOSIS — R488 Other symbolic dysfunctions: Secondary | ICD-10-CM | POA: Diagnosis not present

## 2024-04-21 DIAGNOSIS — R4789 Other speech disturbances: Secondary | ICD-10-CM | POA: Diagnosis not present

## 2024-04-21 DIAGNOSIS — R2689 Other abnormalities of gait and mobility: Secondary | ICD-10-CM | POA: Diagnosis not present

## 2024-04-21 DIAGNOSIS — R488 Other symbolic dysfunctions: Secondary | ICD-10-CM | POA: Diagnosis not present

## 2024-04-21 DIAGNOSIS — R2681 Unsteadiness on feet: Secondary | ICD-10-CM | POA: Diagnosis not present

## 2024-04-22 DIAGNOSIS — R4789 Other speech disturbances: Secondary | ICD-10-CM | POA: Diagnosis not present

## 2024-04-22 DIAGNOSIS — R488 Other symbolic dysfunctions: Secondary | ICD-10-CM | POA: Diagnosis not present

## 2024-04-23 DIAGNOSIS — R2689 Other abnormalities of gait and mobility: Secondary | ICD-10-CM | POA: Diagnosis not present

## 2024-04-23 DIAGNOSIS — R2681 Unsteadiness on feet: Secondary | ICD-10-CM | POA: Diagnosis not present

## 2024-04-29 DIAGNOSIS — R4789 Other speech disturbances: Secondary | ICD-10-CM | POA: Diagnosis not present

## 2024-04-29 DIAGNOSIS — R488 Other symbolic dysfunctions: Secondary | ICD-10-CM | POA: Diagnosis not present

## 2024-04-30 DIAGNOSIS — R4789 Other speech disturbances: Secondary | ICD-10-CM | POA: Diagnosis not present

## 2024-04-30 DIAGNOSIS — R488 Other symbolic dysfunctions: Secondary | ICD-10-CM | POA: Diagnosis not present

## 2024-05-01 DIAGNOSIS — R2681 Unsteadiness on feet: Secondary | ICD-10-CM | POA: Diagnosis not present

## 2024-05-01 DIAGNOSIS — R2689 Other abnormalities of gait and mobility: Secondary | ICD-10-CM | POA: Diagnosis not present

## 2024-05-02 DIAGNOSIS — R2681 Unsteadiness on feet: Secondary | ICD-10-CM | POA: Diagnosis not present

## 2024-05-02 DIAGNOSIS — R488 Other symbolic dysfunctions: Secondary | ICD-10-CM | POA: Diagnosis not present

## 2024-05-02 DIAGNOSIS — R2689 Other abnormalities of gait and mobility: Secondary | ICD-10-CM | POA: Diagnosis not present

## 2024-05-02 DIAGNOSIS — R4789 Other speech disturbances: Secondary | ICD-10-CM | POA: Diagnosis not present

## 2024-05-05 DIAGNOSIS — R488 Other symbolic dysfunctions: Secondary | ICD-10-CM | POA: Diagnosis not present

## 2024-05-05 DIAGNOSIS — R2681 Unsteadiness on feet: Secondary | ICD-10-CM | POA: Diagnosis not present

## 2024-05-05 DIAGNOSIS — R4789 Other speech disturbances: Secondary | ICD-10-CM | POA: Diagnosis not present

## 2024-05-05 DIAGNOSIS — R2689 Other abnormalities of gait and mobility: Secondary | ICD-10-CM | POA: Diagnosis not present

## 2024-05-07 DIAGNOSIS — R4789 Other speech disturbances: Secondary | ICD-10-CM | POA: Diagnosis not present

## 2024-05-07 DIAGNOSIS — R488 Other symbolic dysfunctions: Secondary | ICD-10-CM | POA: Diagnosis not present

## 2024-05-08 DIAGNOSIS — R2681 Unsteadiness on feet: Secondary | ICD-10-CM | POA: Diagnosis not present

## 2024-05-08 DIAGNOSIS — R2689 Other abnormalities of gait and mobility: Secondary | ICD-10-CM | POA: Diagnosis not present

## 2024-05-09 DIAGNOSIS — R4789 Other speech disturbances: Secondary | ICD-10-CM | POA: Diagnosis not present

## 2024-05-09 DIAGNOSIS — R488 Other symbolic dysfunctions: Secondary | ICD-10-CM | POA: Diagnosis not present

## 2024-05-12 DIAGNOSIS — R4789 Other speech disturbances: Secondary | ICD-10-CM | POA: Diagnosis not present

## 2024-05-12 DIAGNOSIS — R488 Other symbolic dysfunctions: Secondary | ICD-10-CM | POA: Diagnosis not present

## 2024-05-14 DIAGNOSIS — R4789 Other speech disturbances: Secondary | ICD-10-CM | POA: Diagnosis not present

## 2024-05-14 DIAGNOSIS — R2681 Unsteadiness on feet: Secondary | ICD-10-CM | POA: Diagnosis not present

## 2024-05-14 DIAGNOSIS — R488 Other symbolic dysfunctions: Secondary | ICD-10-CM | POA: Diagnosis not present

## 2024-05-14 DIAGNOSIS — R2689 Other abnormalities of gait and mobility: Secondary | ICD-10-CM | POA: Diagnosis not present

## 2024-05-16 DIAGNOSIS — R4789 Other speech disturbances: Secondary | ICD-10-CM | POA: Diagnosis not present

## 2024-05-16 DIAGNOSIS — R2681 Unsteadiness on feet: Secondary | ICD-10-CM | POA: Diagnosis not present

## 2024-05-16 DIAGNOSIS — R488 Other symbolic dysfunctions: Secondary | ICD-10-CM | POA: Diagnosis not present

## 2024-05-16 DIAGNOSIS — R2689 Other abnormalities of gait and mobility: Secondary | ICD-10-CM | POA: Diagnosis not present

## 2024-05-20 DIAGNOSIS — R488 Other symbolic dysfunctions: Secondary | ICD-10-CM | POA: Diagnosis not present

## 2024-05-20 DIAGNOSIS — R2689 Other abnormalities of gait and mobility: Secondary | ICD-10-CM | POA: Diagnosis not present

## 2024-05-20 DIAGNOSIS — R4789 Other speech disturbances: Secondary | ICD-10-CM | POA: Diagnosis not present

## 2024-05-20 DIAGNOSIS — R2681 Unsteadiness on feet: Secondary | ICD-10-CM | POA: Diagnosis not present

## 2024-05-21 DIAGNOSIS — R488 Other symbolic dysfunctions: Secondary | ICD-10-CM | POA: Diagnosis not present

## 2024-05-21 DIAGNOSIS — R4789 Other speech disturbances: Secondary | ICD-10-CM | POA: Diagnosis not present

## 2024-05-22 DIAGNOSIS — R2689 Other abnormalities of gait and mobility: Secondary | ICD-10-CM | POA: Diagnosis not present

## 2024-05-22 DIAGNOSIS — R2681 Unsteadiness on feet: Secondary | ICD-10-CM | POA: Diagnosis not present

## 2024-05-23 DIAGNOSIS — R4789 Other speech disturbances: Secondary | ICD-10-CM | POA: Diagnosis not present

## 2024-05-23 DIAGNOSIS — R488 Other symbolic dysfunctions: Secondary | ICD-10-CM | POA: Diagnosis not present

## 2024-05-25 DIAGNOSIS — R4789 Other speech disturbances: Secondary | ICD-10-CM | POA: Diagnosis not present

## 2024-05-25 DIAGNOSIS — R488 Other symbolic dysfunctions: Secondary | ICD-10-CM | POA: Diagnosis not present

## 2024-05-26 DIAGNOSIS — R488 Other symbolic dysfunctions: Secondary | ICD-10-CM | POA: Diagnosis not present

## 2024-05-26 DIAGNOSIS — R2689 Other abnormalities of gait and mobility: Secondary | ICD-10-CM | POA: Diagnosis not present

## 2024-05-26 DIAGNOSIS — R4789 Other speech disturbances: Secondary | ICD-10-CM | POA: Diagnosis not present

## 2024-05-26 DIAGNOSIS — R2681 Unsteadiness on feet: Secondary | ICD-10-CM | POA: Diagnosis not present

## 2024-05-27 DIAGNOSIS — R4789 Other speech disturbances: Secondary | ICD-10-CM | POA: Diagnosis not present

## 2024-05-27 DIAGNOSIS — R488 Other symbolic dysfunctions: Secondary | ICD-10-CM | POA: Diagnosis not present

## 2024-05-28 DIAGNOSIS — R2681 Unsteadiness on feet: Secondary | ICD-10-CM | POA: Diagnosis not present

## 2024-05-28 DIAGNOSIS — R2689 Other abnormalities of gait and mobility: Secondary | ICD-10-CM | POA: Diagnosis not present

## 2024-05-28 DIAGNOSIS — R488 Other symbolic dysfunctions: Secondary | ICD-10-CM | POA: Diagnosis not present

## 2024-05-28 DIAGNOSIS — R4789 Other speech disturbances: Secondary | ICD-10-CM | POA: Diagnosis not present

## 2024-06-02 DIAGNOSIS — R488 Other symbolic dysfunctions: Secondary | ICD-10-CM | POA: Diagnosis not present

## 2024-06-02 DIAGNOSIS — R4789 Other speech disturbances: Secondary | ICD-10-CM | POA: Diagnosis not present

## 2024-06-03 DIAGNOSIS — R488 Other symbolic dysfunctions: Secondary | ICD-10-CM | POA: Diagnosis not present

## 2024-06-03 DIAGNOSIS — R4789 Other speech disturbances: Secondary | ICD-10-CM | POA: Diagnosis not present

## 2024-06-04 DIAGNOSIS — R488 Other symbolic dysfunctions: Secondary | ICD-10-CM | POA: Diagnosis not present

## 2024-06-04 DIAGNOSIS — R4789 Other speech disturbances: Secondary | ICD-10-CM | POA: Diagnosis not present

## 2024-06-05 DIAGNOSIS — R2689 Other abnormalities of gait and mobility: Secondary | ICD-10-CM | POA: Diagnosis not present

## 2024-06-05 DIAGNOSIS — R4789 Other speech disturbances: Secondary | ICD-10-CM | POA: Diagnosis not present

## 2024-06-05 DIAGNOSIS — R488 Other symbolic dysfunctions: Secondary | ICD-10-CM | POA: Diagnosis not present

## 2024-06-05 DIAGNOSIS — R2681 Unsteadiness on feet: Secondary | ICD-10-CM | POA: Diagnosis not present

## 2024-06-06 DIAGNOSIS — R2681 Unsteadiness on feet: Secondary | ICD-10-CM | POA: Diagnosis not present

## 2024-06-06 DIAGNOSIS — R2689 Other abnormalities of gait and mobility: Secondary | ICD-10-CM | POA: Diagnosis not present

## 2024-06-09 DIAGNOSIS — R4789 Other speech disturbances: Secondary | ICD-10-CM | POA: Diagnosis not present

## 2024-06-09 DIAGNOSIS — R488 Other symbolic dysfunctions: Secondary | ICD-10-CM | POA: Diagnosis not present

## 2024-06-10 DIAGNOSIS — R4789 Other speech disturbances: Secondary | ICD-10-CM | POA: Diagnosis not present

## 2024-06-10 DIAGNOSIS — R2681 Unsteadiness on feet: Secondary | ICD-10-CM | POA: Diagnosis not present

## 2024-06-10 DIAGNOSIS — R2689 Other abnormalities of gait and mobility: Secondary | ICD-10-CM | POA: Diagnosis not present

## 2024-06-10 DIAGNOSIS — R488 Other symbolic dysfunctions: Secondary | ICD-10-CM | POA: Diagnosis not present

## 2024-06-11 DIAGNOSIS — R4789 Other speech disturbances: Secondary | ICD-10-CM | POA: Diagnosis not present

## 2024-06-11 DIAGNOSIS — R488 Other symbolic dysfunctions: Secondary | ICD-10-CM | POA: Diagnosis not present

## 2024-06-13 DIAGNOSIS — R2689 Other abnormalities of gait and mobility: Secondary | ICD-10-CM | POA: Diagnosis not present

## 2024-06-13 DIAGNOSIS — R488 Other symbolic dysfunctions: Secondary | ICD-10-CM | POA: Diagnosis not present

## 2024-06-13 DIAGNOSIS — R2681 Unsteadiness on feet: Secondary | ICD-10-CM | POA: Diagnosis not present

## 2024-06-13 DIAGNOSIS — R4789 Other speech disturbances: Secondary | ICD-10-CM | POA: Diagnosis not present

## 2024-06-16 DIAGNOSIS — R488 Other symbolic dysfunctions: Secondary | ICD-10-CM | POA: Diagnosis not present

## 2024-06-16 DIAGNOSIS — R4789 Other speech disturbances: Secondary | ICD-10-CM | POA: Diagnosis not present

## 2024-06-17 DIAGNOSIS — R4789 Other speech disturbances: Secondary | ICD-10-CM | POA: Diagnosis not present

## 2024-06-17 DIAGNOSIS — R488 Other symbolic dysfunctions: Secondary | ICD-10-CM | POA: Diagnosis not present

## 2024-06-18 DIAGNOSIS — R4789 Other speech disturbances: Secondary | ICD-10-CM | POA: Diagnosis not present

## 2024-06-18 DIAGNOSIS — R2689 Other abnormalities of gait and mobility: Secondary | ICD-10-CM | POA: Diagnosis not present

## 2024-06-18 DIAGNOSIS — R488 Other symbolic dysfunctions: Secondary | ICD-10-CM | POA: Diagnosis not present

## 2024-06-18 DIAGNOSIS — R2681 Unsteadiness on feet: Secondary | ICD-10-CM | POA: Diagnosis not present

## 2024-06-19 DIAGNOSIS — R2689 Other abnormalities of gait and mobility: Secondary | ICD-10-CM | POA: Diagnosis not present

## 2024-06-19 DIAGNOSIS — R2681 Unsteadiness on feet: Secondary | ICD-10-CM | POA: Diagnosis not present

## 2024-06-20 DIAGNOSIS — R488 Other symbolic dysfunctions: Secondary | ICD-10-CM | POA: Diagnosis not present

## 2024-06-20 DIAGNOSIS — R4789 Other speech disturbances: Secondary | ICD-10-CM | POA: Diagnosis not present

## 2024-06-26 DIAGNOSIS — R4789 Other speech disturbances: Secondary | ICD-10-CM | POA: Diagnosis not present

## 2024-06-26 DIAGNOSIS — R488 Other symbolic dysfunctions: Secondary | ICD-10-CM | POA: Diagnosis not present

## 2024-06-27 DIAGNOSIS — R488 Other symbolic dysfunctions: Secondary | ICD-10-CM | POA: Diagnosis not present

## 2024-06-27 DIAGNOSIS — R4789 Other speech disturbances: Secondary | ICD-10-CM | POA: Diagnosis not present

## 2024-06-30 DIAGNOSIS — R488 Other symbolic dysfunctions: Secondary | ICD-10-CM | POA: Diagnosis not present

## 2024-06-30 DIAGNOSIS — R4789 Other speech disturbances: Secondary | ICD-10-CM | POA: Diagnosis not present

## 2024-07-01 DIAGNOSIS — R2689 Other abnormalities of gait and mobility: Secondary | ICD-10-CM | POA: Diagnosis not present

## 2024-07-01 DIAGNOSIS — R4789 Other speech disturbances: Secondary | ICD-10-CM | POA: Diagnosis not present

## 2024-07-01 DIAGNOSIS — R488 Other symbolic dysfunctions: Secondary | ICD-10-CM | POA: Diagnosis not present

## 2024-07-01 DIAGNOSIS — R2681 Unsteadiness on feet: Secondary | ICD-10-CM | POA: Diagnosis not present

## 2024-07-02 DIAGNOSIS — R4789 Other speech disturbances: Secondary | ICD-10-CM | POA: Diagnosis not present

## 2024-07-02 DIAGNOSIS — R488 Other symbolic dysfunctions: Secondary | ICD-10-CM | POA: Diagnosis not present

## 2024-07-03 DIAGNOSIS — R2689 Other abnormalities of gait and mobility: Secondary | ICD-10-CM | POA: Diagnosis not present

## 2024-07-03 DIAGNOSIS — R2681 Unsteadiness on feet: Secondary | ICD-10-CM | POA: Diagnosis not present

## 2024-07-07 DIAGNOSIS — R488 Other symbolic dysfunctions: Secondary | ICD-10-CM | POA: Diagnosis not present

## 2024-07-07 DIAGNOSIS — R4789 Other speech disturbances: Secondary | ICD-10-CM | POA: Diagnosis not present

## 2024-07-08 DIAGNOSIS — R2689 Other abnormalities of gait and mobility: Secondary | ICD-10-CM | POA: Diagnosis not present

## 2024-07-08 DIAGNOSIS — R2681 Unsteadiness on feet: Secondary | ICD-10-CM | POA: Diagnosis not present

## 2024-07-09 DIAGNOSIS — R488 Other symbolic dysfunctions: Secondary | ICD-10-CM | POA: Diagnosis not present

## 2024-07-09 DIAGNOSIS — R4789 Other speech disturbances: Secondary | ICD-10-CM | POA: Diagnosis not present

## 2024-07-11 DIAGNOSIS — R4789 Other speech disturbances: Secondary | ICD-10-CM | POA: Diagnosis not present

## 2024-07-11 DIAGNOSIS — R2681 Unsteadiness on feet: Secondary | ICD-10-CM | POA: Diagnosis not present

## 2024-07-11 DIAGNOSIS — R488 Other symbolic dysfunctions: Secondary | ICD-10-CM | POA: Diagnosis not present

## 2024-07-11 DIAGNOSIS — R2689 Other abnormalities of gait and mobility: Secondary | ICD-10-CM | POA: Diagnosis not present

## 2024-07-14 DIAGNOSIS — R488 Other symbolic dysfunctions: Secondary | ICD-10-CM | POA: Diagnosis not present

## 2024-07-14 DIAGNOSIS — R4789 Other speech disturbances: Secondary | ICD-10-CM | POA: Diagnosis not present

## 2024-07-15 DIAGNOSIS — R2689 Other abnormalities of gait and mobility: Secondary | ICD-10-CM | POA: Diagnosis not present

## 2024-07-15 DIAGNOSIS — R2681 Unsteadiness on feet: Secondary | ICD-10-CM | POA: Diagnosis not present

## 2024-07-16 DIAGNOSIS — R2689 Other abnormalities of gait and mobility: Secondary | ICD-10-CM | POA: Diagnosis not present

## 2024-07-16 DIAGNOSIS — R488 Other symbolic dysfunctions: Secondary | ICD-10-CM | POA: Diagnosis not present

## 2024-07-16 DIAGNOSIS — R2681 Unsteadiness on feet: Secondary | ICD-10-CM | POA: Diagnosis not present

## 2024-07-16 DIAGNOSIS — R4789 Other speech disturbances: Secondary | ICD-10-CM | POA: Diagnosis not present

## 2024-07-18 DIAGNOSIS — R488 Other symbolic dysfunctions: Secondary | ICD-10-CM | POA: Diagnosis not present

## 2024-07-18 DIAGNOSIS — R4789 Other speech disturbances: Secondary | ICD-10-CM | POA: Diagnosis not present

## 2024-07-21 DIAGNOSIS — R4789 Other speech disturbances: Secondary | ICD-10-CM | POA: Diagnosis not present

## 2024-07-21 DIAGNOSIS — R488 Other symbolic dysfunctions: Secondary | ICD-10-CM | POA: Diagnosis not present

## 2024-07-22 DIAGNOSIS — R2681 Unsteadiness on feet: Secondary | ICD-10-CM | POA: Diagnosis not present

## 2024-07-22 DIAGNOSIS — R2689 Other abnormalities of gait and mobility: Secondary | ICD-10-CM | POA: Diagnosis not present

## 2024-07-23 DIAGNOSIS — R2689 Other abnormalities of gait and mobility: Secondary | ICD-10-CM | POA: Diagnosis not present

## 2024-07-23 DIAGNOSIS — R2681 Unsteadiness on feet: Secondary | ICD-10-CM | POA: Diagnosis not present

## 2024-07-24 DIAGNOSIS — R488 Other symbolic dysfunctions: Secondary | ICD-10-CM | POA: Diagnosis not present

## 2024-07-24 DIAGNOSIS — M2012 Hallux valgus (acquired), left foot: Secondary | ICD-10-CM | POA: Diagnosis not present

## 2024-07-24 DIAGNOSIS — R4789 Other speech disturbances: Secondary | ICD-10-CM | POA: Diagnosis not present

## 2024-07-24 DIAGNOSIS — B351 Tinea unguium: Secondary | ICD-10-CM | POA: Diagnosis not present

## 2024-07-25 DIAGNOSIS — R4789 Other speech disturbances: Secondary | ICD-10-CM | POA: Diagnosis not present

## 2024-07-25 DIAGNOSIS — R488 Other symbolic dysfunctions: Secondary | ICD-10-CM | POA: Diagnosis not present

## 2024-07-29 DIAGNOSIS — R2681 Unsteadiness on feet: Secondary | ICD-10-CM | POA: Diagnosis not present

## 2024-07-29 DIAGNOSIS — R488 Other symbolic dysfunctions: Secondary | ICD-10-CM | POA: Diagnosis not present

## 2024-07-29 DIAGNOSIS — R4789 Other speech disturbances: Secondary | ICD-10-CM | POA: Diagnosis not present

## 2024-07-29 DIAGNOSIS — R2689 Other abnormalities of gait and mobility: Secondary | ICD-10-CM | POA: Diagnosis not present

## 2024-07-29 DIAGNOSIS — R296 Repeated falls: Secondary | ICD-10-CM | POA: Diagnosis not present

## 2024-07-31 DIAGNOSIS — Z961 Presence of intraocular lens: Secondary | ICD-10-CM | POA: Diagnosis not present

## 2024-07-31 DIAGNOSIS — R2689 Other abnormalities of gait and mobility: Secondary | ICD-10-CM | POA: Diagnosis not present

## 2024-07-31 DIAGNOSIS — H04123 Dry eye syndrome of bilateral lacrimal glands: Secondary | ICD-10-CM | POA: Diagnosis not present

## 2024-07-31 DIAGNOSIS — H52203 Unspecified astigmatism, bilateral: Secondary | ICD-10-CM | POA: Diagnosis not present

## 2024-07-31 DIAGNOSIS — R2681 Unsteadiness on feet: Secondary | ICD-10-CM | POA: Diagnosis not present

## 2024-08-05 DIAGNOSIS — R2681 Unsteadiness on feet: Secondary | ICD-10-CM | POA: Diagnosis not present

## 2024-08-05 DIAGNOSIS — R4789 Other speech disturbances: Secondary | ICD-10-CM | POA: Diagnosis not present

## 2024-08-05 DIAGNOSIS — R2689 Other abnormalities of gait and mobility: Secondary | ICD-10-CM | POA: Diagnosis not present

## 2024-08-05 DIAGNOSIS — R488 Other symbolic dysfunctions: Secondary | ICD-10-CM | POA: Diagnosis not present

## 2024-08-06 DIAGNOSIS — R2681 Unsteadiness on feet: Secondary | ICD-10-CM | POA: Diagnosis not present

## 2024-08-06 DIAGNOSIS — R488 Other symbolic dysfunctions: Secondary | ICD-10-CM | POA: Diagnosis not present

## 2024-08-06 DIAGNOSIS — R296 Repeated falls: Secondary | ICD-10-CM | POA: Diagnosis not present

## 2024-08-07 DIAGNOSIS — R2689 Other abnormalities of gait and mobility: Secondary | ICD-10-CM | POA: Diagnosis not present

## 2024-08-07 DIAGNOSIS — R2681 Unsteadiness on feet: Secondary | ICD-10-CM | POA: Diagnosis not present

## 2024-08-07 DIAGNOSIS — R488 Other symbolic dysfunctions: Secondary | ICD-10-CM | POA: Diagnosis not present

## 2024-08-07 DIAGNOSIS — R4789 Other speech disturbances: Secondary | ICD-10-CM | POA: Diagnosis not present

## 2024-08-08 DIAGNOSIS — R488 Other symbolic dysfunctions: Secondary | ICD-10-CM | POA: Diagnosis not present

## 2024-08-08 DIAGNOSIS — R4789 Other speech disturbances: Secondary | ICD-10-CM | POA: Diagnosis not present

## 2024-08-11 DIAGNOSIS — R4789 Other speech disturbances: Secondary | ICD-10-CM | POA: Diagnosis not present

## 2024-08-11 DIAGNOSIS — R488 Other symbolic dysfunctions: Secondary | ICD-10-CM | POA: Diagnosis not present

## 2024-08-12 DIAGNOSIS — R2681 Unsteadiness on feet: Secondary | ICD-10-CM | POA: Diagnosis not present

## 2024-08-12 DIAGNOSIS — R488 Other symbolic dysfunctions: Secondary | ICD-10-CM | POA: Diagnosis not present

## 2024-08-12 DIAGNOSIS — R2689 Other abnormalities of gait and mobility: Secondary | ICD-10-CM | POA: Diagnosis not present

## 2024-08-12 DIAGNOSIS — R4789 Other speech disturbances: Secondary | ICD-10-CM | POA: Diagnosis not present

## 2024-08-13 DIAGNOSIS — R296 Repeated falls: Secondary | ICD-10-CM | POA: Diagnosis not present

## 2024-08-13 DIAGNOSIS — Z85828 Personal history of other malignant neoplasm of skin: Secondary | ICD-10-CM | POA: Diagnosis not present

## 2024-08-13 DIAGNOSIS — R488 Other symbolic dysfunctions: Secondary | ICD-10-CM | POA: Diagnosis not present

## 2024-08-13 DIAGNOSIS — L821 Other seborrheic keratosis: Secondary | ICD-10-CM | POA: Diagnosis not present

## 2024-08-13 DIAGNOSIS — L820 Inflamed seborrheic keratosis: Secondary | ICD-10-CM | POA: Diagnosis not present

## 2024-08-13 DIAGNOSIS — D1801 Hemangioma of skin and subcutaneous tissue: Secondary | ICD-10-CM | POA: Diagnosis not present

## 2024-08-13 DIAGNOSIS — L304 Erythema intertrigo: Secondary | ICD-10-CM | POA: Diagnosis not present

## 2024-08-13 DIAGNOSIS — R2681 Unsteadiness on feet: Secondary | ICD-10-CM | POA: Diagnosis not present

## 2024-08-14 DIAGNOSIS — R2681 Unsteadiness on feet: Secondary | ICD-10-CM | POA: Diagnosis not present

## 2024-08-14 DIAGNOSIS — R2689 Other abnormalities of gait and mobility: Secondary | ICD-10-CM | POA: Diagnosis not present

## 2024-08-15 DIAGNOSIS — R488 Other symbolic dysfunctions: Secondary | ICD-10-CM | POA: Diagnosis not present

## 2024-08-15 DIAGNOSIS — R4789 Other speech disturbances: Secondary | ICD-10-CM | POA: Diagnosis not present

## 2024-08-18 DIAGNOSIS — R488 Other symbolic dysfunctions: Secondary | ICD-10-CM | POA: Diagnosis not present

## 2024-08-18 DIAGNOSIS — Z23 Encounter for immunization: Secondary | ICD-10-CM | POA: Diagnosis not present

## 2024-08-18 DIAGNOSIS — R4789 Other speech disturbances: Secondary | ICD-10-CM | POA: Diagnosis not present

## 2024-08-19 DIAGNOSIS — R488 Other symbolic dysfunctions: Secondary | ICD-10-CM | POA: Diagnosis not present

## 2024-08-19 DIAGNOSIS — R2689 Other abnormalities of gait and mobility: Secondary | ICD-10-CM | POA: Diagnosis not present

## 2024-08-19 DIAGNOSIS — R2681 Unsteadiness on feet: Secondary | ICD-10-CM | POA: Diagnosis not present

## 2024-08-19 DIAGNOSIS — R296 Repeated falls: Secondary | ICD-10-CM | POA: Diagnosis not present

## 2024-08-20 DIAGNOSIS — R4789 Other speech disturbances: Secondary | ICD-10-CM | POA: Diagnosis not present

## 2024-08-20 DIAGNOSIS — R488 Other symbolic dysfunctions: Secondary | ICD-10-CM | POA: Diagnosis not present

## 2024-08-21 DIAGNOSIS — R2689 Other abnormalities of gait and mobility: Secondary | ICD-10-CM | POA: Diagnosis not present

## 2024-08-21 DIAGNOSIS — R2681 Unsteadiness on feet: Secondary | ICD-10-CM | POA: Diagnosis not present

## 2024-08-22 DIAGNOSIS — R4789 Other speech disturbances: Secondary | ICD-10-CM | POA: Diagnosis not present

## 2024-08-22 DIAGNOSIS — R488 Other symbolic dysfunctions: Secondary | ICD-10-CM | POA: Diagnosis not present

## 2024-08-25 DIAGNOSIS — R2689 Other abnormalities of gait and mobility: Secondary | ICD-10-CM | POA: Diagnosis not present

## 2024-08-25 DIAGNOSIS — R2681 Unsteadiness on feet: Secondary | ICD-10-CM | POA: Diagnosis not present

## 2024-08-26 LAB — LIPID PANEL
Cholesterol: 142 (ref 0–200)
HDL: 39 (ref 35–70)
LDL Cholesterol: 88
Triglycerides: 91 (ref 40–160)

## 2024-08-26 LAB — CBC AND DIFFERENTIAL
HCT: 38 — AB (ref 41–53)
Hemoglobin: 12.8 — AB (ref 13.5–17.5)
Neutrophils Absolute: 3.96
Platelets: 160 K/uL (ref 150–400)
WBC: 5.8

## 2024-08-26 LAB — HEMOGLOBIN A1C: Hemoglobin A1C: 6.1

## 2024-08-26 LAB — BASIC METABOLIC PANEL WITH GFR
BUN: 29 — AB (ref 4–21)
CO2: 26 — AB (ref 13–22)
Chloride: 107 (ref 99–108)
Creatinine: 1.3 (ref 0.6–1.3)
Glucose: 86
Potassium: 4.7 meq/L (ref 3.5–5.1)
Sodium: 142 (ref 137–147)

## 2024-08-26 LAB — HEPATIC FUNCTION PANEL
ALT: 33 U/L (ref 10–40)
AST: 33 (ref 14–40)
Alkaline Phosphatase: 96 (ref 25–125)
Bilirubin, Total: 0.8

## 2024-08-26 LAB — CBC: RBC: 4.09 (ref 3.87–5.11)

## 2024-08-26 LAB — COMPREHENSIVE METABOLIC PANEL WITH GFR
Calcium: 9.1 (ref 8.7–10.7)
eGFR: 51

## 2024-08-27 DIAGNOSIS — R4789 Other speech disturbances: Secondary | ICD-10-CM | POA: Diagnosis not present

## 2024-08-27 DIAGNOSIS — R2681 Unsteadiness on feet: Secondary | ICD-10-CM | POA: Diagnosis not present

## 2024-08-27 DIAGNOSIS — R2689 Other abnormalities of gait and mobility: Secondary | ICD-10-CM | POA: Diagnosis not present

## 2024-08-27 DIAGNOSIS — R488 Other symbolic dysfunctions: Secondary | ICD-10-CM | POA: Diagnosis not present

## 2024-08-28 DIAGNOSIS — R296 Repeated falls: Secondary | ICD-10-CM | POA: Diagnosis not present

## 2024-08-28 DIAGNOSIS — R488 Other symbolic dysfunctions: Secondary | ICD-10-CM | POA: Diagnosis not present

## 2024-08-28 DIAGNOSIS — R4789 Other speech disturbances: Secondary | ICD-10-CM | POA: Diagnosis not present

## 2024-08-28 DIAGNOSIS — R2681 Unsteadiness on feet: Secondary | ICD-10-CM | POA: Diagnosis not present

## 2024-08-29 DIAGNOSIS — R488 Other symbolic dysfunctions: Secondary | ICD-10-CM | POA: Diagnosis not present

## 2024-08-29 DIAGNOSIS — R4789 Other speech disturbances: Secondary | ICD-10-CM | POA: Diagnosis not present

## 2024-09-01 DIAGNOSIS — R488 Other symbolic dysfunctions: Secondary | ICD-10-CM | POA: Diagnosis not present

## 2024-09-01 DIAGNOSIS — R2689 Other abnormalities of gait and mobility: Secondary | ICD-10-CM | POA: Diagnosis not present

## 2024-09-01 DIAGNOSIS — R4789 Other speech disturbances: Secondary | ICD-10-CM | POA: Diagnosis not present

## 2024-09-01 DIAGNOSIS — R2681 Unsteadiness on feet: Secondary | ICD-10-CM | POA: Diagnosis not present

## 2024-09-02 DIAGNOSIS — Z23 Encounter for immunization: Secondary | ICD-10-CM | POA: Diagnosis not present

## 2024-09-05 DIAGNOSIS — R4789 Other speech disturbances: Secondary | ICD-10-CM | POA: Diagnosis not present

## 2024-09-05 DIAGNOSIS — R488 Other symbolic dysfunctions: Secondary | ICD-10-CM | POA: Diagnosis not present

## 2024-09-08 DIAGNOSIS — R488 Other symbolic dysfunctions: Secondary | ICD-10-CM | POA: Diagnosis not present

## 2024-09-08 DIAGNOSIS — R4789 Other speech disturbances: Secondary | ICD-10-CM | POA: Diagnosis not present

## 2024-09-09 DIAGNOSIS — R488 Other symbolic dysfunctions: Secondary | ICD-10-CM | POA: Diagnosis not present

## 2024-09-09 DIAGNOSIS — R4789 Other speech disturbances: Secondary | ICD-10-CM | POA: Diagnosis not present

## 2024-09-11 DIAGNOSIS — R296 Repeated falls: Secondary | ICD-10-CM | POA: Diagnosis not present

## 2024-09-11 DIAGNOSIS — R4789 Other speech disturbances: Secondary | ICD-10-CM | POA: Diagnosis not present

## 2024-09-11 DIAGNOSIS — R488 Other symbolic dysfunctions: Secondary | ICD-10-CM | POA: Diagnosis not present

## 2024-09-11 DIAGNOSIS — R2681 Unsteadiness on feet: Secondary | ICD-10-CM | POA: Diagnosis not present

## 2024-09-15 DIAGNOSIS — R4789 Other speech disturbances: Secondary | ICD-10-CM | POA: Diagnosis not present

## 2024-09-15 DIAGNOSIS — R488 Other symbolic dysfunctions: Secondary | ICD-10-CM | POA: Diagnosis not present

## 2024-09-16 DIAGNOSIS — R488 Other symbolic dysfunctions: Secondary | ICD-10-CM | POA: Diagnosis not present

## 2024-09-16 DIAGNOSIS — R4789 Other speech disturbances: Secondary | ICD-10-CM | POA: Diagnosis not present

## 2024-09-16 DIAGNOSIS — R2681 Unsteadiness on feet: Secondary | ICD-10-CM | POA: Diagnosis not present

## 2024-09-16 DIAGNOSIS — R296 Repeated falls: Secondary | ICD-10-CM | POA: Diagnosis not present

## 2024-09-22 DIAGNOSIS — R4789 Other speech disturbances: Secondary | ICD-10-CM | POA: Diagnosis not present

## 2024-09-22 DIAGNOSIS — R488 Other symbolic dysfunctions: Secondary | ICD-10-CM | POA: Diagnosis not present

## 2024-09-24 DIAGNOSIS — R488 Other symbolic dysfunctions: Secondary | ICD-10-CM | POA: Diagnosis not present

## 2024-09-24 DIAGNOSIS — R2681 Unsteadiness on feet: Secondary | ICD-10-CM | POA: Diagnosis not present

## 2024-09-24 DIAGNOSIS — R296 Repeated falls: Secondary | ICD-10-CM | POA: Diagnosis not present

## 2024-09-26 DIAGNOSIS — R488 Other symbolic dysfunctions: Secondary | ICD-10-CM | POA: Diagnosis not present

## 2024-09-26 DIAGNOSIS — R4789 Other speech disturbances: Secondary | ICD-10-CM | POA: Diagnosis not present

## 2024-09-30 DIAGNOSIS — R488 Other symbolic dysfunctions: Secondary | ICD-10-CM | POA: Diagnosis not present

## 2024-09-30 DIAGNOSIS — R4789 Other speech disturbances: Secondary | ICD-10-CM | POA: Diagnosis not present

## 2024-10-01 DIAGNOSIS — R296 Repeated falls: Secondary | ICD-10-CM | POA: Diagnosis not present

## 2024-10-01 DIAGNOSIS — R488 Other symbolic dysfunctions: Secondary | ICD-10-CM | POA: Diagnosis not present

## 2024-10-01 DIAGNOSIS — R2681 Unsteadiness on feet: Secondary | ICD-10-CM | POA: Diagnosis not present

## 2024-10-07 DIAGNOSIS — R4789 Other speech disturbances: Secondary | ICD-10-CM | POA: Diagnosis not present

## 2024-10-07 DIAGNOSIS — R2681 Unsteadiness on feet: Secondary | ICD-10-CM | POA: Diagnosis not present

## 2024-10-07 DIAGNOSIS — R278 Other lack of coordination: Secondary | ICD-10-CM | POA: Diagnosis not present

## 2024-10-07 DIAGNOSIS — R488 Other symbolic dysfunctions: Secondary | ICD-10-CM | POA: Diagnosis not present

## 2024-10-08 DIAGNOSIS — R4789 Other speech disturbances: Secondary | ICD-10-CM | POA: Diagnosis not present

## 2024-10-08 DIAGNOSIS — R2681 Unsteadiness on feet: Secondary | ICD-10-CM | POA: Diagnosis not present

## 2024-10-08 DIAGNOSIS — R488 Other symbolic dysfunctions: Secondary | ICD-10-CM | POA: Diagnosis not present

## 2024-10-08 DIAGNOSIS — R296 Repeated falls: Secondary | ICD-10-CM | POA: Diagnosis not present

## 2024-10-08 DIAGNOSIS — R278 Other lack of coordination: Secondary | ICD-10-CM | POA: Diagnosis not present

## 2024-10-09 DIAGNOSIS — R278 Other lack of coordination: Secondary | ICD-10-CM | POA: Diagnosis not present

## 2024-10-09 DIAGNOSIS — R2681 Unsteadiness on feet: Secondary | ICD-10-CM | POA: Diagnosis not present

## 2024-10-13 DIAGNOSIS — R488 Other symbolic dysfunctions: Secondary | ICD-10-CM | POA: Diagnosis not present

## 2024-10-13 DIAGNOSIS — R296 Repeated falls: Secondary | ICD-10-CM | POA: Diagnosis not present

## 2024-10-13 DIAGNOSIS — R2681 Unsteadiness on feet: Secondary | ICD-10-CM | POA: Diagnosis not present

## 2024-10-13 DIAGNOSIS — R4789 Other speech disturbances: Secondary | ICD-10-CM | POA: Diagnosis not present

## 2024-10-14 DIAGNOSIS — R278 Other lack of coordination: Secondary | ICD-10-CM | POA: Diagnosis not present

## 2024-10-14 DIAGNOSIS — R2681 Unsteadiness on feet: Secondary | ICD-10-CM | POA: Diagnosis not present

## 2024-10-15 DIAGNOSIS — R4789 Other speech disturbances: Secondary | ICD-10-CM | POA: Diagnosis not present

## 2024-10-15 DIAGNOSIS — R488 Other symbolic dysfunctions: Secondary | ICD-10-CM | POA: Diagnosis not present

## 2024-10-15 DIAGNOSIS — R278 Other lack of coordination: Secondary | ICD-10-CM | POA: Diagnosis not present

## 2024-10-15 DIAGNOSIS — R2681 Unsteadiness on feet: Secondary | ICD-10-CM | POA: Diagnosis not present

## 2024-10-17 DIAGNOSIS — R488 Other symbolic dysfunctions: Secondary | ICD-10-CM | POA: Diagnosis not present

## 2024-10-17 DIAGNOSIS — R2681 Unsteadiness on feet: Secondary | ICD-10-CM | POA: Diagnosis not present

## 2024-10-17 DIAGNOSIS — R296 Repeated falls: Secondary | ICD-10-CM | POA: Diagnosis not present

## 2024-10-20 ENCOUNTER — Ambulatory Visit: Admitting: Family Medicine

## 2024-10-20 ENCOUNTER — Encounter: Payer: Self-pay | Admitting: Family Medicine

## 2024-10-20 ENCOUNTER — Ambulatory Visit: Payer: Self-pay | Admitting: Family Medicine

## 2024-10-20 VITALS — BP 102/62 | HR 62 | Temp 97.7°F | Ht 70.0 in | Wt 169.6 lb

## 2024-10-20 DIAGNOSIS — R739 Hyperglycemia, unspecified: Secondary | ICD-10-CM

## 2024-10-20 DIAGNOSIS — E559 Vitamin D deficiency, unspecified: Secondary | ICD-10-CM

## 2024-10-20 DIAGNOSIS — E538 Deficiency of other specified B group vitamins: Secondary | ICD-10-CM

## 2024-10-20 DIAGNOSIS — Z131 Encounter for screening for diabetes mellitus: Secondary | ICD-10-CM | POA: Diagnosis not present

## 2024-10-20 DIAGNOSIS — G301 Alzheimer's disease with late onset: Secondary | ICD-10-CM

## 2024-10-20 DIAGNOSIS — F02A0 Dementia in other diseases classified elsewhere, mild, without behavioral disturbance, psychotic disturbance, mood disturbance, and anxiety: Secondary | ICD-10-CM | POA: Diagnosis not present

## 2024-10-20 DIAGNOSIS — E785 Hyperlipidemia, unspecified: Secondary | ICD-10-CM | POA: Diagnosis not present

## 2024-10-20 LAB — COMPREHENSIVE METABOLIC PANEL WITH GFR
ALT: 20 U/L (ref 0–53)
AST: 23 U/L (ref 0–37)
Albumin: 4.1 g/dL (ref 3.5–5.2)
Alkaline Phosphatase: 76 U/L (ref 39–117)
BUN: 28 mg/dL — ABNORMAL HIGH (ref 6–23)
CO2: 25 meq/L (ref 19–32)
Calcium: 9 mg/dL (ref 8.4–10.5)
Chloride: 108 meq/L (ref 96–112)
Creatinine, Ser: 1.3 mg/dL (ref 0.40–1.50)
GFR: 46.32 mL/min — ABNORMAL LOW (ref >60.00)
Glucose, Bld: 118 mg/dL — ABNORMAL HIGH (ref 70–99)
Potassium: 4.7 meq/L (ref 3.5–5.1)
Sodium: 140 meq/L (ref 135–145)
Total Bilirubin: 0.8 mg/dL (ref 0.2–1.2)
Total Protein: 6.3 g/dL (ref 6.0–8.3)

## 2024-10-20 LAB — LIPID PANEL
Cholesterol: 117 mg/dL (ref 0–200)
HDL: 34.3 mg/dL — ABNORMAL LOW (ref >39.00)
LDL Cholesterol: 64 mg/dL (ref 0–99)
NonHDL: 82.52
Total CHOL/HDL Ratio: 3
Triglycerides: 94 mg/dL (ref 0.0–149.0)
VLDL: 18.8 mg/dL (ref 0.0–40.0)

## 2024-10-20 LAB — CBC WITH DIFFERENTIAL/PLATELET
Basophils Absolute: 0 K/uL (ref 0.0–0.1)
Basophils Relative: 0.5 % (ref 0.0–3.0)
Eosinophils Absolute: 0.1 K/uL (ref 0.0–0.7)
Eosinophils Relative: 2.9 % (ref 0.0–5.0)
HCT: 37.6 % — ABNORMAL LOW (ref 39.0–52.0)
Hemoglobin: 12.6 g/dL — ABNORMAL LOW (ref 13.0–17.0)
Lymphocytes Relative: 22.9 % (ref 12.0–46.0)
Lymphs Abs: 1.1 K/uL (ref 0.7–4.0)
MCHC: 33.6 g/dL (ref 30.0–36.0)
MCV: 92.4 fl (ref 78.0–100.0)
Monocytes Absolute: 0.5 K/uL (ref 0.1–1.0)
Monocytes Relative: 9.8 % (ref 3.0–12.0)
Neutro Abs: 2.9 K/uL (ref 1.4–7.7)
Neutrophils Relative %: 63.9 % (ref 43.0–77.0)
Platelets: 147 K/uL — ABNORMAL LOW (ref 150.0–400.0)
RBC: 4.07 Mil/uL — ABNORMAL LOW (ref 4.22–5.81)
RDW: 15.3 % (ref 11.5–15.5)
WBC: 4.6 K/uL (ref 4.0–10.5)

## 2024-10-20 LAB — VITAMIN B12: Vitamin B-12: 514 pg/mL (ref 211–911)

## 2024-10-20 LAB — HEMOGLOBIN A1C: Hgb A1c MFr Bld: 6.2 % (ref 4.6–6.5)

## 2024-10-20 LAB — VITAMIN D 25 HYDROXY (VIT D DEFICIENCY, FRACTURES): VITD: 29.77 ng/mL — ABNORMAL LOW (ref 30.00–100.00)

## 2024-10-20 NOTE — Progress Notes (Signed)
 Phone [phone] In person visit   Subjective:   Allen Robinson is a 89 y.o. year old very pleasant male patient who presents for/with See problem oriented charting Chief Complaint  Patient presents with   Medical Management of Chronic Issues    6 month follow up; pt would like to discuss urine retention; concerned about strength and balance;     Past Medical History-  Patient Active Problem List   Diagnosis Date Noted   DNR (do not resuscitate) 04/16/2024    Priority: High   Dementia (HCC) 11/29/2023    Priority: High   Aortic atherosclerosis 05/21/2019    Priority: Medium    Gout 03/14/2017    Priority: Medium    BPH associated with nocturia 03/14/2017    Priority: Medium    Hyperlipidemia 03/14/2017    Priority: Medium    GERD (gastroesophageal reflux disease) 03/14/2017    Priority: Medium    Senile purpura 05/03/2020    Priority: Low   Right rib fracture 05/15/2019    Priority: Low   Hyperglycemia 04/28/2019    Priority: Low   Vitamin D  deficiency 03/14/2017    Priority: Low   Peripheral polyneuropathy 02/16/2016    Priority: Low   Diplopia 11/19/2015    Priority: Low    Medications- reviewed and updated Current Outpatient Medications  Medication Sig Dispense Refill   allopurinol  (ZYLOPRIM ) 100 MG tablet TAKE TWO TABLETS BY MOUTH DAILY TO PREVENT GOUT, NOT FOR ACUTE ATTACKS, MUST TAKE EVERY DAY     colchicine  0.6 MG tablet Take 0.6 mg by mouth as needed.     Cyanocobalamin  (VITAMIN B 12 PO) Take by mouth.     donepezil (ARICEPT) 5 MG tablet Take 5 mg by mouth.     hydrocortisone 2.5 % cream SMARTSIG:sparingly Topical Twice Daily PRN     Multiple Vitamin (MULTIVITAMIN ADULT PO) Take by mouth.     omeprazole (PRILOSEC) 20 MG capsule Take 20 mg by mouth daily as needed.      rosuvastatin  (CRESTOR ) 5 MG tablet Take 1 tablet (5 mg total) by mouth daily. 90 tablet 3   traZODone (DESYREL) 50 MG tablet Take 50 mg by mouth.     No current  facility-administered medications for this visit.     Objective:  BP 102/62 (BP Location: Left Arm, Patient Position: Sitting, Cuff Size: Normal)   Pulse 62   Temp 97.7 F (36.5 C) (Temporal)   Ht 5' 10 (1.778 m)   Wt 169 lb 9.6 oz (76.9 kg)   SpO2 97%   BMI 24.34 kg/m  Gen: NAD, resting comfortably CV: RRR no murmurs rubs or gallops Lungs: CTAB no crackles, wheeze, rhonchi Ext: trace edema Skin: warm, dry     Assessment and Plan   #memory loss- has seen Lauraine Sevin with neurology and now sees [facility] neurology  S: - [facility] started donepezel 5 mg daily -seroquel 25 mg twice daily as needed for agitation- made him too sedated  -Insomnia treated with trazodone 50 mg daily- not taking much  -MRI  brain 07/20/23 largely reassuring other than microvascular ischemis  - he's doing speech therapy and recently completed occupational therapy - due to ongoing falls.  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 Ongoing falls. Just finished therapy and did help with strength some but still having recurrent falls. using walker more consistently.  -[facility]'s- sounds like DAT from June 21, 2018 A/P: he feels not having falls but family reports several still even with therapy. Using walker recgularly  #B12 relative deficiency- taking oral B12  and multivitamin  Lab Results  Component Value Date   VITAMINB12 385 10/15/2023   # Hyperlipidemia/aortic atherosclerosis - incidental finding on prior imaging with ideal LDL goal under 70 S:Medication : Rosuvastatin  5 mg daily  -prior  Simvastatin  40 mg-takes hal  trial off 12/2023 to see if helps memory - didn't help  Lab Results  Component Value Date   CHOL 224 (A) 04/07/2024   HDL 41 04/07/2024   LDLCALC 170 04/07/2024   TRIG 134 04/07/2024   CHOLHDL 3  11/03/2021  A/P: hopefully improved back on meds- update lipid panel today. Continue current meds for now    #Gout-uric acid has been below 6 S: no flares on Allopurinol  200 mg tablet.  Has colchicine  available if needed A/P:stable- continue current medicines  . Hold off on uric acid with no flares   # GERD S:Medication -  Omeprazole 20 mg  A/P: offered to change to Pepcid but hed like to hold steady   #BPH S: Medication: none  - Finasteride  5 mg from the [place], terazosin 2 mg- both stopped with fall risk - prior on Vesicare -Nocturia - gets up 3x a night to urinate in past now up to 6 A/P: ongoing issues- has not had benefit form medications in psat   # Hyperglycemia/insulin resistance/prediabetes-peak recent A1c 6.3 in 2020- update a1c today- hoping stable  #Vitamin D  deficiency S: Medication: multivitamin only in 2024 -in past 10,000 units daily Lab Results  Component Value Date   VD25OH 43.8 08/20/2023  A/P: hopefully stable- update vitamin D  today. Continue current meds for now    # Constipation-sparing MiraLAX if needed.  Colace if needed. No major issues lately   #dermatology treated for recent rash and that seemed to help. Daughter states sun damage   #Upcoming toe x-rays for crossed over toes- wearing diabetes shoes to be cautious  Recommended follow up: Return in about 6 months (around 04/20/2025) for followup or sooner if needed.Schedule b4 you leave. Future Appointments  Date Time Provider Department Center  02/17/2025  3:00 PM [facility] ANNUAL WELLNESS VISIT 1 [facility] [place]   Lab/Order associations: NOT fasting   ICD-10-CM   1. Mild late onset Alzheimer's dementia, unspecified whether behavioral, psychotic, or mood disturbance or anxiety (HCC)  G30.1 Vitamin B12   F02.A0     2. Hyperlipidemia, unspecified hyperlipidemia type  E78.5 Comprehensive metabolic panel with GFR    CBC with Differential/Platelet    Lipid panel    3. Vitamin D  deficiency  E55.9  Vitamin D  (25 hydroxy)    4. B12 deficiency  E53.8 Vitamin B12    5. Screening for diabetes mellitus  Z13.1 Hemoglobin A1c    6. Hyperglycemia  R73.9 Hemoglobin A1c      No orders of the defined types were placed in this encounter.   Return precautions advised.  Garnette Lukes, MD

## 2024-10-20 NOTE — Patient Instructions (Addendum)
 Please stop by lab before you go If you have mychart- we will send your results within 3 business days of us  receiving them.  If you do not have mychart- we will call you about results within 5 business days of us  receiving them.  *please also note that you will see labs on mychart as soon as they post. I will later go in and write notes on them- will say notes from Dr. Katrinka   No changes today unless labs lead us  to make changes  Can call us  if you need refills- [facility] but I'm happy to help if needed  Thrilled you are doing so well overall- wish we could stop those falls for you!   Recommended follow up: Return in about 6 months (around 04/20/2025) for followup or sooner if needed.Schedule b4 you leave.

## 2024-10-21 ENCOUNTER — Other Ambulatory Visit: Payer: Self-pay | Admitting: Family Medicine

## 2024-10-21 DIAGNOSIS — D649 Anemia, unspecified: Secondary | ICD-10-CM

## 2024-11-24 ENCOUNTER — Other Ambulatory Visit

## 2024-11-24 ENCOUNTER — Ambulatory Visit: Payer: Self-pay | Admitting: Family Medicine

## 2024-11-24 DIAGNOSIS — D649 Anemia, unspecified: Secondary | ICD-10-CM | POA: Diagnosis not present

## 2024-11-24 LAB — CBC WITH DIFFERENTIAL/PLATELET
Basophils Absolute: 0 K/uL (ref 0.0–0.1)
Basophils Relative: 0.4 % (ref 0.0–3.0)
Eosinophils Absolute: 0.1 K/uL (ref 0.0–0.7)
Eosinophils Relative: 1.5 % (ref 0.0–5.0)
HCT: 37.9 % — ABNORMAL LOW (ref 39.0–52.0)
Hemoglobin: 12.5 g/dL — ABNORMAL LOW (ref 13.0–17.0)
Lymphocytes Relative: 17.5 % (ref 12.0–46.0)
Lymphs Abs: 1 K/uL (ref 0.7–4.0)
MCHC: 33.1 g/dL (ref 30.0–36.0)
MCV: 93.4 fl (ref 78.0–100.0)
Monocytes Absolute: 0.6 K/uL (ref 0.1–1.0)
Monocytes Relative: 9.7 % (ref 3.0–12.0)
Neutro Abs: 4.1 K/uL (ref 1.4–7.7)
Neutrophils Relative %: 70.9 % (ref 43.0–77.0)
Platelets: 155 K/uL (ref 150.0–400.0)
RBC: 4.05 Mil/uL — ABNORMAL LOW (ref 4.22–5.81)
RDW: 15.8 % — ABNORMAL HIGH (ref 11.5–15.5)
WBC: 5.7 K/uL (ref 4.0–10.5)

## 2024-11-25 ENCOUNTER — Encounter: Payer: Self-pay | Admitting: Family Medicine

## 2025-02-17 ENCOUNTER — Ambulatory Visit

## 2025-04-20 ENCOUNTER — Ambulatory Visit: Admitting: Family Medicine
